# Patient Record
Sex: Female | Born: 1965
Health system: Southern US, Community
[De-identification: ages and names within clinical notes are randomized; demographics above are authoritative.]

## PROBLEM LIST (undated history)

## (undated) DIAGNOSIS — G4730 Sleep apnea, unspecified: Secondary | ICD-10-CM

## (undated) DIAGNOSIS — E785 Hyperlipidemia, unspecified: Secondary | ICD-10-CM

## (undated) DIAGNOSIS — R200 Anesthesia of skin: Secondary | ICD-10-CM

## (undated) DIAGNOSIS — F419 Anxiety disorder, unspecified: Secondary | ICD-10-CM

## (undated) DIAGNOSIS — J4520 Mild intermittent asthma, uncomplicated: Secondary | ICD-10-CM

## (undated) DIAGNOSIS — M545 Low back pain, unspecified: Secondary | ICD-10-CM

## (undated) DIAGNOSIS — J302 Other seasonal allergic rhinitis: Secondary | ICD-10-CM

## (undated) DIAGNOSIS — K219 Gastro-esophageal reflux disease without esophagitis: Secondary | ICD-10-CM

## (undated) DIAGNOSIS — R7303 Prediabetes: Secondary | ICD-10-CM

## (undated) DIAGNOSIS — F329 Major depressive disorder, single episode, unspecified: Secondary | ICD-10-CM

## (undated) DIAGNOSIS — G629 Polyneuropathy, unspecified: Secondary | ICD-10-CM

## (undated) DIAGNOSIS — M5431 Sciatica, right side: Secondary | ICD-10-CM

## (undated) DIAGNOSIS — I10 Essential (primary) hypertension: Secondary | ICD-10-CM

## (undated) DIAGNOSIS — M1990 Unspecified osteoarthritis, unspecified site: Secondary | ICD-10-CM

## (undated) DIAGNOSIS — G8929 Other chronic pain: Secondary | ICD-10-CM

## (undated) DIAGNOSIS — Z973 Presence of spectacles and contact lenses: Secondary | ICD-10-CM

## (undated) DIAGNOSIS — F32A Depression, unspecified: Secondary | ICD-10-CM

## (undated) DIAGNOSIS — J45909 Unspecified asthma, uncomplicated: Secondary | ICD-10-CM

## (undated) HISTORY — DX: Hyperlipidemia, unspecified: E78.5

## (undated) HISTORY — DX: Other seasonal allergic rhinitis: J30.2

## (undated) HISTORY — PX: SHOULDER ARTHROSCOPY: SHX128

---
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
## 1987-03-12 HISTORY — PX: ABDOMINAL HERNIA REPAIR: SHX539

## 1987-03-12 HISTORY — PX: HERNIA REPAIR: SHX51

## 1995-03-12 HISTORY — PX: TUBAL LIGATION: SHX77

## 1997-10-05 ENCOUNTER — Emergency Department (HOSPITAL_COMMUNITY): Admission: EM | Admit: 1997-10-05 | Discharge: 1997-10-05 | Payer: Self-pay | Admitting: Emergency Medicine

## 1998-02-07 ENCOUNTER — Encounter: Payer: Self-pay | Admitting: Emergency Medicine

## 1998-02-07 ENCOUNTER — Emergency Department (HOSPITAL_COMMUNITY): Admission: EM | Admit: 1998-02-07 | Discharge: 1998-02-07 | Payer: Self-pay | Admitting: Emergency Medicine

## 1998-07-06 ENCOUNTER — Other Ambulatory Visit: Admission: RE | Admit: 1998-07-06 | Discharge: 1998-07-06 | Payer: Self-pay | Admitting: Obstetrics

## 1999-05-17 ENCOUNTER — Emergency Department (HOSPITAL_COMMUNITY): Admission: EM | Admit: 1999-05-17 | Discharge: 1999-05-17 | Payer: Self-pay | Admitting: *Deleted

## 2000-02-10 ENCOUNTER — Emergency Department (HOSPITAL_COMMUNITY): Admission: EM | Admit: 2000-02-10 | Discharge: 2000-02-10 | Payer: Self-pay | Admitting: Emergency Medicine

## 2000-02-13 ENCOUNTER — Emergency Department (HOSPITAL_COMMUNITY): Admission: EM | Admit: 2000-02-13 | Discharge: 2000-02-13 | Payer: Self-pay | Admitting: Emergency Medicine

## 2000-04-08 ENCOUNTER — Encounter: Admission: RE | Admit: 2000-04-08 | Discharge: 2000-04-25 | Payer: Self-pay | Admitting: Occupational Medicine

## 2001-08-26 ENCOUNTER — Emergency Department (HOSPITAL_COMMUNITY): Admission: EM | Admit: 2001-08-26 | Discharge: 2001-08-26 | Payer: Self-pay | Admitting: Emergency Medicine

## 2001-08-26 ENCOUNTER — Encounter: Payer: Self-pay | Admitting: Emergency Medicine

## 2001-08-28 ENCOUNTER — Emergency Department (HOSPITAL_COMMUNITY): Admission: EM | Admit: 2001-08-28 | Discharge: 2001-08-28 | Payer: Self-pay | Admitting: Emergency Medicine

## 2003-03-25 ENCOUNTER — Emergency Department (HOSPITAL_COMMUNITY): Admission: EM | Admit: 2003-03-25 | Discharge: 2003-03-25 | Payer: Self-pay | Admitting: Emergency Medicine

## 2003-10-27 ENCOUNTER — Encounter: Admission: RE | Admit: 2003-10-27 | Discharge: 2003-10-27 | Payer: Self-pay | Admitting: Internal Medicine

## 2004-01-09 ENCOUNTER — Emergency Department (HOSPITAL_COMMUNITY): Admission: EM | Admit: 2004-01-09 | Discharge: 2004-01-09 | Payer: Self-pay | Admitting: Emergency Medicine

## 2004-01-09 ENCOUNTER — Ambulatory Visit: Payer: Self-pay | Admitting: *Deleted

## 2004-01-12 ENCOUNTER — Ambulatory Visit: Payer: Self-pay | Admitting: Internal Medicine

## 2004-02-09 ENCOUNTER — Ambulatory Visit: Payer: Self-pay | Admitting: Internal Medicine

## 2004-07-25 ENCOUNTER — Ambulatory Visit: Payer: Self-pay | Admitting: Family Medicine

## 2004-07-27 ENCOUNTER — Ambulatory Visit: Payer: Self-pay | Admitting: Family Medicine

## 2004-08-03 ENCOUNTER — Ambulatory Visit: Payer: Self-pay | Admitting: Family Medicine

## 2004-08-03 ENCOUNTER — Other Ambulatory Visit: Admission: RE | Admit: 2004-08-03 | Discharge: 2004-08-03 | Payer: Self-pay | Admitting: Family Medicine

## 2004-11-17 ENCOUNTER — Emergency Department (HOSPITAL_COMMUNITY): Admission: EM | Admit: 2004-11-17 | Discharge: 2004-11-17 | Payer: Self-pay | Admitting: Emergency Medicine

## 2004-12-04 ENCOUNTER — Ambulatory Visit: Payer: Self-pay | Admitting: Family Medicine

## 2004-12-11 ENCOUNTER — Emergency Department (HOSPITAL_COMMUNITY): Admission: EM | Admit: 2004-12-11 | Discharge: 2004-12-11 | Payer: Self-pay | Admitting: Emergency Medicine

## 2004-12-12 ENCOUNTER — Ambulatory Visit: Payer: Self-pay | Admitting: Family Medicine

## 2005-03-22 ENCOUNTER — Ambulatory Visit: Payer: Self-pay | Admitting: Family Medicine

## 2005-04-22 ENCOUNTER — Ambulatory Visit: Payer: Self-pay | Admitting: Family Medicine

## 2005-05-09 ENCOUNTER — Ambulatory Visit: Payer: Self-pay | Admitting: Family Medicine

## 2005-05-13 ENCOUNTER — Ambulatory Visit: Payer: Self-pay | Admitting: Family Medicine

## 2005-05-24 ENCOUNTER — Ambulatory Visit: Payer: Self-pay | Admitting: Family Medicine

## 2005-06-28 ENCOUNTER — Ambulatory Visit: Payer: Self-pay | Admitting: Internal Medicine

## 2005-07-30 ENCOUNTER — Ambulatory Visit: Payer: Self-pay | Admitting: Family Medicine

## 2005-08-22 ENCOUNTER — Ambulatory Visit: Payer: Self-pay | Admitting: Family Medicine

## 2005-08-22 ENCOUNTER — Encounter: Payer: Self-pay | Admitting: Family Medicine

## 2005-09-03 ENCOUNTER — Encounter: Admission: RE | Admit: 2005-09-03 | Discharge: 2005-09-03 | Payer: Self-pay | Admitting: Family Medicine

## 2005-10-22 ENCOUNTER — Ambulatory Visit: Payer: Self-pay | Admitting: Family Medicine

## 2005-10-26 ENCOUNTER — Emergency Department (HOSPITAL_COMMUNITY): Admission: EM | Admit: 2005-10-26 | Discharge: 2005-10-26 | Payer: Self-pay | Admitting: Emergency Medicine

## 2006-01-29 ENCOUNTER — Ambulatory Visit: Payer: Self-pay | Admitting: Family Medicine

## 2006-04-01 ENCOUNTER — Ambulatory Visit: Payer: Self-pay | Admitting: Family Medicine

## 2006-04-25 ENCOUNTER — Ambulatory Visit (HOSPITAL_COMMUNITY): Admission: RE | Admit: 2006-04-25 | Discharge: 2006-04-25 | Payer: Self-pay | Admitting: Family Medicine

## 2006-10-21 ENCOUNTER — Emergency Department (HOSPITAL_COMMUNITY): Admission: EM | Admit: 2006-10-21 | Discharge: 2006-10-22 | Payer: Self-pay | Admitting: Emergency Medicine

## 2006-10-23 ENCOUNTER — Ambulatory Visit: Payer: Self-pay | Admitting: Family Medicine

## 2006-11-03 ENCOUNTER — Ambulatory Visit: Payer: Self-pay | Admitting: Family Medicine

## 2006-11-03 ENCOUNTER — Telehealth (INDEPENDENT_AMBULATORY_CARE_PROVIDER_SITE_OTHER): Payer: Self-pay | Admitting: Family Medicine

## 2006-11-03 DIAGNOSIS — E785 Hyperlipidemia, unspecified: Secondary | ICD-10-CM | POA: Insufficient documentation

## 2006-11-03 DIAGNOSIS — I10 Essential (primary) hypertension: Secondary | ICD-10-CM

## 2006-11-03 DIAGNOSIS — F411 Generalized anxiety disorder: Secondary | ICD-10-CM | POA: Insufficient documentation

## 2006-11-03 DIAGNOSIS — J45909 Unspecified asthma, uncomplicated: Secondary | ICD-10-CM

## 2006-11-03 LAB — CONVERTED CEMR LAB
ALT: 10 units/L (ref 0–35)
AST: 13 units/L (ref 0–37)
Albumin: 4.3 g/dL (ref 3.5–5.2)
Alkaline Phosphatase: 68 units/L (ref 39–117)
Basophils Absolute: 0.1 10*3/uL (ref 0.0–0.1)
Calcium: 9.9 mg/dL (ref 8.4–10.5)
Chloride: 104 meq/L (ref 96–112)
Creatinine, Ser: 0.8 mg/dL (ref 0.40–1.20)
Eosinophils Absolute: 0.2 10*3/uL (ref 0.0–0.7)
Eosinophils Relative: 3 % (ref 0–5)
Glucose, Bld: 80 mg/dL (ref 70–99)
HCT: 40.2 % (ref 36.0–46.0)
Monocytes Absolute: 0.5 10*3/uL (ref 0.2–0.7)
Monocytes Relative: 9 % (ref 3–11)
Neutrophils Relative %: 36 % — ABNORMAL LOW (ref 43–77)
Platelets: 362 10*3/uL (ref 150–400)
Sodium: 142 meq/L (ref 135–145)
TSH: 0.605 microintl units/mL (ref 0.350–5.50)
Total Protein: 7.3 g/dL (ref 6.0–8.3)

## 2006-11-04 ENCOUNTER — Telehealth (INDEPENDENT_AMBULATORY_CARE_PROVIDER_SITE_OTHER): Payer: Self-pay | Admitting: Family Medicine

## 2006-11-26 ENCOUNTER — Encounter (INDEPENDENT_AMBULATORY_CARE_PROVIDER_SITE_OTHER): Payer: Self-pay | Admitting: *Deleted

## 2006-12-04 ENCOUNTER — Encounter (INDEPENDENT_AMBULATORY_CARE_PROVIDER_SITE_OTHER): Payer: Self-pay | Admitting: Nurse Practitioner

## 2006-12-04 ENCOUNTER — Ambulatory Visit: Payer: Self-pay | Admitting: Family Medicine

## 2006-12-04 ENCOUNTER — Telehealth (INDEPENDENT_AMBULATORY_CARE_PROVIDER_SITE_OTHER): Payer: Self-pay | Admitting: *Deleted

## 2006-12-04 DIAGNOSIS — N390 Urinary tract infection, site not specified: Secondary | ICD-10-CM

## 2006-12-04 LAB — CONVERTED CEMR LAB: Bilirubin Urine: NEGATIVE

## 2006-12-05 ENCOUNTER — Encounter (INDEPENDENT_AMBULATORY_CARE_PROVIDER_SITE_OTHER): Payer: Self-pay | Admitting: Nurse Practitioner

## 2007-01-07 ENCOUNTER — Telehealth (INDEPENDENT_AMBULATORY_CARE_PROVIDER_SITE_OTHER): Payer: Self-pay | Admitting: Family Medicine

## 2007-01-20 ENCOUNTER — Ambulatory Visit: Payer: Self-pay | Admitting: Family Medicine

## 2007-01-20 DIAGNOSIS — R05 Cough: Secondary | ICD-10-CM

## 2007-01-20 LAB — CONVERTED CEMR LAB
Glucose, Urine, Semiquant: NEGATIVE
Protein, U semiquant: NEGATIVE
WBC Urine, dipstick: NEGATIVE

## 2007-01-21 ENCOUNTER — Ambulatory Visit (HOSPITAL_COMMUNITY): Admission: RE | Admit: 2007-01-21 | Discharge: 2007-01-21 | Payer: Self-pay | Admitting: Family Medicine

## 2007-01-30 ENCOUNTER — Ambulatory Visit (HOSPITAL_COMMUNITY): Admission: RE | Admit: 2007-01-30 | Discharge: 2007-01-30 | Payer: Self-pay | Admitting: Family Medicine

## 2007-02-03 ENCOUNTER — Encounter (INDEPENDENT_AMBULATORY_CARE_PROVIDER_SITE_OTHER): Payer: Self-pay | Admitting: Family Medicine

## 2007-02-03 ENCOUNTER — Ambulatory Visit: Payer: Self-pay | Admitting: Family Medicine

## 2007-02-03 DIAGNOSIS — N760 Acute vaginitis: Secondary | ICD-10-CM | POA: Insufficient documentation

## 2007-02-03 LAB — CONVERTED CEMR LAB
Bilirubin Urine: NEGATIVE
Blood in Urine, dipstick: NEGATIVE
GC Probe Amp, Genital: NEGATIVE
Glucose, Urine, Semiquant: NEGATIVE
Ketones, urine, test strip: NEGATIVE
WBC Urine, dipstick: NEGATIVE

## 2007-02-11 ENCOUNTER — Encounter (INDEPENDENT_AMBULATORY_CARE_PROVIDER_SITE_OTHER): Payer: Self-pay | Admitting: Family Medicine

## 2007-05-13 ENCOUNTER — Ambulatory Visit: Payer: Self-pay | Admitting: Family Medicine

## 2007-05-13 ENCOUNTER — Telehealth (INDEPENDENT_AMBULATORY_CARE_PROVIDER_SITE_OTHER): Payer: Self-pay | Admitting: Family Medicine

## 2007-05-13 DIAGNOSIS — M940 Chondrocostal junction syndrome [Tietze]: Secondary | ICD-10-CM | POA: Insufficient documentation

## 2007-08-06 ENCOUNTER — Telehealth (INDEPENDENT_AMBULATORY_CARE_PROVIDER_SITE_OTHER): Payer: Self-pay | Admitting: *Deleted

## 2007-08-07 ENCOUNTER — Ambulatory Visit: Payer: Self-pay | Admitting: Internal Medicine

## 2007-08-07 LAB — CONVERTED CEMR LAB
Bilirubin Urine: NEGATIVE
Ketones, urine, test strip: NEGATIVE
Nitrite: NEGATIVE
Protein, U semiquant: NEGATIVE
Specific Gravity, Urine: <1.005

## 2007-08-08 ENCOUNTER — Encounter (INDEPENDENT_AMBULATORY_CARE_PROVIDER_SITE_OTHER): Payer: Self-pay | Admitting: Internal Medicine

## 2007-08-20 ENCOUNTER — Telehealth (INDEPENDENT_AMBULATORY_CARE_PROVIDER_SITE_OTHER): Payer: Self-pay | Admitting: *Deleted

## 2007-09-25 ENCOUNTER — Telehealth (INDEPENDENT_AMBULATORY_CARE_PROVIDER_SITE_OTHER): Payer: Self-pay | Admitting: *Deleted

## 2007-10-12 ENCOUNTER — Telehealth (INDEPENDENT_AMBULATORY_CARE_PROVIDER_SITE_OTHER): Payer: Self-pay | Admitting: *Deleted

## 2007-11-09 ENCOUNTER — Ambulatory Visit: Payer: Self-pay | Admitting: Family Medicine

## 2007-11-09 DIAGNOSIS — M76899 Other specified enthesopathies of unspecified lower limb, excluding foot: Secondary | ICD-10-CM | POA: Insufficient documentation

## 2007-11-09 DIAGNOSIS — J209 Acute bronchitis, unspecified: Secondary | ICD-10-CM

## 2007-11-25 ENCOUNTER — Encounter (INDEPENDENT_AMBULATORY_CARE_PROVIDER_SITE_OTHER): Payer: Self-pay | Admitting: Family Medicine

## 2007-11-25 ENCOUNTER — Telehealth (INDEPENDENT_AMBULATORY_CARE_PROVIDER_SITE_OTHER): Payer: Self-pay | Admitting: Family Medicine

## 2007-11-27 ENCOUNTER — Telehealth (INDEPENDENT_AMBULATORY_CARE_PROVIDER_SITE_OTHER): Payer: Self-pay | Admitting: *Deleted

## 2007-12-09 ENCOUNTER — Encounter (INDEPENDENT_AMBULATORY_CARE_PROVIDER_SITE_OTHER): Payer: Self-pay | Admitting: *Deleted

## 2007-12-16 ENCOUNTER — Telehealth (INDEPENDENT_AMBULATORY_CARE_PROVIDER_SITE_OTHER): Payer: Self-pay | Admitting: *Deleted

## 2007-12-29 ENCOUNTER — Ambulatory Visit: Payer: Self-pay | Admitting: Family Medicine

## 2008-02-22 ENCOUNTER — Encounter (INDEPENDENT_AMBULATORY_CARE_PROVIDER_SITE_OTHER): Payer: Self-pay | Admitting: Family Medicine

## 2008-02-22 ENCOUNTER — Ambulatory Visit: Payer: Self-pay | Admitting: Family Medicine

## 2008-02-22 DIAGNOSIS — A5901 Trichomonal vulvovaginitis: Secondary | ICD-10-CM

## 2008-02-22 LAB — CONVERTED CEMR LAB
Bilirubin Urine: NEGATIVE
Blood in Urine, dipstick: NEGATIVE
Glucose, Urine, Semiquant: NEGATIVE
Ketones, urine, test strip: NEGATIVE
Nitrite: NEGATIVE

## 2008-02-23 ENCOUNTER — Encounter (INDEPENDENT_AMBULATORY_CARE_PROVIDER_SITE_OTHER): Payer: Self-pay | Admitting: Family Medicine

## 2008-02-23 LAB — CONVERTED CEMR LAB
Chlamydia, DNA Probe: NEGATIVE
GC Probe Amp, Genital: NEGATIVE

## 2008-03-05 ENCOUNTER — Encounter (INDEPENDENT_AMBULATORY_CARE_PROVIDER_SITE_OTHER): Payer: Self-pay | Admitting: Family Medicine

## 2008-03-15 ENCOUNTER — Ambulatory Visit (HOSPITAL_COMMUNITY): Admission: RE | Admit: 2008-03-15 | Discharge: 2008-03-15 | Payer: Self-pay | Admitting: Family Medicine

## 2008-03-29 ENCOUNTER — Telehealth (INDEPENDENT_AMBULATORY_CARE_PROVIDER_SITE_OTHER): Payer: Self-pay | Admitting: *Deleted

## 2008-04-04 ENCOUNTER — Telehealth (INDEPENDENT_AMBULATORY_CARE_PROVIDER_SITE_OTHER): Payer: Self-pay | Admitting: Family Medicine

## 2008-04-06 ENCOUNTER — Ambulatory Visit: Payer: Self-pay | Admitting: Family Medicine

## 2008-08-30 ENCOUNTER — Ambulatory Visit: Payer: Self-pay | Admitting: Internal Medicine

## 2008-08-30 DIAGNOSIS — M545 Low back pain, unspecified: Secondary | ICD-10-CM | POA: Insufficient documentation

## 2008-08-30 DIAGNOSIS — E669 Obesity, unspecified: Secondary | ICD-10-CM

## 2008-09-09 ENCOUNTER — Encounter: Admission: RE | Admit: 2008-09-09 | Discharge: 2008-12-08 | Payer: Self-pay | Admitting: Internal Medicine

## 2008-09-14 ENCOUNTER — Ambulatory Visit: Payer: Self-pay | Admitting: Internal Medicine

## 2008-09-22 ENCOUNTER — Encounter (INDEPENDENT_AMBULATORY_CARE_PROVIDER_SITE_OTHER): Payer: Self-pay | Admitting: Internal Medicine

## 2008-09-27 ENCOUNTER — Encounter (INDEPENDENT_AMBULATORY_CARE_PROVIDER_SITE_OTHER): Payer: Self-pay | Admitting: Internal Medicine

## 2008-09-27 ENCOUNTER — Encounter: Admission: RE | Admit: 2008-09-27 | Discharge: 2008-11-23 | Payer: Self-pay | Admitting: Internal Medicine

## 2008-10-26 ENCOUNTER — Encounter (INDEPENDENT_AMBULATORY_CARE_PROVIDER_SITE_OTHER): Payer: Self-pay | Admitting: Internal Medicine

## 2008-11-29 ENCOUNTER — Encounter (INDEPENDENT_AMBULATORY_CARE_PROVIDER_SITE_OTHER): Payer: Self-pay | Admitting: Internal Medicine

## 2008-12-06 ENCOUNTER — Encounter: Admission: RE | Admit: 2008-12-06 | Discharge: 2009-03-06 | Payer: Self-pay | Admitting: Internal Medicine

## 2008-12-19 ENCOUNTER — Encounter (INDEPENDENT_AMBULATORY_CARE_PROVIDER_SITE_OTHER): Payer: Self-pay | Admitting: Internal Medicine

## 2009-03-17 ENCOUNTER — Emergency Department (HOSPITAL_COMMUNITY): Admission: EM | Admit: 2009-03-17 | Discharge: 2009-03-17 | Payer: Self-pay | Admitting: Family Medicine

## 2009-03-17 ENCOUNTER — Telehealth: Payer: Self-pay | Admitting: Physician Assistant

## 2009-03-22 ENCOUNTER — Encounter (INDEPENDENT_AMBULATORY_CARE_PROVIDER_SITE_OTHER): Payer: Self-pay | Admitting: *Deleted

## 2009-05-09 ENCOUNTER — Ambulatory Visit: Payer: Self-pay | Admitting: Physician Assistant

## 2009-05-09 DIAGNOSIS — J069 Acute upper respiratory infection, unspecified: Secondary | ICD-10-CM | POA: Insufficient documentation

## 2009-05-09 LAB — CONVERTED CEMR LAB
CO2: 27 meq/L (ref 19–32)
Glucose, Bld: 71 mg/dL (ref 70–99)

## 2009-05-11 ENCOUNTER — Encounter: Payer: Self-pay | Admitting: Physician Assistant

## 2009-06-12 ENCOUNTER — Ambulatory Visit: Payer: Self-pay | Admitting: Physician Assistant

## 2009-06-12 DIAGNOSIS — J309 Allergic rhinitis, unspecified: Secondary | ICD-10-CM | POA: Insufficient documentation

## 2009-06-12 DIAGNOSIS — J302 Other seasonal allergic rhinitis: Secondary | ICD-10-CM | POA: Insufficient documentation

## 2009-06-12 LAB — CONVERTED CEMR LAB: LDL Goal: 130 mg/dL

## 2009-06-29 ENCOUNTER — Ambulatory Visit: Payer: Self-pay | Admitting: Physician Assistant

## 2009-07-05 LAB — CONVERTED CEMR LAB
CO2: 26 meq/L (ref 19–32)
Chloride: 105 meq/L (ref 96–112)
Creatinine, Ser: 0.62 mg/dL (ref 0.40–1.20)
Glucose, Bld: 89 mg/dL (ref 70–99)

## 2009-09-13 ENCOUNTER — Ambulatory Visit: Payer: Self-pay | Admitting: Physician Assistant

## 2009-09-13 DIAGNOSIS — N920 Excessive and frequent menstruation with regular cycle: Secondary | ICD-10-CM

## 2009-09-13 DIAGNOSIS — F329 Major depressive disorder, single episode, unspecified: Secondary | ICD-10-CM

## 2009-09-13 LAB — CONVERTED CEMR LAB
Bilirubin Urine: NEGATIVE
Blood in Urine, dipstick: NEGATIVE
KOH Prep: NEGATIVE
Ketones, urine, test strip: NEGATIVE
Pap Smear: NEGATIVE
Urobilinogen, UA: 0.2
WBC Urine, dipstick: NEGATIVE
Whiff Test: POSITIVE
pH: 7.5

## 2009-09-14 LAB — CONVERTED CEMR LAB
ALT: 13 units/L (ref 0–35)
AST: 16 units/L (ref 0–37)
Albumin: 4.4 g/dL (ref 3.5–5.2)
BUN: 11 mg/dL (ref 6–23)
Basophils Relative: 1 % (ref 0–1)
Chloride: 103 meq/L (ref 96–112)
Creatinine, Ser: 0.56 mg/dL (ref 0.40–1.20)
Glucose, Bld: 82 mg/dL (ref 70–99)
Hemoglobin: 13.5 g/dL (ref 12.0–15.0)
MCV: 83.1 fL (ref 78.0–100.0)
Potassium: 3.4 meq/L — ABNORMAL LOW (ref 3.5–5.3)
RBC: 4.73 M/uL (ref 3.87–5.11)
Sodium: 140 meq/L (ref 135–145)
TSH: 0.578 microintl units/mL (ref 0.350–4.500)
Total Bilirubin: 0.4 mg/dL (ref 0.3–1.2)

## 2009-09-19 ENCOUNTER — Encounter: Payer: Self-pay | Admitting: Physician Assistant

## 2009-09-27 ENCOUNTER — Ambulatory Visit (HOSPITAL_COMMUNITY): Admission: RE | Admit: 2009-09-27 | Discharge: 2009-09-27 | Payer: Self-pay | Admitting: Internal Medicine

## 2009-09-28 ENCOUNTER — Ambulatory Visit: Payer: Self-pay | Admitting: Physician Assistant

## 2009-10-10 ENCOUNTER — Ambulatory Visit: Payer: Self-pay | Admitting: Physician Assistant

## 2009-10-10 ENCOUNTER — Ambulatory Visit: Payer: Self-pay | Admitting: Nurse Practitioner

## 2009-10-10 DIAGNOSIS — K219 Gastro-esophageal reflux disease without esophagitis: Secondary | ICD-10-CM | POA: Insufficient documentation

## 2009-10-13 LAB — CONVERTED CEMR LAB
Calcium: 10.2 mg/dL (ref 8.4–10.5)
LDL Cholesterol: 192 mg/dL — ABNORMAL HIGH (ref 0–99)
Sodium: 140 meq/L (ref 135–145)
Total CHOL/HDL Ratio: 5
Triglycerides: 188 mg/dL — ABNORMAL HIGH (ref <150)

## 2009-10-15 ENCOUNTER — Encounter: Payer: Self-pay | Admitting: Physician Assistant

## 2010-01-16 ENCOUNTER — Telehealth (INDEPENDENT_AMBULATORY_CARE_PROVIDER_SITE_OTHER): Payer: Self-pay | Admitting: Internal Medicine

## 2010-01-23 ENCOUNTER — Ambulatory Visit: Payer: Self-pay | Admitting: Internal Medicine

## 2010-02-02 ENCOUNTER — Ambulatory Visit (HOSPITAL_COMMUNITY): Admission: RE | Admit: 2010-02-02 | Discharge: 2010-02-02 | Payer: Self-pay | Admitting: Internal Medicine

## 2010-02-07 DIAGNOSIS — M25569 Pain in unspecified knee: Secondary | ICD-10-CM

## 2010-02-14 ENCOUNTER — Telehealth (INDEPENDENT_AMBULATORY_CARE_PROVIDER_SITE_OTHER): Payer: Self-pay | Admitting: *Deleted

## 2010-02-14 ENCOUNTER — Ambulatory Visit (HOSPITAL_COMMUNITY): Admission: RE | Admit: 2010-02-14 | Payer: Self-pay | Admitting: Internal Medicine

## 2010-02-28 ENCOUNTER — Ambulatory Visit (HOSPITAL_COMMUNITY)
Admission: RE | Admit: 2010-02-28 | Discharge: 2010-02-28 | Payer: Self-pay | Source: Home / Self Care | Attending: Internal Medicine | Admitting: Internal Medicine

## 2010-04-01 ENCOUNTER — Encounter: Payer: Self-pay | Admitting: Internal Medicine

## 2010-04-10 ENCOUNTER — Encounter (INDEPENDENT_AMBULATORY_CARE_PROVIDER_SITE_OTHER): Payer: Self-pay | Admitting: Internal Medicine

## 2010-04-10 NOTE — Letter (Signed)
Summary: *HSN Results Follow up  [facility]  [address] [place], [place] [address]   Phone: [phone]  Fax: [phone]      03/22/2009   SUNDUS PETE [address] [place], [place]  [address]   Dear  Ms. Lyrika [name],                            ____S.Drinkard,FNP   ____D. Gore,FNP       ____B. McPherson,MD   ____V. Rankins,MD    ____E. Mulberry,MD    ____N. Daphine Deutscher, FNP  ____D. Reche Dixon, MD    ____K. Philipp Deputy, MD    ____Other     This letter is to inform you that your recent test(s):  _______Pap Smear    _______Lab Test     _______X-ray    _______ is within acceptable limits  _______ requires a medication change  _______ requires a follow-up lab visit  _______ requires a follow-up visit with your provider   Comments:  We have been trying to reach you.  Please give [place] a call at your earliest convenience.       _________________________________________________________ If you have any questions, please contact our office                     Sincerely,  Armenia Shannon [facility]

## 2010-04-10 NOTE — Assessment & Plan Note (Signed)
Summary: HAS HAD ACOLD FOR 2 WEEKS/COUGH//SS   Vital Signs:  Patient profile:   45 year old female Height:      61 inches Weight:      204 pounds BMI:     38.68 Temp:     98.6 degrees F oral Pulse rate:   93 / minute Pulse rhythm:   regular Resp:     17 per minute BP sitting:   140 / 90  (left arm) Cuff size:   regular  Vitals Entered By: [place] Shannon (May 09, 2009 3:25 PM) CC: pt is here for cold symtoms.... pt says she coughs alot at night and is unable to sleep because of the cough..... pt says she has a head cold also.... pt says she has had this cold for two weeks....    pt says she has used OTC meds.... pt wants flu shot.. Is Patient Diabetic? No Pain Assessment Patient in pain? no       Does patient need assistance? Functional Status Self care Ambulation Normal   Primary Care Provider:  Tereso Newcomer PA-C  CC:  pt is here for cold symtoms.... pt says she coughs alot at night and is unable to sleep because of the cough..... pt says she has a head cold also.... pt says she has had this cold for two weeks....    pt says she has used OTC meds.... pt wants flu shot...  History of Present Illness: Here for URI symptoms x2 weeks +. Previous patient of Dr. Barbaraann Barthel. First meeting.  Works with children. Had viral gastroenteritis 2 weeks ago with URI symptoms. Has been coughing since then. Inhaler does not help. Cough more at night. Sputum yellow. No hemoptysis. No fevers.  No chills. + headaches + frontal sinus pressure on left; took ibuprofen and this is better + dental  + lightheadedness + decreased taste and smell throat sore from coughing  Asthma really no worse since getting sick.    Asthma History    Initial Asthma Severity Rating:    Age range: 12+ years    Symptoms: 0-2 days/week    Nighttime Awakenings: 0-2/month    Interferes w/ normal activity: minor limitations    SABA use (not for EIB): daily    Asthma Severity Assessment: Moderate  Persistent   Problems Prior to Update: 1)  Uri  (ICD-465.9) 2)  Obesity  (ICD-278.00) 3)  Low Back Pain Syndrome  (ICD-724.2) 4)  Screening For Malignant Neoplasm, Cervix  (ICD-V76.2) 5)  Examination, Routine Medical  (ICD-V70.0) 6)  Trichomonal Vaginitis  (ICD-131.01) 7)  Unspecified Breast Screening  (ICD-V76.10) 8)  Acute Bronchitis  (ICD-466.0) 9)  Trochanteric Bursitis, Right  (ICD-726.5) 10)  Uti  (ICD-599.0) 11)  Costochondritis  (ICD-733.6) 12)  Routine Gynecological Examination  (ICD-V72.31) 13)  Bacterial Vaginitis  (ICD-616.10) 14)  Cough  (ICD-786.2) 15)  Infection, Urinary Tract Nos  (ICD-599.0) 16)  Hyperlipidemia  (ICD-272.4) 17)  Hypertension  (ICD-401.9) 18)  Asthma  (ICD-493.90) 19)  Anxiety  (ICD-300.00)  Current Medications (verified): 1)  Benazepril-Hydrochlorothiazide 10-12.5 Mg  Tabs (Benazepril-Hydrochlorothiazide) .Marland Kitchen.. 1 By Mouth Once Daily 2)  Alprazolam 0.5 Mg  Tb24 (Alprazolam) .Marland Kitchen.. 1 To 2 Tabs By Mouth As Needed(Per [facility] or Dr.mckenzie) 3)  Allegra 180 Mg  Tabs (Fexofenadine Hcl) .Marland Kitchen.. 1 By Mouth Once Daily 4)  Nasacort Aq 55 Mcg/act  Aers (Triamcinolone Acetonide(Nasal)) .... Daily 5)  Albuterol 90 Mcg/act  Aers (Albuterol) .... Take 2 Puffs Every 4 To 6 Hours As Needed Wheezing/cough  6)  Flexeril 10 Mg Tabs (Cyclobenzaprine Hcl) .... Take 1 Tablet By Mouth Every 8 Hours As Needed Muscle Spasm 7)  Naprosyn 500 Mg Tabs (Naproxen) .Marland Kitchen.. 1 Two Times A Day As Needed Pain Take With Food 8)  Albuterol Sulfate (2.5 Mg/56ml) 0.083% Nebu (Albuterol Sulfate) .... Use One Ampule in Nebulizer Every 4-6 Hours As Needed For Cough/ Wheezing 9)  Metronidazole 500 Mg Tabs (Metronidazole) .... Take 1 Tablet By Mouth Every 12 Hours X 5 Days  Allergies (verified): No Known Drug Allergies  Past History:  Past Medical History: Last updated: 12/29/2007 insomnia Anxiety Asthma...normal PFTs in 2/08. Hypertension Hyperlipidemia   Impression &  Recommendations:  Problem # 1:  ASTHMA (ICD-493.90) add flovent her symptoms before her URI were consistent with mod persistent cont albuterol f/u in one month  Her updated medication list for this problem includes:    Proventil Hfa 108 (90 Base) Mcg/act Aers (Albuterol sulfate) .Marland Kitchen... 1-2 puffs every 4-6 hours as needed    Albuterol Sulfate (2.5 Mg/3ml) 0.083% Nebu (Albuterol sulfate) ..... Use one ampule in nebulizer every 4-6 hours as needed for cough/ wheezing    Flovent Hfa 110 Mcg/act Aero (Fluticasone propionate  hfa) .Marland Kitchen... 1 puff two times a day  Problem # 2:  URI (ICD-465.9) ? sinusitis will cover with zpak tessalon perles tussionex at bedtime add flovent as above  Her updated medication list for this problem includes:    Allegra 180 Mg Tabs (Fexofenadine hcl) .Marland Kitchen... 1 by mouth once daily    Naprosyn 500 Mg Tabs (Naproxen) .Marland Kitchen... 1 two times a day as needed pain take with food    Tessalon Perles 100 Mg Caps (Benzonatate) .Marland Kitchen... Take 1 tablet by mouth three times a day as needed for cough    Tussionex Pennkinetic Er 8-10 Mg/35ml Lqcr (Chlorpheniramine-hydrocodone) .Marland Kitchen... 1 tsp at bedtime as needed for cough  Problem # 3:  HYPERTENSION (ICD-401.9)  med increased in ED last month check bmet  Her updated medication list for this problem includes:    Benazepril-hydrochlorothiazide 20-12.5 Mg Tabs (Benazepril-hydrochlorothiazide) .Marland Kitchen... Take 1 tablet by mouth once a day  Orders: T-Basic Metabolic Panel [phone])  Complete Medication List: 1)  Benazepril-hydrochlorothiazide 20-12.5 Mg Tabs (Benazepril-hydrochlorothiazide) .... Take 1 tablet by mouth once a day 2)  Alprazolam 0.5 Mg Tb24 (Alprazolam) .Marland Kitchen.. 1 to 2 tabs by mouth as needed(per [facility] or dr.mckenzie) 3)  Allegra 180 Mg Tabs (Fexofenadine hcl) .Marland Kitchen.. 1 by mouth once daily 4)  Nasacort Aq 55 Mcg/act Aers (Triamcinolone acetonide(nasal)) .... Daily 5)  Proventil Hfa 108 (90 Base) Mcg/act Aers (Albuterol sulfate) .Marland Kitchen.. 1-2  puffs every 4-6 hours as needed 6)  Flexeril 10 Mg Tabs (Cyclobenzaprine hcl) .... Take 1 tablet by mouth every 8 hours as needed muscle spasm 7)  Naprosyn 500 Mg Tabs (Naproxen) .Marland Kitchen.. 1 two times a day as needed pain take with food 8)  Albuterol Sulfate (2.5 Mg/60ml) 0.083% Nebu (Albuterol sulfate) .... Use one ampule in nebulizer every 4-6 hours as needed for cough/ wheezing 9)  Metronidazole 500 Mg Tabs (Metronidazole) .... Take 1 tablet by mouth every 12 hours x 5 days 10)  Zithromax Z-pak 250 Mg Tabs (Azithromycin) .... Take as directed 11)  Tessalon Perles 100 Mg Caps (Benzonatate) .... Take 1 tablet by mouth three times a day as needed for cough 12)  Tussionex Pennkinetic Er 8-10 Mg/77ml Lqcr (Chlorpheniramine-hydrocodone) .Marland Kitchen.. 1 tsp at bedtime as needed for cough 13)  Flovent Hfa 110 Mcg/act Aero (Fluticasone propionate  hfa) .Marland KitchenMarland KitchenMarland Kitchen 1  puff two times a day  Patient Instructions: 1)  Please schedule a follow-up appointment in 1 month with Scott or sooner if needed.  2)  Take 650 - 1000 mg of tylenol every 4-6 hours as needed for relief of pain or comfort of fever. Avoid taking more than 4000 mg in a 24 hour period( can cause liver damage in higher doses).  3)  Rinse mouth out well after using the flovent. Prescriptions: PROVENTIL HFA 108 (90 BASE) MCG/ACT AERS (ALBUTEROL SULFATE) 1-2 puffs every 4-6 hours as needed  #1 x 11   Entered and Authorized by:   Tereso Newcomer PA-C   Signed by:   Tereso Newcomer PA-C on 05/09/2009   Method used:   Print then Give to Patient   RxID:   [id number] FLOVENT HFA 110 MCG/ACT AERO (FLUTICASONE PROPIONATE  HFA) 1 puff two times a day  #1 x 11   Entered and Authorized by:   Tereso Newcomer PA-C   Signed by:   Tereso Newcomer PA-C on 05/09/2009   Method used:   Print then Give to Patient   RxID:   [id number] TUSSIONEX PENNKINETIC ER 8-10 MG/5ML LQCR (CHLORPHENIRAMINE-HYDROCODONE) 1 tsp at bedtime as needed for cough  #50 mL x 0   Entered and Authorized by:    Tereso Newcomer PA-C   Signed by:   Tereso Newcomer PA-C on 05/09/2009   Method used:   Print then Give to Patient   RxID:   [id number] TESSALON PERLES 100 MG CAPS (BENZONATATE) Take 1 tablet by mouth three times a day as needed for cough  #30 x 0   Entered and Authorized by:   Tereso Newcomer PA-C   Signed by:   Tereso Newcomer PA-C on 05/09/2009   Method used:   Print then Give to Patient   RxID:   [id number] ZITHROMAX Z-PAK 250 MG TABS (AZITHROMYCIN) take as directed  #1 x 0   Entered and Authorized by:   Tereso Newcomer PA-C   Signed by:   Tereso Newcomer PA-C on 05/09/2009   Method used:   Print then Give to Patient   RxID:   [id number]

## 2010-04-10 NOTE — Letter (Signed)
Summary: Out of Work  [facility]  [address] [place], [place] [address]   Phone: [phone]  Fax: [phone]    May 09, 2009   Employee:  VETRA SHINALL    To Whom It May Concern:   For Medical reasons, please excuse the above named employee from work for the following dates:  Start:   May 09, 2009  End:   May 11, 2009  If you need additional information, please feel free to contact our office.         Sincerely,    Tereso Newcomer PA-C

## 2010-04-10 NOTE — Miscellaneous (Signed)
  Clinical Lists Changes  Problems: Assessed DEPRESSION as comment only - Deborah Knight saw Deborah Knight on 7.21.2011 Deborah Knight noted she would be open to trying an antidepressant  Her updated medication list for this problem includes:    Alprazolam 0.5 Mg Tb24 (Alprazolam) .Marland Kitchen... 1 to 2 tabs by mouth as needed(per [facility] or dr.mckenzie)        Impression & Recommendations:  Problem # 1:  DEPRESSION (ICD-311) Deborah Knight saw Deborah Knight on 7.21.2011 Deborah Knight noted she would be open to trying an antidepressant  Her updated medication list for this problem includes:    Alprazolam 0.5 Mg Tb24 (Alprazolam) .Marland Kitchen... 1 to 2 tabs by mouth as needed(per [facility] or dr.mckenzie)  Complete Medication List: 1)  Hyzaar 100-25 Mg Tabs (Losartan potassium-hctz) .... Take 1 tablet by mouth once a day for blood pressure (benazepril/hctz was d/c'd) 2)  Alprazolam 0.5 Mg Tb24 (Alprazolam) .Marland Kitchen.. 1 to 2 tabs by mouth as needed(per [facility] or dr.mckenzie) 3)  Zyrtec Hives Relief 10 Mg Tabs (Cetirizine hcl) .... Take 1 tablet by mouth once a day for allergies 4)  Nasacort Aq 55 Mcg/act Aers (Triamcinolone acetonide(nasal)) .... Daily 5)  Proventil Hfa 108 (90 Base) Mcg/act Aers (Albuterol sulfate) .Marland Kitchen.. 1-2 puffs every 4-6 hours as needed 6)  Flexeril 10 Mg Tabs (Cyclobenzaprine hcl) .... Take 1 tablet by mouth every 8 hours as needed muscle spasm 7)  Naprosyn 500 Mg Tabs (Naproxen) .Marland Kitchen.. 1 two times a day as needed pain take with food 8)  Albuterol Sulfate (2.5 Mg/61ml) 0.083% Nebu (Albuterol sulfate) .... Use one ampule in nebulizer every 4-6 hours as needed for cough/ wheezing 9)  Advair Diskus 250-50 Mcg/dose Aepb (Fluticasone-salmeterol) .Marland Kitchen.. 1 puff two times a day (pharmacy d/c flovent) 10)  Singulair 10 Mg Tabs (Montelukast sodium) .... Take 1 tablet by mouth once a day for allergies 11)  Pepcid 20 Mg Tabs (Famotidine) .... Take 1 tablet by mouth two times a day 12)  Pravastatin  Sodium 40 Mg Tabs (Pravastatin sodium) .... Take 1 tab by mouth at bedtime

## 2010-04-10 NOTE — Assessment & Plan Note (Signed)
Summary: one month f/u /tmm   Vital Signs:  Patient profile:   45 year old female Weight:      202.25 pounds BMI:     38.35 O2 Sat:      99 % Temp:     99.1 degrees F Pulse rate:   102 / minute Pulse rhythm:   regular Resp:     24 per minute BP sitting:   140 / 92  (left arm) Cuff size:   regular  Vitals Entered By: Chauncy Passy, SMA CC: Pt. is here for a 1 month f/u. Pt. states her meds for allergy/sinus is not working and would like more meds for muscle relaxers. Also, pt. started feeling sick yesterday night  w/vomitting. Also, has been constipated for a couple of months. , Hypertension Management, Lipid Management Is Patient Diabetic? No Pain Assessment Patient in pain? no       Does patient need assistance? Functional Status Self care Ambulation Normal Comments PF: 320 - 280 - 320   Primary Care Provider:  Tereso Newcomer PA-C  CC:  Pt. is here for a 1 month f/u. Pt. states her meds for allergy/sinus is not working and would like more meds for muscle relaxers. Also, pt. started feeling sick yesterday night  w/vomitting. Also, has been constipated for a couple of months. , Hypertension Management, and Lipid Management.  History of Present Illness: Here for f/u.  Asthma and allergies:  Notes sinus pressure in sinus on right.  Typical allergy symptoms.  Using nasacort and allegra.  + sneezing, + itchy/watery eyes.  Noted increased symptoms over last couple weeks when weather started to change.  Worse in evenings.  No fever or chills.  No dental pain.   Asthma History    Asthma Control Assessment:    Age range: 12+ years    Symptoms: 0-2 days/week    Nighttime Awakenings: 0-2/month    Interferes w/ normal activity: no limitations    SABA use (not for EIB): 0-2 days/week    Asthma Control Assessment: Well Controlled  Hypertension History:      She complains of headache, but denies chest pain, syncope, and side effects from treatment.        Positive major  cardiovascular risk factors include hyperlipidemia, hypertension, and family history for ischemic heart disease (males less than 65 years old).  Negative major cardiovascular risk factors include female age less than 39 years old and non-tobacco-user status.    Lipid Management History:      Positive NCEP/ATP III risk factors include family history for ischemic heart disease (males less than 18 years old) and hypertension.  Negative NCEP/ATP III risk factors include female age less than 74 years old and non-tobacco-user status.      Current Medications (verified): 1)  Benazepril-Hydrochlorothiazide 20-12.5 Mg Tabs (Benazepril-Hydrochlorothiazide) .... Take 1 Tablet By Mouth Once A Day 2)  Alprazolam 0.5 Mg  Tb24 (Alprazolam) .Marland Kitchen.. 1 To 2 Tabs By Mouth As Needed(Per [facility] or Dr.mckenzie) 3)  Allegra 180 Mg  Tabs (Fexofenadine Hcl) .Marland Kitchen.. 1 By Mouth Once Daily 4)  Nasacort Aq 55 Mcg/act  Aers (Triamcinolone Acetonide(Nasal)) .... Daily 5)  Proventil Hfa 108 (90 Base) Mcg/act Aers (Albuterol Sulfate) .Marland Kitchen.. 1-2 Puffs Every 4-6 Hours As Needed 6)  Flexeril 10 Mg Tabs (Cyclobenzaprine Hcl) .... Take 1 Tablet By Mouth Every 8 Hours As Needed Muscle Spasm 7)  Naprosyn 500 Mg Tabs (Naproxen) .Marland Kitchen.. 1 Two Times A Day As Needed Pain Take With Food 8)  Albuterol Sulfate (2.5 Mg/32ml) 0.083% Nebu (Albuterol Sulfate) .... Use One Ampule in Nebulizer Every 4-6 Hours As Needed For Cough/ Wheezing 9)  Metronidazole 500 Mg Tabs (Metronidazole) .... Take 1 Tablet By Mouth Every 12 Hours X 5 Days 10)  Zithromax Z-Pak 250 Mg Tabs (Azithromycin) .... Take As Directed 11)  Tessalon Perles 100 Mg Caps (Benzonatate) .... Take 1 Tablet By Mouth Three Times A Day As Needed For Cough 12)  Tussionex Pennkinetic Er 8-10 Mg/49ml Lqcr (Chlorpheniramine-Hydrocodone) .Marland Kitchen.. 1 Tsp At Bedtime As Needed For Cough 13)  Flovent Hfa 110 Mcg/act Aero (Fluticasone Propionate  Hfa) .Marland Kitchen.. 1 Puff Two Times A Day  Allergies (verified): No Known Drug  Allergies  Physical Exam  General:  alert, well-developed, and well-nourished.   Head:  normocephalic and atraumatic.   Eyes:  pupils equal, pupils round, and pupils reactive to light.   Neck:  supple.   Lungs:  normal breath sounds and no wheezes.   Heart:  normal rate and regular rhythm.   Neurologic:  alert & oriented X3 and cranial nerves II-XII intact.   Psych:  normally interactive.     Impression & Recommendations:  Problem # 1:  HYPERTENSION (ICD-401.9) change to benazepril hct 20/25 mg once daily repeat bmet in 2 weeks  Her updated medication list for this problem includes:    Benazepril-hydrochlorothiazide 20-25 Mg Tabs (Benazepril-hydrochlorothiazide) .Marland Kitchen... Take 1 tablet by mouth once a day  Problem # 2:  LOW BACK PAIN SYNDROME (ICD-724.2) chronic problem flexeril helps and she is out of meds has gone through PT  Her updated medication list for this problem includes:    Flexeril 10 Mg Tabs (Cyclobenzaprine hcl) .Marland Kitchen... Take 1 tablet by mouth every 8 hours as needed muscle spasm    Naprosyn 500 Mg Tabs (Naproxen) .Marland Kitchen... 1 two times a day as needed pain take with food  Problem # 3:  ASTHMA (ICD-493.90) better controlled continue current meds  Her updated medication list for this problem includes:    Proventil Hfa 108 (90 Base) Mcg/act Aers (Albuterol sulfate) .Marland Kitchen... 1-2 puffs every 4-6 hours as needed    Albuterol Sulfate (2.5 Mg/67ml) 0.083% Nebu (Albuterol sulfate) ..... Use one ampule in nebulizer every 4-6 hours as needed for cough/ wheezing    Flovent Hfa 110 Mcg/act Aero (Fluticasone propionate  hfa) .Marland Kitchen... 1 puff two times a day    Singulair 10 Mg Tabs (Montelukast sodium) .Marland Kitchen... Take 1 tablet by mouth once a day for allergies  Problem # 4:  ALLERGIC RHINITIS (ICD-477.9) add singulair  Her updated medication list for this problem includes:    Allegra 180 Mg Tabs (Fexofenadine hcl) .Marland Kitchen... 1 by mouth once daily    Nasacort Aq 55 Mcg/act Aers (Triamcinolone  acetonide(nasal)) .Marland Kitchen... Daily  Problem # 5:  HYPERLIPIDEMIA (ICD-272.4) check lipids at CPP  Problem # 6:  Preventive Health Care (ICD-V70.0) schedule cpp get Td, pneumovax and flu shot today  Complete Medication List: 1)  Benazepril-hydrochlorothiazide 20-25 Mg Tabs (Benazepril-hydrochlorothiazide) .... Take 1 tablet by mouth once a day 2)  Alprazolam 0.5 Mg Tb24 (Alprazolam) .Marland Kitchen.. 1 to 2 tabs by mouth as needed(per [facility] or dr.mckenzie) 3)  Allegra 180 Mg Tabs (Fexofenadine hcl) .Marland Kitchen.. 1 by mouth once daily 4)  Nasacort Aq 55 Mcg/act Aers (Triamcinolone acetonide(nasal)) .... Daily 5)  Proventil Hfa 108 (90 Base) Mcg/act Aers (Albuterol sulfate) .Marland Kitchen.. 1-2 puffs every 4-6 hours as needed 6)  Flexeril 10 Mg Tabs (Cyclobenzaprine hcl) .... Take 1 tablet by mouth every 8  hours as needed muscle spasm 7)  Naprosyn 500 Mg Tabs (Naproxen) .Marland Kitchen.. 1 two times a day as needed pain take with food 8)  Albuterol Sulfate (2.5 Mg/13ml) 0.083% Nebu (Albuterol sulfate) .... Use one ampule in nebulizer every 4-6 hours as needed for cough/ wheezing 9)  Metronidazole 500 Mg Tabs (Metronidazole) .... Take 1 tablet by mouth every 12 hours x 5 days 10)  Zithromax Z-pak 250 Mg Tabs (Azithromycin) .... Take as directed 11)  Tessalon Perles 100 Mg Caps (Benzonatate) .... Take 1 tablet by mouth three times a day as needed for cough 12)  Tussionex Pennkinetic Er 8-10 Mg/74ml Lqcr (Chlorpheniramine-hydrocodone) .Marland Kitchen.. 1 tsp at bedtime as needed for cough 13)  Flovent Hfa 110 Mcg/act Aero (Fluticasone propionate  hfa) .Marland Kitchen.. 1 puff two times a day 14)  Singulair 10 Mg Tabs (Montelukast sodium) .... Take 1 tablet by mouth once a day for allergies  Other Orders: Pneumococcal Vaccine (91478) Flu Vaccine 33yrs + (29562) Admin 1st Vaccine (13086) Admin of Any Addtl Vaccine (57846) Admin 1st Vaccine (State) [phone]) Admin of Any Addtl Vaccine (State) (84132G)  Hypertension Assessment/Plan:      The patient's hypertensive risk  group is category B: At least one risk factor (excluding diabetes) with no target organ damage.  Today's blood pressure is 140/92.  Her blood pressure goal is < 140/90.  Lipid Assessment/Plan:      Based on NCEP/ATP III, the patient's risk factor category is "0-1 risk factors".  The patient's lipid goals are as follows: Total cholesterol goal is 200; LDL cholesterol goal is 130; HDL cholesterol goal is 40; Triglyceride goal is 150.     Patient Instructions: 1)  Td shot and pneumovax today. 2)  Flu shot if we have it. 3)  Come in to the lab in 2 weeks for BMET (401.1). 4)  Schedule CPP with Linh Hedberg in 2 months.  Come fasting.  Do not eat or drink anything after midnight the night before except water. 5)  Use saline nose spray at separate times of the day apart from the nasacort. 6)  Try adding singulair to your medicines to see if this helps. 7)  Drink plenty of fluids. 8)  Use clear liquid diet for the next 1-2 days.  Then advance slowly with a BRAT diet (bananas, rice, applesauce and toast). Prescriptions: SINGULAIR 10 MG TABS (MONTELUKAST SODIUM) Take 1 tablet by mouth once a day for allergies  #30 x 5   Entered and Authorized by:   Tereso Newcomer PA-C   Signed by:   Tereso Newcomer PA-C on 06/12/2009   Method used:   Print then Give to Patient   RxID:   [id number] FLEXERIL 10 MG TABS (CYCLOBENZAPRINE HCL) Take 1 tablet by mouth every 8 hours as needed muscle spasm  #30 x 1   Entered and Authorized by:   Tereso Newcomer PA-C   Signed by:   Tereso Newcomer PA-C on 06/12/2009   Method used:   Print then Give to Patient   RxID:   [id number] BENAZEPRIL-HYDROCHLOROTHIAZIDE 20-25 MG TABS (BENAZEPRIL-HYDROCHLOROTHIAZIDE) Take 1 tablet by mouth once a day  #30 x 5   Entered and Authorized by:   Tereso Newcomer PA-C   Signed by:   Tereso Newcomer PA-C on 06/12/2009   Method used:   Print then Give to Patient   RxID:   [id number]    Pneumovax Vaccine    Vaccine Type: Pneumovax    Site:  right deltoid    Mfr: Merck  Dose: 0.5 ml    Route: IM    Given by: Chauncy Passy SMA    Exp. Date: 04/06/2010    Lot #: [id number]    VIS given: 10/07/95 version given June 12, 2009.  Influenza Vaccine    Vaccine Type: Fluvax 3+    Site: left deltoid    Mfr: Sanofi Pasteur    Dose: 0.5 ml    Route: IM    Given by: Chauncy Passy SMA    Exp. Date: 09/07/2009    Lot #: [id number]    VIS given: 10/02/06 version given June 12, 2009.  Flu Vaccine Consent Questions    Do you have a history of severe allergic reactions to this vaccine? no    Any prior history of allergic reactions to egg and/or gelatin? no    Do you have a sensitivity to the preservative Thimersol? no    Do you have a past history of Guillan-Barre Syndrome? no    Do you currently have an acute febrile illness? no    Have you ever had a severe reaction to latex? yes    Vaccine information given and explained to patient? yes    Are you currently pregnant? no

## 2010-04-10 NOTE — Progress Notes (Signed)
Summary: CYCLE IS HEAVY,LOWER BACK PAIN  Phone Note Call from Patient Call back at Home Phone [phone]   Reason for Call: Talk to Nurse Summary of Call: weaver pt. MS INGRAM SAYS THAT HER CYCLE STARTED OF 10/27 AND SHE IS STILL ON. THIS IS THE FIRST TIME THIS HAS HAPPENED, ITS HEAVY AND SHE IS CHANGING CONSTANTLY. SHE IS ALSO HAVING LOWER BACK PAINS DOWN TO HER TAIL BONE AND DOWN HER LEGS. Initial call taken by: Leodis Rains,  January 16, 2010 9:35 AM  Follow-up for Phone Call        Has been flowing since 10/27, has not slowed at all, changing about 6 pads/tampons per day.  Currently medium heavy, with some moderately-sized blood clots.  Has been hot flashes for about a year, but has never flowed like this.  Has had a couple of days where she has been feeling "kind of weak", but not like she's going to faint, denies nausea/vomiting.  Cycle usually lasts 5 days, with the longest at 7 days.  This flow is like her usual cycle, except that it's not ending.  Back/abdominal pain/cramping is still present. Follow-up by: Dutch Quint RN,  January 16, 2010 10:01 AM  Additional Follow-up for Phone Call Additional follow up Details #1::        Pt. needs to come in for CBC, orthostatic (lying and standing only)  BPs and pulses as nurse visit--can discuss with one of the providers about starting medroxyprogesterone and schedule for first available appt.   Otherwise, see if someone cancels for today or tomorrow and get her in with one of the providers Additional Follow-up by: Julieanne Manson MD,  January 17, 2010 12:35 PM    Additional Follow-up for Phone Call Additional follow up Details #2::    Pt. in office.  Dutch Quint RN  January 23, 2010 12:45 PM

## 2010-04-10 NOTE — Progress Notes (Signed)
Summary: MRI APPT   Phone Note Call from Patient   Summary of Call: th  after 2:00 not this week /She is @ work and can not be called she will call backgPt called before 9:00 this morning ,so I was not able to transfer call. she said she needs her MRI scheduled on the 17th after 2:00 can't go this week Pt will try to call back @2 :00 today to talk to [place]  or [place] Initial call taken by: Arta Bruce,  February 14, 2010 8:59 AM  Follow-up for Phone Call        For some reason the message got cut off???/Pt called this morning wanting to talk to [place] or Arna Medici ,but it was before 9:00 and I could not transfer the call.Pt needs MRI appt next maybe the 17th afer 2:00/She will call back today to talk to [place] Or Arna Medici @ 2:00 today Follow-up by: Arta Bruce,  February 14, 2010 9:03 AM  Additional Follow-up for Phone Call Additional follow up Details #1::        i TALK TO PT  AND I TOLD HER THAT I BEENTRYING TO REACH HER SINCE 12-5 AND LEFT MESSAGES AND I RESCHEDULE HER APPT 02-28-10 @ 3PM LVM TO PT WITH HER APPT . Additional Follow-up by: Cheryll Dessert,  February 14, 2010 2:48 PM

## 2010-04-10 NOTE — Letter (Signed)
Summary: *HSN Results Follow up  [facility]  [address] [place], [place] [address]   Phone: [phone]  Fax: [phone]      05/11/2009   Deborah Knight [address] [place], [place]  [address]   Dear  Deborah Knight,                            ____S.Drinkard,FNP   ____D. Gore,FNP       ____B. McPherson,MD   ____V. Rankins,MD    ____E. Mulberry,MD    ____N. Daphine Deutscher, FNP  ____D. Reche Dixon, MD    ____K. Philipp Deputy, MD    __x__S. Alben Spittle, PA-C     This letter is to inform you that your recent test(s):  _______Pap Smear    ___x____Lab Test     _______X-ray    ___x____ is within acceptable limits  _______ requires a medication change  _______ requires a follow-up lab visit  _______ requires a follow-up visit with your provider   Comments:       _________________________________________________________ If you have any questions, please contact our office                     Sincerely,  Tereso Newcomer PA-C [facility]

## 2010-04-10 NOTE — Assessment & Plan Note (Signed)
Summary: 4 WEEK FU ON ASTHMA/BP, BMET//KT   Vital Signs:  Patient profile:   45 year old female Height:      61 inches Weight:      196 pounds BMI:     37.17 O2 Sat:      100 % on Room air Temp:     97.8 degrees F Pulse rate:   78 / minute Pulse rhythm:   regular Resp:     18 per minute BP sitting:   140 / 98  (right arm) Cuff size:   large  Vitals Entered By: [place] Shannon (October 10, 2009 10:53 AM)  O2 Flow:  Room air  Serial Vital Signs/Assessments:  Time      Position  BP       Pulse  Resp  Temp     By                     110/80                         Tereso Newcomer PA-C  CC: follow-up visit asthma, still having couch at night Is Patient Diabetic? No Pain Assessment Patient in pain? no       Does patient need assistance? Functional Status Self care Ambulation Normal Comments peak flow results: 1. 280   2. 330   3.  340   Primary Care Provider:  Tereso Newcomer PA-C  CC:  follow-up visit asthma and still having couch at night.  History of Present Illness: Here for f/u on asthma. Symptoms are no better with restarting the flovent.  She feels like her symptoms are increasing.  She is having more symptoms at night and waking up at night with wheezing.  No fevers.  No sputum production.  Has had a cough and notes cough at night. Depression:  Seeing Marchelle Folks.  Helping.  Emotional today before coming in to see me.   GERD:  Notes occ. epigastric and chest pain.  Sometimes with meals.  Took some type of antacid in the past with relief.  She drinks cold water which makes better.  She notes water brash symptoms.  Asthma History    Asthma Control Assessment:    Age range: 12+ years    Symptoms: >2 days/week    Nighttime Awakenings: 4 or more/week    Interferes w/ normal activity: some limitations    SABA use (not for EIB): several times per day    Asthma Control Assessment: Very Poorly Controlled   Current Medications (verified): 1)  Benazepril-Hydrochlorothiazide 20-25  Mg Tabs (Benazepril-Hydrochlorothiazide) .... Take 1 Tablet By Mouth Once A Day 2)  Alprazolam 0.5 Mg  Tb24 (Alprazolam) .Marland Kitchen.. 1 To 2 Tabs By Mouth As Needed(Per [facility] or Dr.mckenzie) 3)  Zyrtec Hives Relief 10 Mg Tabs (Cetirizine Hcl) .... Take 1 Tablet By Mouth Once A Day For Allergies 4)  Nasacort Aq 55 Mcg/act  Aers (Triamcinolone Acetonide(Nasal)) .... Daily 5)  Proventil Hfa 108 (90 Base) Mcg/act Aers (Albuterol Sulfate) .Marland Kitchen.. 1-2 Puffs Every 4-6 Hours As Needed 6)  Flexeril 10 Mg Tabs (Cyclobenzaprine Hcl) .... Take 1 Tablet By Mouth Every 8 Hours As Needed Muscle Spasm 7)  Naprosyn 500 Mg Tabs (Naproxen) .Marland Kitchen.. 1 Two Times A Day As Needed Pain Take With Food 8)  Albuterol Sulfate (2.5 Mg/22ml) 0.083% Nebu (Albuterol Sulfate) .... Use One Ampule in Nebulizer Every 4-6 Hours As Needed For Cough/ Wheezing 9)  Flovent Hfa 110  Mcg/act Aero (Fluticasone Propionate  Hfa) .Marland Kitchen.. 1 Puff Two Times A Day 10)  Singulair 10 Mg Tabs (Montelukast Sodium) .... Take 1 Tablet By Mouth Once A Day For Allergies  Allergies (verified): No Known Drug Allergies  Family History: no colon, breast or ovarian cancer Family History Hypertension - mom no CAD  Physical Exam  General:  alert, well-developed, and well-nourished.   Head:  normocephalic and atraumatic.   Neck:  supple.   Lungs:  normal breath sounds, no crackles, and no wheezes.   Heart:  normal rate and regular rhythm.   Abdomen:  soft and non-tender.   Neurologic:  alert & oriented X3 and cranial nerves II-XII intact.   Psych:  normally interactive.     Impression & Recommendations:  Problem # 1:  DEPRESSION (ICD-311) f/u with counselor  Her updated medication list for this problem includes:    Alprazolam 0.5 Mg Tb24 (Alprazolam) .Marland Kitchen... 1 to 2 tabs by mouth as needed(per [facility] or dr.mckenzie)  Problem # 2:  ASTHMA (ICD-493.90) Assessment: Deteriorated moving air well on exam but symptoms are not improving . . . sound worse considered  prednisone, but lung exam is good will change to advair continue singulair and zyrtec add H2RA change ACE to ARB  Her updated medication list for this problem includes:    Proventil Hfa 108 (90 Base) Mcg/act Aers (Albuterol sulfate) .Marland Kitchen... 1-2 puffs every 4-6 hours as needed    Albuterol Sulfate (2.5 Mg/10ml) 0.083% Nebu (Albuterol sulfate) ..... Use one ampule in nebulizer every 4-6 hours as needed for cough/ wheezing    Advair Diskus 250-50 Mcg/dose Aepb (Fluticasone-salmeterol) .Marland Kitchen... 1 puff two times a day (pharmacy d/c flovent)    Singulair 10 Mg Tabs (Montelukast sodium) .Marland Kitchen... Take 1 tablet by mouth once a day for allergies  Problem # 3:  HYPERTENSION (ICD-401.9)  ? cough related to or exacerbated by ACE change to hyzaar  Her updated medication list for this problem includes:    Hyzaar 100-25 Mg Tabs (Losartan potassium-hctz) .Marland Kitchen... Take 1 tablet by mouth once a day for blood pressure (benazepril/hctz was d/c'd)  Orders: T-Basic Metabolic Panel ([phone])  Problem # 4:  GERD (ICD-530.81) add pepcid ? if acid contributing to symptoms   Her updated medication list for this problem includes:    Pepcid 20 Mg Tabs (Famotidine) .Marland Kitchen... Take 1 tablet by mouth two times a day  Problem # 5:  HYPERLIPIDEMIA (ICD-272.4)  Orders: T-Lipid Profile ([id number])  Complete Medication List: 1)  Hyzaar 100-25 Mg Tabs (Losartan potassium-hctz) .... Take 1 tablet by mouth once a day for blood pressure (benazepril/hctz was d/c'd) 2)  Alprazolam 0.5 Mg Tb24 (Alprazolam) .Marland Kitchen.. 1 to 2 tabs by mouth as needed(per [facility] or dr.mckenzie) 3)  Zyrtec Hives Relief 10 Mg Tabs (Cetirizine hcl) .... Take 1 tablet by mouth once a day for allergies 4)  Nasacort Aq 55 Mcg/act Aers (Triamcinolone acetonide(nasal)) .... Daily 5)  Proventil Hfa 108 (90 Base) Mcg/act Aers (Albuterol sulfate) .Marland Kitchen.. 1-2 puffs every 4-6 hours as needed 6)  Flexeril 10 Mg Tabs (Cyclobenzaprine hcl) .... Take 1 tablet by mouth every 8  hours as needed muscle spasm 7)  Naprosyn 500 Mg Tabs (Naproxen) .Marland Kitchen.. 1 two times a day as needed pain take with food 8)  Albuterol Sulfate (2.5 Mg/46ml) 0.083% Nebu (Albuterol sulfate) .... Use one ampule in nebulizer every 4-6 hours as needed for cough/ wheezing 9)  Advair Diskus 250-50 Mcg/dose Aepb (Fluticasone-salmeterol) .Marland Kitchen.. 1 puff two times a day (pharmacy  d/c flovent) 10)  Singulair 10 Mg Tabs (Montelukast sodium) .... Take 1 tablet by mouth once a day for allergies 11)  Pepcid 20 Mg Tabs (Famotidine) .... Take 1 tablet by mouth two times a day  Patient Instructions: 1)  Stop taking Flovent. 2)  Use Advair two times a day for your asthma. 3)  I gave you samples and sent a prescription to [facility].  Remember these are only good for one week. 4)  I want you to switch from Benazepril/HCTZ to Hyzaar.  I have sent the prescription to [facility].  When you get it, stop the benazepril and start on the Hyzaar for your blood pressure.   5)  Return to the office 2 weeks after changing blood pressure medicines for a blood pressure check and BMET (401.1). 6)  Start taking Pepcid for your stomach two times a day.  I sent this prescription to [facility].   7)  Schedule follow up in 4 weeks with Lorin Picket for asthma.  Return sooner if you feel worse or you are not getting better. Prescriptions: ADVAIR DISKUS 250-50 MCG/DOSE AEPB (FLUTICASONE-SALMETEROL) 1 puff two times a day (pharmacy d/c flovent)  #1 x 5   Entered and Authorized by:   Tereso Newcomer PA-C   Signed by:   Tereso Newcomer PA-C on 10/10/2009   Method used:   Faxed to ...       [facility] (retail)       [address] [place], [place]  [address]       Ph: [phone]       Fax: [phone]   RxID:   [id number] PEPCID 20 MG TABS (FAMOTIDINE) Take 1 tablet by mouth two times a day  #60 x 3   Entered and Authorized by:   Tereso Newcomer PA-C   Signed by:   Tereso Newcomer PA-C on 10/10/2009    Method used:   Faxed to ...       [facility] (retail)       [address] [place], [place]  [address]       Ph: [phone]       Fax: [phone]   RxID:   [id number] HYZAAR 100-25 MG TABS (LOSARTAN POTASSIUM-HCTZ) Take 1 tablet by mouth once a day for blood pressure (benazepril/hctz was d/c'd)  #30 x 5   Entered and Authorized by:   Tereso Newcomer PA-C   Signed by:   Tereso Newcomer PA-C on 10/10/2009   Method used:   Faxed to ...       [facility] (retail)       [address] [place], [place]  [address]       Ph: [phone]       Fax: [phone]   RxID:   [id number]

## 2010-04-10 NOTE — Letter (Signed)
Summary: PSYCHOLOGY SUMMARY  PSYCHOLOGY SUMMARY   Imported By: Arta Bruce 11/01/2009 12:28:34  _____________________________________________________________________  External Attachment:    Type:   Image     Comment:   External Document

## 2010-04-10 NOTE — Progress Notes (Signed)
°  Phone Note Call from Patient Call back at [facility] Phone [phone]   Caller: Patient Summary of Call: The patient wants to get a dental referral but i mentioned that we are not referral at this moment because there is a long waiting list.  The patient wants that I still send the message. Dr. Barbaraann Barthel Initial call taken by: Manon Hilding,  November 25, 2007 9:17 AM  Follow-up for Phone Call        patient was seen at dental clinic a couple of times around 2008 and 2009 and I explianed to her that I will be sending over another ref. Follow-up by: Leodis Rains,  November 25, 2007 11:41 AM

## 2010-04-10 NOTE — Progress Notes (Signed)
  Phone Note Call from Patient Call back at [facility] Phone [phone] Call back at [phone]   Summary of Call: The pt wants to be seen today if that is possible because her bp is very high . The bottom level was around 107 and she has bad headache.  The pt had been taking ibuprofen and tyleno but the medicationsl are not working for her.  Pt had been taking her bp pills too. [facility]    Initial call taken by: Manon Hilding,  March 17, 2009 8:07 AM  Follow-up for Phone Call        left message to return call...........Marland KitchenMikey College CMA  March 17, 2009 5:01 PM    Left message on answering machine for pt to call back.Marland KitchenMarland KitchenMarland KitchenArmenia Shannon  March 21, 2009 12:16 PM   Left message on answering machine for pt to call back.Marland Kitchen...will mail letter......Marland KitchenArmenia Shannon  March 22, 2009 10:03 AM

## 2010-04-10 NOTE — Letter (Signed)
Summary: *HSN Results Follow up  [facility]  [address] [place], [place] [address]   Phone: [phone]  Fax: [phone]      09/19/2009   LARUA COLLIER [address] [place], [place]  [address]   Dear  Ms. Tameisha [name],                            ____S.Drinkard,FNP   ____D. Gore,FNP       ____B. McPherson,MD   ____V. Rankins,MD    ____E. Mulberry,MD    ____N. Daphine Deutscher, FNP  ____D. Reche Dixon, MD    ____K. Philipp Deputy, MD    __x__S. Alben Spittle, PA-C     This letter is to inform you that your recent test(s):  ___x____Pap Smear  is normal.  ___x____Lab Test   requires a follow up lab visit.  Please contact the office.   _______X-ray      Comments:       _________________________________________________________ If you have any questions, please contact our office                     Sincerely,  Tereso Newcomer PA-C [facility]

## 2010-04-10 NOTE — Assessment & Plan Note (Signed)
Summary: CPP EXAM/////GK   Vital Signs:  Patient profile:   45 year old female Height:      61 inches Weight:      200 pounds Temp:     98.3 degrees F oral Pulse rate:   83 / minute Pulse rhythm:   regular Resp:     18 per minute BP sitting:   143 / 92  (left arm) Cuff size:   regular  Vitals Entered By: Armenia Shannon (September 13, 2009 2:59 PM) CC: Hypertension Management Comments PF   1.    290     2. 320        3. 220   Primary Care Provider:  Tereso Newcomer PA-C  CC:  Hypertension Management.  History of Present Illness: Here for CPP.  Health Maint:  LMP 09/07/2009 No h/o abnormal pap. Had 2 periods last month.  Notes heavy menses with clotting and cramping.  No trouble with pregnancy in past.  Notes dyspareunia at times. Does miss some months.   She is sexually active.  One partner.  No STD concerns.  No vaginal discharge or burning or itching. Due for mammo. PHQ9=10.  Tearful during interview.  Lost job in March after 17 years.  Did answer 1 to question 9.  With further questioning, she denies having a plan for suicide.  No further thoughts of violence toward self.  No homicidal thoughts.    Asthma History    Asthma Control Assessment:    Age range: 12+ years    Symptoms: >2 days/week    Nighttime Awakenings: 1-3/week    Interferes w/ normal activity: some limitations    SABA use (not for EIB): >2 days/week    Asthma Control Assessment: Not Well Controlled  Hypertension History:      She denies headache and chest pain.        Positive major cardiovascular risk factors include hyperlipidemia, hypertension, and family history for ischemic heart disease (males less than 67 years old).  Negative major cardiovascular risk factors include female age less than 47 years old and non-tobacco-user status.      Habits & Providers  Alcohol-Tobacco-Diet     Tobacco Status: quit  Exercise-Depression-Behavior     Drug Use: no  Problems Prior to Update: 1)  Menorrhagia   (ICD-626.2) 2)  Depression  (ICD-311) 3)  Allergic Rhinitis  (ICD-477.9) 4)  Uri  (ICD-465.9) 5)  Obesity  (ICD-278.00) 6)  Low Back Pain Syndrome  (ICD-724.2) 7)  Screening For Malignant Neoplasm, Cervix  (ICD-V76.2) 8)  Examination, Routine Medical  (ICD-V70.0) 9)  Trichomonal Vaginitis  (ICD-131.01) 10)  Unspecified Breast Screening  (ICD-V76.10) 11)  Acute Bronchitis  (ICD-466.0) 12)  Trochanteric Bursitis, Right  (ICD-726.5) 13)  Uti  (ICD-599.0) 14)  Costochondritis  (ICD-733.6) 15)  Routine Gynecological Examination  (ICD-V72.31) 16)  Bacterial Vaginitis  (ICD-616.10) 17)  Cough  (ICD-786.2) 18)  Infection, Urinary Tract Nos  (ICD-599.0) 19)  Hyperlipidemia  (ICD-272.4) 20)  Hypertension  (ICD-401.9) 21)  Asthma  (ICD-493.90) 22)  Anxiety  (ICD-300.00)  Allergies: No Known Drug Allergies  Past History:  Past Medical History: Last updated: 12/29/2007 insomnia Anxiety Asthma...normal PFTs in 2/08. Hypertension Hyperlipidemia  Past Surgical History: Tubal ligation 1997 PFTs 2/08 normal s/p hernia repair 1980s  Family History: no colon, breast or ovarian cancer Family History Hypertension - mom  Social History: Former Smoker Alcohol use-yes  a. 1-2 glasses of wine 1-2 times a week Drug use-no 4 kids Divorced  Review  of Systems      See HPI CV:  Denies chest pain or discomfort and fainting. Resp:  Complains of cough and wheezing. GI:  Denies bloody stools and dark tarry stools. GU:  Denies hematuria. MS:  Complains of low back pain. Derm:  Denies rash. Neuro:  Denies headaches. Psych:  Complains of depression; denies suicidal thoughts/plans and thoughts of violence.  Physical Exam  General:  alert, well-developed, and well-nourished.   Head:  normocephalic and atraumatic.   Eyes:  pupils equal, pupils round, pupils reactive to light, and no optic disk abnormalities.   Ears:  R ear normal and L ear normal.   Nose:  no external deformity.     Mouth:  pharynx pink and moist, no erythema, and no exudates.   Neck:  supple, no thyromegaly, and no cervical lymphadenopathy.   Breasts:  skin/areolae normal, no masses, no abnormal thickening, no nipple discharge, no tenderness, and no adenopathy.   Lungs:  normal breath sounds, no crackles, and no wheezes.   Heart:  normal rate, regular rhythm, and no murmur.   Abdomen:  soft, non-tender, and no hepatomegaly.   Rectal:  no external abnormalities.   Genitalia:  normal introitus, no external lesions, no vaginal discharge, mucosa pink and moist, no vaginal or cervical lesions, no vaginal atrophy, and no friaility or hemorrhage.   body habitus difficult to palpate fundus and adnexae  Msk:  normal ROM.   Pulses:  R posterior tibial normal, R dorsalis pedis normal, L posterior tibial normal, and L dorsalis pedis normal.   Extremities:  no edema  Neurologic:  alert & oriented X3 and cranial nerves II-XII intact.   Skin:  turgor normal.   Psych:  normally interactive and tearful.     Impression & Recommendations:  Problem # 1:  DEPRESSION (ICD-311) refer to counselor may go back to [facility] if needed not really interested in going on SSRI going to her psych is too expensive denies any current plans for suicide or ideations  Her updated medication list for this problem includes:    Alprazolam 0.5 Mg Tb24 (Alprazolam) .Marland Kitchen... 1 to 2 tabs by mouth as needed(per [facility] or dr.mckenzie)  Orders: Psychology Referral (Psychology)  Problem # 2:  HYPERTENSION (ICD-401.9) up today may be from stress check BP in 3 weeks add amlodipine if still up   Her updated medication list for this problem includes:    Benazepril-hydrochlorothiazide 20-25 Mg Tabs (Benazepril-hydrochlorothiazide) .Marland Kitchen... Take 1 tablet by mouth once a day  Orders: UA Dipstick w/o Micro (manual) (40347) T-Comprehensive Metabolic Panel (42595-63875)  Problem # 3:  HYPERLIPIDEMIA (ICD-272.4) schedule  FLP  Orders: T-Comprehensive Metabolic Panel ([id number])  Problem # 4:  MENORRHAGIA (ICD-626.2) check FSH if up, may be perimenopausal if not and continues to have, consider vaginal u/s +/- referral to GYN for options not a good candidate for OCPs with HTN and age review at f/u OV  Orders: T-CBC w/Diff (41660-63016) T-TSH [phone]) T-FSH (32202-54270)  Problem # 5:  ASTHMA (ICD-493.90) Assessment: Deteriorated get back on flovent  get back on antihistamine f/u in 4-6 weeks  Her updated medication list for this problem includes:    Proventil Hfa 108 (90 Base) Mcg/act Aers (Albuterol sulfate) .Marland Kitchen... 1-2 puffs every 4-6 hours as needed    Albuterol Sulfate (2.5 Mg/12ml) 0.083% Nebu (Albuterol sulfate) ..... Use one ampule in nebulizer every 4-6 hours as needed for cough/ wheezing    Flovent Hfa 110 Mcg/act Aero (Fluticasone propionate  hfa) .Marland Kitchen... 1 puff two  times a day    Singulair 10 Mg Tabs (Montelukast sodium) .Marland Kitchen... Take 1 tablet by mouth once a day for allergies  Problem # 6:  EXAMINATION, ROUTINE MEDICAL (ICD-V70.0)  Orders: KOH/ WET Mount [phone]) T-HIV Antibody  (Reflex) [id number]) T-TSH ([phone]) T-Pap Smear, Thin Prep (57846) T- GC Chlamydia (96295) Mammogram (Screening) (Mammo)  Problem # 7:  TRICHOMONAL VAGINITIS (ICD-131.01)  Complete Medication List: 1)  Benazepril-hydrochlorothiazide 20-25 Mg Tabs (Benazepril-hydrochlorothiazide) .... Take 1 tablet by mouth once a day 2)  Alprazolam 0.5 Mg Tb24 (Alprazolam) .Marland Kitchen.. 1 to 2 tabs by mouth as needed(per [facility] or dr.mckenzie) 3)  Zyrtec Hives Relief 10 Mg Tabs (Cetirizine hcl) .... Take 1 tablet by mouth once a day for allergies 4)  Nasacort Aq 55 Mcg/act Aers (Triamcinolone acetonide(nasal)) .... Daily 5)  Proventil Hfa 108 (90 Base) Mcg/act Aers (Albuterol sulfate) .Marland Kitchen.. 1-2 puffs every 4-6 hours as needed 6)  Flexeril 10 Mg Tabs (Cyclobenzaprine hcl) .... Take 1 tablet by mouth every 8 hours as needed  muscle spasm 7)  Naprosyn 500 Mg Tabs (Naproxen) .Marland Kitchen.. 1 two times a day as needed pain take with food 8)  Albuterol Sulfate (2.5 Mg/80ml) 0.083% Nebu (Albuterol sulfate) .... Use one ampule in nebulizer every 4-6 hours as needed for cough/ wheezing 9)  Flovent Hfa 110 Mcg/act Aero (Fluticasone propionate  hfa) .Marland Kitchen.. 1 puff two times a day 10)  Singulair 10 Mg Tabs (Montelukast sodium) .... Take 1 tablet by mouth once a day for allergies 11)  Flagyl 500 Mg Tabs (Metronidazole) .... Take 1 tablet by mouth two times a day  Hypertension Assessment/Plan:      The patient's hypertensive risk group is category B: At least one risk factor (excluding diabetes) with no target organ damage.  Today's blood pressure is 143/92.  Her blood pressure goal is < 140/90.  Patient Instructions: 1)  Lab visit with RN for BP check in 3 weeks. 2)  Schedule appointment with Ethelene Browns. 3)  Schedule fasting lipids. 4)  Please schedule a follow-up appointment in 4-6 weeks with Maydell Knoebel for asthma, blood pressure.  5)  Take Flagyl (Metronidazole) until all gone. 6)  Refrain from sex for 7 days after treatment.  Your partner must be treated as well to prevent reinfection. 7)  Get back on Flovent. 8)  Start Zyrtec in addition to your other medicines. Prescriptions: FLAGYL 500 MG TABS (METRONIDAZOLE) Take 1 tablet by mouth two times a day  #14 x 0   Entered and Authorized by:   Tereso Newcomer PA-C   Signed by:   Tereso Newcomer PA-C on 09/13/2009   Method used:   Print then Give to Patient   RxID:   [id number] FLOVENT HFA 110 MCG/ACT AERO (FLUTICASONE PROPIONATE  HFA) 1 puff two times a day  #1 x 11   Entered and Authorized by:   Tereso Newcomer PA-C   Signed by:   Tereso Newcomer PA-C on 09/13/2009   Method used:   Print then Give to Patient   RxID:   [id number] ZYRTEC HIVES RELIEF 10 MG TABS (CETIRIZINE HCL) Take 1 tablet by mouth once a day for allergies  #30 x 5   Entered and Authorized by:   Tereso Newcomer PA-C    Signed by:   Tereso Newcomer PA-C on 09/13/2009   Method used:   Print then Give to Patient   RxID:   [id number]   Laboratory Results   Urine Tests  Date/Time Received: September 13, 2009 3:41 PM  Routine Urinalysis   Color: lt. yellow Appearance: Clear Glucose: negative   (Normal Range: Negative) Bilirubin: negative   (Normal Range: Negative) Ketone: negative   (Normal Range: Negative) Spec. Gravity: 1.015   (Normal Range: 1.003-1.035) Blood: negative   (Normal Range: Negative) pH: 7.5   (Normal Range: 5.0-8.0) Protein: negative   (Normal Range: Negative) Urobilinogen: 0.2   (Normal Range: 0-1) Nitrite: negative   (Normal Range: Negative) Leukocyte Esterace: negative   (Normal Range: Negative)      Wet Mount Source: vaginal WBC/hpf: 10-20 Bacteria/hpf: 1+ Clue cells/hpf: moderate  Positive whiff Yeast/hpf: none Wet Mount KOH: Negative Trichomonas/hpf: moderate

## 2010-04-10 NOTE — Assessment & Plan Note (Signed)
**Note De-Identified Deborah Knight Obfuscation** Summary: abscess? on one leg//mc   Vital Signs:  Patient profile:   45 year old female Menstrual status:  irregular LMP:     01/02/2010 Weight:      199.50 pounds O2 Sat:      100 % on Room air Temp:     97.1 degrees F oral Pulse rate:   76 / minute Resp:     24 per minute BP sitting:   134 / 96  (left arm) Cuff size:   regular  Vitals Entered By: Hale Drone CMA (January 23, 2010 4:24 PM)  O2 Flow:  Room air CC: Here for an abscess on right leg. First noticed on 01/19/10 and it is tender. It is uncomfortable especially when working and walking for long periods of time. Needs refill on Naproxen, Zyrtec and Pepcid Is Patient Diabetic? No Pain Assessment Patient in pain? no       Does patient need assistance? Functional Status Self care Ambulation Normal LMP (date): 01/02/2010 LMP - Character: normal Menarche (age onset years): 13    Menstrual flow (days): 4-5 Menstrual Status irregular Enter LMP: 01/02/2010 Last PAP Result NEGATIVE FOR INTRAEPITHELIAL LESIONS OR MALIGNANCY.   Serial Vital Signs/Assessments:                                PEF    PreRx  PostRx Time      O2 Sat  O2 Type     L/min  L/min  L/min   By           100 %   Room air                          Hale Drone CMA  Comments: Peak Flow: 350 - 370 - 410 By: Hale Drone CMA    Primary Care Provider:  Tereso Newcomer PA-C  CC:  Here for an abscess on right leg. First noticed on 01/19/10 and it is tender. It is uncomfortable especially when working and walking for long periods of time. Needs refill on Naproxen and Zyrtec and Pepcid.  History of Present Illness: 1.  Pain in right leg on regular basis.  Was massaging Icy Hot behind knee on 11/11 and felt a knot.  Could not see anything on outside.  May have enlarged and may have increased tenderness in area since Friday.  No fever.  Allergies (verified): No Known Drug Allergies  Physical Exam  Skin:  2 X 1 cm cystic lesion that moves fairly freely in  subcutaneous fat of medial knee, though feels a bit tethered to underlying tissue.  No fluctuance, no overlying erythema.  Mildly tender with deep palpation.  Less defined similar lesion on the left in same area.   Impression & Recommendations:  Problem # 1:  PROBABLE SEBACEOUS CYST (ICD-706.2) My only concern is the possible tethering of lesion to underlying tissue--visualize with ultrasound Orders: Ultrasound (Ultrasound)  Complete Medication List: 1)  Hyzaar 100-25 Mg Tabs (Losartan potassium-hctz) .... Take 1 tablet by mouth once a day for blood pressure (benazepril/hctz was d/c'd) 2)  Alprazolam 0.5 Mg Tb24 (Alprazolam) .Marland Kitchen.. 1 to 2 tabs by mouth as needed(per [facility] or dr.mckenzie) 3)  Zyrtec Hives Relief 10 Mg Tabs (Cetirizine hcl) .... Take 1 tablet by mouth once a day for allergies 4)  Nasacort Aq 55 Mcg/act Aers (Triamcinolone acetonide(nasal)) .... Daily 5)  Proventil Hfa 108 (90 Base) **Note De-Identified Dejay Kronk Obfuscation** Mcg/act Aers (Albuterol sulfate) .Marland Kitchen.. 1-2 puffs every 4-6 hours as needed 6)  Flexeril 10 Mg Tabs (Cyclobenzaprine hcl) .... Take 1 tablet by mouth every 8 hours as needed muscle spasm 7)  Naprosyn 500 Mg Tabs (Naproxen) .Marland Kitchen.. 1 two times a day as needed pain take with food 8)  Albuterol Sulfate (2.5 Mg/86ml) 0.083% Nebu (Albuterol sulfate) .... Use one ampule in nebulizer every 4-6 hours as needed for cough/ wheezing 9)  Advair Diskus 250-50 Mcg/dose Aepb (Fluticasone-salmeterol) .Marland Kitchen.. 1 puff two times a day (pharmacy d/c flovent) 10)  Singulair 10 Mg Tabs (Montelukast sodium) .... Take 1 tablet by mouth once a day for allergies 11)  Pepcid 20 Mg Tabs (Famotidine) .... Take 1 tablet by mouth two times a day 12)  Pravastatin Sodium 40 Mg Tabs (Pravastatin sodium) .... Take 1 tab by mouth at bedtime  Other Orders: Flu Vaccine 89yrs + ([address]) Admin 1st Vaccine (60454)  Patient Instructions: 1)  Leave the cyst area alone Prescriptions: PEPCID 20 MG TABS (FAMOTIDINE) Take 1 tablet by mouth two times a  day  #60 x 3   Entered and Authorized by:   Julieanne Manson MD   Signed by:   Julieanne Manson MD on 01/23/2010   Method used:   Faxed to ...       [facility] (retail)       [address] [place], [place]  [address]       Ph: [phone]       Fax: [phone]   RxID:   [id number] NAPROSYN 500 MG TABS (NAPROXEN) 1 two times a day as needed pain Take with food  #60 x 2   Entered and Authorized by:   Julieanne Manson MD   Signed by:   Julieanne Manson MD on 01/23/2010   Method used:   Faxed to ...       [facility] (retail)       [address] [place], [place]  [address]       Ph: [phone]       Fax: [phone]   RxID:   [id number] ZYRTEC HIVES RELIEF 10 MG TABS (CETIRIZINE HCL) Take 1 tablet by mouth once a day for allergies  #30 x 5   Entered and Authorized by:   Julieanne Manson MD   Signed by:   Julieanne Manson MD on 01/23/2010   Method used:   Faxed to ...       [facility] (retail)       [address] [place], [place]  [address]       Ph: [phone]       Fax: [phone]   RxID:   [id number]    Orders Added: 1)  Flu Vaccine 77yrs + [90658] 2)  Admin 1st Vaccine [90471] 3)  Ultrasound [Ultrasound] 4)  Est. Patient Level II [54627]   Immunizations Administered:  Influenza Vaccine # 1:    Vaccine Type: Fluvax 3+    Site: left deltoid    Mfr: GlaxoSmithKline    Dose: 0.5 ml    Route: IM    Given by: Hale Drone CMA    Exp. Date: 09/08/2010    Lot #: [id number]    VIS given: 10/03/09 version given January 23, 2010.  Flu Vaccine Consent Questions:    Do you have a history **Note De-Identified Nilson Tabora Obfuscation** of severe allergic reactions to this vaccine? no    Any prior history of allergic reactions to egg and/or gelatin? no    Do you have a sensitivity to the preservative Thimersol? no    Do you have a past history of  Guillan-Barre Syndrome? no    Do you currently have an acute febrile illness? no    Have you ever had a severe reaction to latex? yes    Vaccine information given and explained to patient? yes    Are you currently pregnant? no   Immunizations Administered:  Influenza Vaccine # 1:    Vaccine Type: Fluvax 3+    Site: left deltoid    Mfr: GlaxoSmithKline    Dose: 0.5 ml    Route: IM    Given by: Hale Drone CMA    Exp. Date: 09/08/2010    Lot #: [id number]    VIS given: 10/03/09 version given January 23, 2010.

## 2010-04-18 NOTE — Letter (Signed)
Summary: *HSN Results Follow up  [facility]  [address] [place], [place] [address]   Phone: [phone]  Fax: [phone]      04/10/2010   KAMREN HESKETT [address] [place], [place]  [address]   Dear  Ms. Aleisha [name],                            ____S.Drinkard,FNP   ____D. Gore,FNP       ____B. McPherson,MD   ____V. Rankins,MD    _X___E. Karoline Fleer,MD    ____N. Daphine Deutscher, FNP  ____D. Reche Dixon, MD    ____K. Philipp Deputy, MD    ____Other     This letter is to inform you that your recent test(s):  _______Pap Smear    _______Lab Test     ___X____X-ray    _______ is within acceptable limits  _______ requires a medication change  _______ requires a follow-up lab visit  ___X___ requires a follow-up visit with your provider   Comments:  I need to discuss your MRI with you.  Please call and make an appt.  If you are unable to come to the office any time soon, please call and let me know the best time to get hold of you.       _________________________________________________________ If you have any questions, please contact our office                     Sincerely,  Julieanne Manson MD [facility]

## 2010-04-27 ENCOUNTER — Telehealth (INDEPENDENT_AMBULATORY_CARE_PROVIDER_SITE_OTHER): Payer: Self-pay | Admitting: Internal Medicine

## 2010-05-17 NOTE — Progress Notes (Signed)
Summary: Change BP med until she gets eligibility renewed  Phone Note Call from Patient Call back at Home Phone [phone]   Reason for Call: Refill Medication Summary of Call: Deborah Knight called because her card is expired and she needs refills on her bp medicationbut cannot afford the Losartin/HCTZ at [facility], so she wants to go back to Benazepril and she can afford it [facility], she can get a 3 month supply for about $30, just until she gets her eligibility straight. Initial call taken by: Leodis Rains,  April 27, 2010 3:46 PM  Follow-up for Phone Call        Sent to Dr. Delrae Alfred.  Deborah Quint RN  April 27, 2010 4:08 PM   Additional Follow-up for Phone Call Additional follow up Details #1::        Her meds was switched to losartan from Benazepril because of her cough back 10/2009.   Would like to see if we can get her some samples of an ARB--can you check to see if we have an ARBs in the closet? If not sure, I can check tomorrow Additional Follow-up by: Julieanne Manson MD,  April 30, 2010 5:32 PM  New Problems: COUGH (ICD-786.2)   Additional Follow-up for Phone Call Additional follow up Details #2::    I checked med room -- no ARBs in stock.  Deborah Quint RN  May 01, 2010 12:33 PM  Check with pt. and see if she already has an appt. for eligibility--if so, see if pharmacy will fill her Losartan combination for her--if not, see how much Amlodipine 10 mg daily for 1 month is at [facility].  thanks  Julieanne Manson MD  May 02, 2010 12:25 PM   Checked in [place] -- pt. does not have appt. scheduled for eligibility,  left message on voicemail for pt. to return call.  Checked with [facility] -- card expired in 2009 -- unable to get pharmacy refills.  Called [facility] - amlodipine 10 mg. x30 days would be $21.72.  Deborah Quint RN  May 04, 2010 12:29 PM  How has she been seen in past 2 years if her eligibilty expired?  Do they have the right person?    Amlodipine written--she needs to get her bp checked in 2 weeks after starting --to call into [place].  Julieanne Manson MD  May 04, 2010 3:27 PM   Pt. states eligibility ended in 02/2010 not 2009.  Advised of new Rx at [facility] and need for f/u BP recheck in two weeks.  Will set-up appt. for eligibility and then BP recheck.  Deborah Quint RN  May 07, 2010 3:24 PM   New Problems: COUGH [id number]) New/Updated Medications: AMLODIPINE BESYLATE 10 MG TABS (AMLODIPINE BESYLATE) 1 tab by mouth daly Prescriptions: AMLODIPINE BESYLATE 10 MG TABS (AMLODIPINE BESYLATE) 1 tab by mouth daly  #30 x 2   Entered and Authorized by:   Julieanne Manson MD   Signed by:   Deborah Quint RN on 05/07/2010   Method used:   Electronically to        [facility] (retail)       [address]       [place], [place]  [address]       Ph: [phone]       Fax: [phone]   RxID:   [id number]

## 2010-05-21 ENCOUNTER — Encounter: Payer: Self-pay | Admitting: Internal Medicine

## 2010-05-21 ENCOUNTER — Encounter (INDEPENDENT_AMBULATORY_CARE_PROVIDER_SITE_OTHER): Payer: Self-pay | Admitting: Internal Medicine

## 2010-05-21 LAB — CREATININE, SERUM: Creatinine, Ser: 0.57 mg/dL (ref 0.4–1.2)
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
## 2010-06-14 ENCOUNTER — Emergency Department (HOSPITAL_COMMUNITY)
Admission: EM | Admit: 2010-06-14 | Discharge: 2010-06-14 | Disposition: A | Discharge status: Home / Self Care | Payer: Self-pay | Attending: Emergency Medicine | Admitting: Emergency Medicine

## 2010-06-14 ENCOUNTER — Emergency Department (HOSPITAL_COMMUNITY): Payer: Self-pay

## 2010-06-14 DIAGNOSIS — R071 Chest pain on breathing: Secondary | ICD-10-CM | POA: Insufficient documentation

## 2010-06-14 DIAGNOSIS — I10 Essential (primary) hypertension: Secondary | ICD-10-CM | POA: Insufficient documentation

## 2010-06-14 DIAGNOSIS — R079 Chest pain, unspecified: Secondary | ICD-10-CM | POA: Insufficient documentation

## 2010-06-14 DIAGNOSIS — F411 Generalized anxiety disorder: Secondary | ICD-10-CM | POA: Insufficient documentation

## 2010-07-12 ENCOUNTER — Ambulatory Visit: Payer: Self-pay | Admitting: Physical Therapy

## 2010-07-24 ENCOUNTER — Ambulatory Visit: Payer: Self-pay | Attending: Internal Medicine

## 2010-07-24 DIAGNOSIS — M546 Pain in thoracic spine: Secondary | ICD-10-CM | POA: Insufficient documentation

## 2010-07-24 DIAGNOSIS — M25569 Pain in unspecified knee: Secondary | ICD-10-CM | POA: Insufficient documentation

## 2010-07-24 DIAGNOSIS — R262 Difficulty in walking, not elsewhere classified: Secondary | ICD-10-CM | POA: Insufficient documentation

## 2010-07-24 DIAGNOSIS — R0789 Other chest pain: Secondary | ICD-10-CM | POA: Insufficient documentation

## 2010-07-24 DIAGNOSIS — IMO0001 Reserved for inherently not codable concepts without codable children: Secondary | ICD-10-CM | POA: Insufficient documentation

## 2010-07-26 ENCOUNTER — Ambulatory Visit: Payer: Self-pay | Admitting: Physical Therapy

## 2010-08-01 ENCOUNTER — Ambulatory Visit: Payer: Self-pay | Admitting: Physical Therapy

## 2010-08-14 ENCOUNTER — Ambulatory Visit: Payer: Self-pay | Attending: Internal Medicine

## 2010-08-14 DIAGNOSIS — M25569 Pain in unspecified knee: Secondary | ICD-10-CM | POA: Insufficient documentation

## 2010-08-14 DIAGNOSIS — R0789 Other chest pain: Secondary | ICD-10-CM | POA: Insufficient documentation

## 2010-08-14 DIAGNOSIS — R262 Difficulty in walking, not elsewhere classified: Secondary | ICD-10-CM | POA: Insufficient documentation

## 2010-08-14 DIAGNOSIS — IMO0001 Reserved for inherently not codable concepts without codable children: Secondary | ICD-10-CM | POA: Insufficient documentation

## 2010-08-14 DIAGNOSIS — M546 Pain in thoracic spine: Secondary | ICD-10-CM | POA: Insufficient documentation

## 2010-08-15 ENCOUNTER — Encounter: Payer: Self-pay | Admitting: Physical Therapy

## 2010-08-20 ENCOUNTER — Encounter: Payer: Self-pay | Admitting: Physical Therapy

## 2010-08-23 ENCOUNTER — Ambulatory Visit: Payer: Self-pay

## 2010-09-17 ENCOUNTER — Encounter: Payer: Self-pay | Admitting: Physical Therapy

## 2010-09-27 ENCOUNTER — Ambulatory Visit: Payer: Self-pay | Attending: Internal Medicine | Admitting: Physical Therapy

## 2010-09-27 DIAGNOSIS — R262 Difficulty in walking, not elsewhere classified: Secondary | ICD-10-CM | POA: Insufficient documentation

## 2010-09-27 DIAGNOSIS — M546 Pain in thoracic spine: Secondary | ICD-10-CM | POA: Insufficient documentation

## 2010-09-27 DIAGNOSIS — R0789 Other chest pain: Secondary | ICD-10-CM | POA: Insufficient documentation

## 2010-09-27 DIAGNOSIS — IMO0001 Reserved for inherently not codable concepts without codable children: Secondary | ICD-10-CM | POA: Insufficient documentation

## 2010-09-27 DIAGNOSIS — M25569 Pain in unspecified knee: Secondary | ICD-10-CM | POA: Insufficient documentation

## 2010-10-09 ENCOUNTER — Ambulatory Visit: Payer: Self-pay

## 2010-10-11 ENCOUNTER — Ambulatory Visit: Payer: Self-pay

## 2010-10-16 ENCOUNTER — Encounter: Payer: Self-pay | Admitting: Physical Therapy

## 2010-10-18 ENCOUNTER — Encounter: Payer: Self-pay | Admitting: Physical Therapy

## 2010-12-09 ENCOUNTER — Emergency Department (HOSPITAL_COMMUNITY)
Admission: EM | Admit: 2010-12-09 | Discharge: 2010-12-09 | Disposition: A | Discharge status: Home / Self Care | Payer: No Typology Code available for payment source | Attending: Emergency Medicine | Admitting: Emergency Medicine

## 2010-12-09 ENCOUNTER — Emergency Department (HOSPITAL_COMMUNITY): Payer: Self-pay

## 2010-12-09 DIAGNOSIS — F411 Generalized anxiety disorder: Secondary | ICD-10-CM | POA: Insufficient documentation

## 2010-12-09 DIAGNOSIS — R000 Tachycardia, unspecified: Secondary | ICD-10-CM | POA: Insufficient documentation

## 2010-12-09 DIAGNOSIS — M549 Dorsalgia, unspecified: Secondary | ICD-10-CM | POA: Insufficient documentation

## 2010-12-09 DIAGNOSIS — T1490XA Injury, unspecified, initial encounter: Secondary | ICD-10-CM | POA: Insufficient documentation

## 2010-12-09 DIAGNOSIS — I10 Essential (primary) hypertension: Secondary | ICD-10-CM | POA: Insufficient documentation

## 2010-12-09 DIAGNOSIS — Y92410 Unspecified street and highway as the place of occurrence of the external cause: Secondary | ICD-10-CM | POA: Insufficient documentation

## 2010-12-12 ENCOUNTER — Emergency Department (HOSPITAL_BASED_OUTPATIENT_CLINIC_OR_DEPARTMENT_OTHER)
Admission: EM | Admit: 2010-12-12 | Discharge: 2010-12-12 | Disposition: A | Discharge status: Home / Self Care | Payer: No Typology Code available for payment source | Attending: Emergency Medicine | Admitting: Emergency Medicine

## 2010-12-12 DIAGNOSIS — M545 Low back pain, unspecified: Secondary | ICD-10-CM | POA: Insufficient documentation

## 2010-12-12 DIAGNOSIS — Y92410 Unspecified street and highway as the place of occurrence of the external cause: Secondary | ICD-10-CM | POA: Insufficient documentation

## 2010-12-12 DIAGNOSIS — F17200 Nicotine dependence, unspecified, uncomplicated: Secondary | ICD-10-CM | POA: Insufficient documentation

## 2010-12-12 DIAGNOSIS — I10 Essential (primary) hypertension: Secondary | ICD-10-CM | POA: Insufficient documentation

## 2010-12-12 HISTORY — DX: Anxiety disorder, unspecified: F41.9

## 2010-12-12 HISTORY — DX: Essential (primary) hypertension: I10

## 2010-12-12 MED ORDER — CYCLOBENZAPRINE HCL 10 MG PO TABS: 10.0000 mg | ORAL_TABLET | Freq: Two times a day (BID) | ORAL | Status: AC | PRN

## 2010-12-12 MED ORDER — IBUPROFEN 800 MG PO TABS: 800.0000 mg | ORAL_TABLET | Freq: Three times a day (TID) | ORAL | Status: AC

## 2010-12-12 NOTE — ED Provider Notes (Signed)
Medical screening examination/treatment/procedure(s) were performed by non-physician practitioner and as supervising physician I was immediately available for consultation/collaboration.  Jeanna Giuffre A. Candia Kingsbury, MD 12/12/10 1313 
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
## 2010-12-12 NOTE — ED Notes (Signed)
Patient states she is here for a follow up visit.
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
## 2010-12-24 LAB — URINALYSIS, ROUTINE W REFLEX MICROSCOPIC
Glucose, UA: NEGATIVE
Specific Gravity, Urine: 1.016

## 2010-12-24 LAB — URINE MICROSCOPIC-ADD ON

## 2010-12-24 LAB — URINE CULTURE

## 2011-04-18 ENCOUNTER — Encounter (HOSPITAL_COMMUNITY): Payer: Self-pay

## 2011-04-18 ENCOUNTER — Emergency Department (INDEPENDENT_AMBULATORY_CARE_PROVIDER_SITE_OTHER)
Admission: EM | Admit: 2011-04-18 | Discharge: 2011-04-18 | Disposition: A | Discharge status: Home / Self Care | Payer: Self-pay | Source: Home / Self Care | Attending: Family Medicine | Admitting: Family Medicine

## 2011-04-18 DIAGNOSIS — J069 Acute upper respiratory infection, unspecified: Secondary | ICD-10-CM

## 2011-04-18 DIAGNOSIS — J40 Bronchitis, not specified as acute or chronic: Secondary | ICD-10-CM

## 2011-04-18 MED ORDER — GUAIFENESIN-CODEINE 100-10 MG/5ML PO SYRP: ORAL_SOLUTION | ORAL | Status: AC

## 2011-04-18 MED ORDER — AZITHROMYCIN 250 MG PO TABS: 250.0000 mg | ORAL_TABLET | Freq: Every day | ORAL | Status: AC

## 2011-04-18 NOTE — ED Notes (Signed)
C/o sore throat, sneezing, cough, nasal congestion and lt upper back pain from cough for 4-5 days.  Denies fever.

## 2011-04-18 NOTE — ED Provider Notes (Signed)
History     CSN: [id number]  Arrival date & time 04/18/11  1478   First MD Initiated Contact with Patient 04/18/11 [phone]      Chief Complaint  Patient presents with  . URI    (Consider location/radiation/quality/duration/timing/severity/associated sxs/prior treatment) HPI Comments: Onset 3-4 days ago with sore throat , runny nose, facial pressure and cough . Cough causing chest to hurt and keeping her from sleeping. No known fever. Taking otc meds with minimal relief.   The history is provided by the patient.    Past Medical History  Diagnosis Date  . Hypertension   . Anxiety     Past Surgical History  Procedure Date  . Cesarean section   . Hernia repair     History reviewed. No pertinent family history.  History  Substance Use Topics  . Smoking status: Current Everyday Smoker  . Smokeless tobacco: Not on file  . Alcohol Use: Yes    OB History    Grav Para Term Preterm Abortions TAB SAB Ect Mult Living                  Review of Systems  Constitutional: Positive for fatigue.  HENT: Positive for congestion, sore throat, rhinorrhea, sneezing and postnasal drip. Negative for ear pain and neck pain.   Respiratory: Positive for cough. Negative for wheezing.   Cardiovascular: Negative.   Gastrointestinal: Negative.   Genitourinary: Negative.   Skin: Negative.     Allergies  Review of patient's allergies indicates no known allergies.  Home Medications   Current Outpatient Rx  Name Route Sig Dispense Refill  . LISINOPRIL 10 MG PO TABS Oral Take 10 mg by mouth daily.    . ALBUTEROL IN Inhalation Inhale into the lungs.      Marland Kitchen XANAX PO Oral Take by mouth.      . AZITHROMYCIN 250 MG PO TABS Oral Take 1 tablet (250 mg total) by mouth daily. Take first 2 tablets together, then 1 every day until finished. 6 tablet 0  . GUAIFENESIN-CODEINE 100-10 MG/5ML PO SYRP  1-2 tsp po q 6 hrs prn cough 120 mL 0  . HYDROCHLOROTHIAZIDE PO Oral Take 25 mg by mouth daily.     Marland Kitchen  VICODIN PO Oral Take by mouth.        BP 142/88  Pulse 99  Temp(Src) 98.8 F (37.1 C) (Oral)  Resp 18  SpO2 98%  LMP 12/16/2010  Physical Exam  Nursing note and vitals reviewed. Constitutional: She appears well-developed and well-nourished. No distress.  HENT:       Ears clear, nose congested, frontal and maxillary pressure, throat mild erythema with pnd  Neck: Normal range of motion. No thyromegaly present.  Cardiovascular: Normal rate and regular rhythm.   Pulmonary/Chest: Effort normal and breath sounds normal. She has no wheezes.       Congested croupy cough  Lymphadenopathy:    She has no cervical adenopathy.  Skin: Skin is warm and dry.    ED Course  Procedures (including critical care time)  Labs Reviewed - No data to display No results found.   1. Bronchitis   2. URI (upper respiratory infection)       MDM          Randa Spike, MD 04/18/11 1015

## 2011-09-19 ENCOUNTER — Other Ambulatory Visit (HOSPITAL_COMMUNITY): Payer: Self-pay | Admitting: Internal Medicine

## 2011-09-19 DIAGNOSIS — Z1231 Encounter for screening mammogram for malignant neoplasm of breast: Secondary | ICD-10-CM

## 2011-10-09 ENCOUNTER — Ambulatory Visit (HOSPITAL_COMMUNITY)
Admission: RE | Admit: 2011-10-09 | Discharge: 2011-10-09 | Disposition: A | Discharge status: Home / Self Care | Payer: Self-pay | Source: Ambulatory Visit | Attending: Internal Medicine | Admitting: Internal Medicine

## 2011-10-09 DIAGNOSIS — Z1231 Encounter for screening mammogram for malignant neoplasm of breast: Secondary | ICD-10-CM | POA: Insufficient documentation

## 2012-02-03 ENCOUNTER — Ambulatory Visit (INDEPENDENT_AMBULATORY_CARE_PROVIDER_SITE_OTHER): Payer: Self-pay | Admitting: Family Medicine

## 2012-02-03 ENCOUNTER — Encounter: Payer: Self-pay | Admitting: Family Medicine

## 2012-02-03 VITALS — BP 147/87 | HR 93 | Temp 98.8°F | Ht 62.0 in | Wt 199.2 lb

## 2012-02-03 DIAGNOSIS — Z720 Tobacco use: Secondary | ICD-10-CM

## 2012-02-03 DIAGNOSIS — F411 Generalized anxiety disorder: Secondary | ICD-10-CM

## 2012-02-03 DIAGNOSIS — I10 Essential (primary) hypertension: Secondary | ICD-10-CM

## 2012-02-03 DIAGNOSIS — F17200 Nicotine dependence, unspecified, uncomplicated: Secondary | ICD-10-CM

## 2012-02-03 NOTE — Patient Instructions (Addendum)
Thank you for coming in today, it was good to see you Please make a follow up appointment once after you have met with Britta Mccreedy I'm sorry you are dealing with so much stress right now, I hope this gets better for you.  Rest and get plenty of fluids for your cough and congestion

## 2012-02-04 ENCOUNTER — Encounter: Payer: Self-pay | Admitting: Family Medicine

## 2012-02-04 DIAGNOSIS — Z720 Tobacco use: Secondary | ICD-10-CM | POA: Insufficient documentation

## 2012-02-04 NOTE — Assessment & Plan Note (Signed)
Currently symptoms fairly well controlled with xanax. I discussed with her switching over to a longer acting benzo such as clonazepam which she is open to trying.  We will discuss this further at her next appointment as she wants to get established with the assistance card before making changes.  Can consider restarting antidepressant such as sertraline to help with anxiety symptoms as well.

## 2012-02-04 NOTE — Assessment & Plan Note (Signed)
Blood pressure slightly high today, will see what this looks like at next appointment.  If remains high will titrate medications up.  I reviewed her lab work from July done at [facility] showing normal electrolytes and creatinine of 0.57.

## 2012-02-04 NOTE — Progress Notes (Signed)
  Subjective:    Patient ID: Deborah Knight, female    DOB: 1965/09/14, 46 y.o.   MRN: [id number]  HPI Patient here as new patient to establish care.  Formerly seen at [facility]  1.  Anxiety/Depression:  History of anxiety and depression x2 years.  Was on paxil in the past but difficulty sleeping with this medication.  Currently only using alprazolam as needed.  Stressors include recently finding out who her father was and learning he had passed away.  Since finding out who her father was her mother has refused to speak to her.  She does endorse difficulty sleeping but denies anhedonia, feelings of guilt, changes in appetite, decreased energy or concentration.  She does endorse feelings of her heart racing at times when she feels anxious.  She does go out to shoot pool quite often which is a good stress reliever for her.   2.  HTN:  Currently reports being on two blood pressure medications, unsure of dosing. Previous records from [facility] indicate losartan-hctz and amlodipine.  The only one she knows of is hctz and the other she thinks is lisinopril, again unsure of dosage.   She typically does not monitor her blood pressure at home.  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  She denies chest pain, headache, weakness, shortness of breath, or vision changes.    Review of Systems Per HPI    Objective:   Physical Exam  Constitutional:       Obese female, nad   HENT:  Head: Normocephalic and atraumatic.  Mouth/Throat: Oropharynx is clear and moist.  Neck: Neck supple. No thyromegaly present.  Cardiovascular: Normal rate and regular rhythm.   Pulmonary/Chest: Effort normal and breath sounds normal.  Musculoskeletal: She exhibits no edema.  Neurological: She is alert.  Psychiatric: Her behavior is normal. Thought content normal.       Became tearful when referring to her father and distant relationship with her mother  currently.           Assessment & Plan:

## 2012-02-04 NOTE — Assessment & Plan Note (Signed)
Smokes black and mild cigars, Counseled to quit.

## 2012-03-10 ENCOUNTER — Emergency Department (HOSPITAL_COMMUNITY)
Admission: EM | Admit: 2012-03-10 | Discharge: 2012-03-10 | Disposition: A | Discharge status: Home / Self Care | Payer: Self-pay | Attending: Emergency Medicine | Admitting: Emergency Medicine

## 2012-03-10 ENCOUNTER — Encounter (HOSPITAL_COMMUNITY): Payer: Self-pay | Admitting: *Deleted

## 2012-03-10 ENCOUNTER — Emergency Department (HOSPITAL_COMMUNITY): Payer: Self-pay

## 2012-03-10 DIAGNOSIS — R072 Precordial pain: Secondary | ICD-10-CM | POA: Insufficient documentation

## 2012-03-10 DIAGNOSIS — J309 Allergic rhinitis, unspecified: Secondary | ICD-10-CM | POA: Insufficient documentation

## 2012-03-10 DIAGNOSIS — F17200 Nicotine dependence, unspecified, uncomplicated: Secondary | ICD-10-CM | POA: Insufficient documentation

## 2012-03-10 DIAGNOSIS — I10 Essential (primary) hypertension: Secondary | ICD-10-CM | POA: Insufficient documentation

## 2012-03-10 DIAGNOSIS — F411 Generalized anxiety disorder: Secondary | ICD-10-CM | POA: Insufficient documentation

## 2012-03-10 DIAGNOSIS — K219 Gastro-esophageal reflux disease without esophagitis: Secondary | ICD-10-CM | POA: Insufficient documentation

## 2012-03-10 DIAGNOSIS — J45909 Unspecified asthma, uncomplicated: Secondary | ICD-10-CM | POA: Insufficient documentation

## 2012-03-10 DIAGNOSIS — E785 Hyperlipidemia, unspecified: Secondary | ICD-10-CM | POA: Insufficient documentation

## 2012-03-10 DIAGNOSIS — Z79899 Other long term (current) drug therapy: Secondary | ICD-10-CM | POA: Insufficient documentation

## 2012-03-10 HISTORY — DX: Unspecified asthma, uncomplicated: J45.909

## 2012-03-10 LAB — BASIC METABOLIC PANEL
BUN: 10 mg/dL (ref 6–23)
Calcium: 10.2 mg/dL (ref 8.4–10.5)
GFR calc non Af Amer: >90 mL/min (ref >90)
Glucose, Bld: 83 mg/dL (ref 70–99)
Potassium: 2.9 mEq/L — ABNORMAL LOW (ref 3.5–5.1)

## 2012-03-10 LAB — CBC
HCT: 39.9 % (ref 36.0–46.0)
Hemoglobin: 13.7 g/dL (ref 12.0–15.0)
MCH: 28.7 pg (ref 26.0–34.0)
MCHC: 34.3 g/dL (ref 30.0–36.0)

## 2012-03-10 MED ORDER — GI COCKTAIL ~~LOC~~
30.0000 mL | Freq: Once | ORAL | Status: AC
  Administered 2012-03-10: 30 mL via ORAL
  Filled 2012-03-10: qty 30

## 2012-03-10 MED ORDER — OMEPRAZOLE 20 MG PO CPDR: 20.0000 mg | DELAYED_RELEASE_CAPSULE | Freq: Every day | ORAL | Status: DC

## 2012-03-10 MED ORDER — OXYCODONE-ACETAMINOPHEN 5-325 MG PO TABS
1.0000 | ORAL_TABLET | Freq: Once | ORAL | Status: AC
  Administered 2012-03-10: 1 via ORAL
  Filled 2012-03-10: qty 1

## 2012-03-10 NOTE — ED Notes (Signed)
Pt up to window c/o chest pain.  Notified triage RN.

## 2012-03-10 NOTE — ED Provider Notes (Signed)
History     CSN: [id number]  Arrival date & time 03/10/12  4010   First MD Initiated Contact with Patient 03/10/12 2017      Chief Complaint  Patient presents with  . Abdominal Pain  . Chest Pain    The history is provided by the patient.   the patient reports that for the last 3-4 days she's had constant discomfort in her epigastric region is worse when she swallows foods.  She reports when she lays flat it feels worse as well.  She has a history of acid reflux for which she is not currently on medication.  She reports water sometimes makes it feel better.  She has not tried any antacids.  She has no prior history of cardiac disease.  She does have hypertension hyperlipidemia.  No family history of early heart disease.  She has a history of asthma but reports no shortness of breath and states this does not feel like an asthma exacerbation.  Her symptoms are mild in severity.  In nothing improves her symptoms.  Her symptoms are worsened as above  Past Medical History  Diagnosis Date  . Hypertension   . Anxiety   . Seasonal allergies   . HLD (hyperlipidemia)   . Asthma     Past Surgical History  Procedure Date  . Cesarean section 1997  . Hernia repair 1989  . Tubal ligation 1997    Family History  Problem Relation Age of Onset  . Asthma Father   . Asthma Sister   . Asthma Brother   . Asthma Maternal Grandmother   . Asthma Maternal Grandfather   . Asthma Paternal Grandmother   . Asthma Paternal Grandfather   . Cancer Father   . Hyperlipidemia Mother   . Hyperlipidemia Sister     History  Substance Use Topics  . Smoking status: Current Every Day Smoker    Types: Cigars  . Smokeless tobacco: Not on file  . Alcohol Use: Yes    OB History    Grav Para Term Preterm Abortions TAB SAB Ect Mult Living                  Review of Systems  All other systems reviewed and are negative.    Allergies  Review of patient's allergies indicates no known  allergies.  Home Medications   Current Outpatient Rx  Name  Route  Sig  Dispense  Refill  . ALBUTEROL SULFATE HFA 108 (90 BASE) MCG/ACT IN AERS   Inhalation   Inhale 2 puffs into the lungs every 6 (six) hours as needed. Shortness of breath         . ALPRAZOLAM 0.5 MG PO TABS   Oral   Take 0.5 mg by mouth 3 (three) times daily as needed. Anxiety         . FLUTICASONE-SALMETEROL 115-21 MCG/ACT IN AERO   Inhalation   Inhale 2 puffs into the lungs 2 (two) times daily.         Marland Kitchen HYDROCHLOROTHIAZIDE PO   Oral   Take 25 mg by mouth daily.          Marland Kitchen HYDROCODONE-ACETAMINOPHEN 5-325 MG PO TABS   Oral   Take 1 tablet by mouth every 6 (six) hours as needed. pain         . LISINOPRIL 10 MG PO TABS   Oral   Take 10 mg by mouth daily.         Marland Kitchen MONTELUKAST SODIUM 10  MG PO TABS   Oral   Take 10 mg by mouth at bedtime.           BP 130/90  Pulse 92  Temp 97.7 F (36.5 C) (Oral)  Resp 20  SpO2 100%  Physical Exam  Nursing note and vitals reviewed. Constitutional: She is oriented to person, place, and time. She appears well-developed and well-nourished. No distress.  HENT:  Head: Normocephalic and atraumatic.  Eyes: EOM are normal.  Neck: Normal range of motion.  Cardiovascular: Normal rate, regular rhythm and normal heart sounds.   Pulmonary/Chest: Effort normal and breath sounds normal.  Abdominal: Soft. She exhibits no distension. There is no tenderness.  Musculoskeletal: Normal range of motion.  Neurological: She is alert and oriented to person, place, and time.  Skin: Skin is warm and dry.  Psychiatric: She has a normal mood and affect. Judgment normal.    ED Course  Procedures (including critical care time)   Date: 03/10/2012  Rate: 79  Rhythm: normal sinus rhythm  QRS Axis: normal  Intervals: normal  ST/T Wave abnormalities: normal  Conduction Disutrbances: none  Narrative Interpretation:   Old EKG Reviewed: No significant changes  noted     Labs Reviewed  BASIC METABOLIC PANEL - Abnormal; Notable for the following:    Potassium 2.9 (*)     All other components within normal limits  CBC  TROPONIN I  LIPASE, BLOOD   Dg Chest 2 View  03/10/2012  *RADIOLOGY REPORT*  Clinical Data: Chest pain.  CHEST - 2 VIEW  Comparison:  06/14/2010  Findings:  The heart size and mediastinal contours are within normal limits.  Both lungs are clear.  The visualized skeletal structures are unremarkable.  IMPRESSION: No active cardiopulmonary disease.   Original Report Authenticated By: Myles Rosenthal, M.D.    I personally reviewed the imaging tests through PACS system I reviewed available ER/hospitalization records through the EMR   1. GERD (gastroesophageal reflux disease)       MDM  The patient's discomfort seems more like GERD.  Is worse when she lays back.  EKG and troponin are normal.  The patient's potassium is 2.9 I recommend that she followup with her Dr. regarding this.  Doubt ACS.  Doubt pulmonary embolism.  PERC negative.  The patient was very adamant that she was ready to go home and felt somewhat better after the GI cocktail.  She reports "my ride is leaving and I have to leave"        Lyanne Co, MD 03/10/12 2306

## 2012-03-10 NOTE — ED Notes (Signed)
Pt c/o sharp burning pain, mid-sternal, under L breast, radiating to back. Pt states hx of asthma and acid reflux for which she has been taking her medications as prescribed. Pt states pain "comes and goes". Worse pain w/ swallowing and lying down.

## 2012-03-25 ENCOUNTER — Telehealth: Payer: Self-pay | Admitting: Family Medicine

## 2012-03-25 NOTE — Telephone Encounter (Signed)
Left message for patient to return call. Need more clarification on which medications she is taking for BP. She should not be taking both.Busick, Deborah Knight

## 2012-03-25 NOTE — Telephone Encounter (Signed)
Received refill request from [facility] for Losartan-HCTZ 100/25mg  as well as amlodipine.  At previous appointment patient did not bring medications and was unsure of what meds she was taking.  She thought she was on lisinopril and hctz.  She has not followed up since that time.  She should be using lisinopril and hctz OR losartan hctz, not both.  Please confirm which one she is using and I will send in.  She needs follow up within the next couple of weeks though to determine if she still needs amlodipine in addition to her other blood pressure medications.   Thanks!

## 2012-03-25 NOTE — Telephone Encounter (Signed)
Spoke w/pt. Still unsure of meds she is taking. Pt agreed to call me back when she gets home later today and clarify.

## 2012-03-26 ENCOUNTER — Telehealth: Payer: Self-pay | Admitting: Family Medicine

## 2012-03-26 NOTE — Telephone Encounter (Signed)
Patient is taking Losartin HCTZ and another medication that I didn't see listed Amlodipine Desylate.

## 2012-03-26 NOTE — Telephone Encounter (Signed)
Pt returned call-states she is on Losartan HCTZ 100/25 1 QD and amlodipine desylate 10mg  1 QD. She has only 1 pill left of each. Would you please refill to [facility]. Thank You.

## 2012-03-27 MED ORDER — LOSARTAN POTASSIUM-HCTZ 100-25 MG PO TABS: 1.0000 | ORAL_TABLET | Freq: Every day | ORAL | Status: DC

## 2012-03-27 MED ORDER — AMLODIPINE BESYLATE 10 MG PO TABS: 10.0000 mg | ORAL_TABLET | Freq: Every day | ORAL | Status: DC

## 2012-03-27 NOTE — Addendum Note (Signed)
Addended by: Everrett Coombe on: 03/27/2012 09:06 AM   Modules accepted: Orders

## 2012-03-27 NOTE — Telephone Encounter (Signed)
Rx sent in

## 2012-04-07 ENCOUNTER — Ambulatory Visit (INDEPENDENT_AMBULATORY_CARE_PROVIDER_SITE_OTHER): Payer: No Typology Code available for payment source | Admitting: Family Medicine

## 2012-04-07 ENCOUNTER — Encounter: Payer: Self-pay | Admitting: Family Medicine

## 2012-04-07 ENCOUNTER — Telehealth: Payer: Self-pay | Admitting: *Deleted

## 2012-04-07 VITALS — BP 125/86 | HR 84 | Temp 99.0°F | Ht 62.0 in | Wt 200.4 lb

## 2012-04-07 DIAGNOSIS — F3289 Other specified depressive episodes: Secondary | ICD-10-CM

## 2012-04-07 DIAGNOSIS — R221 Localized swelling, mass and lump, neck: Secondary | ICD-10-CM

## 2012-04-07 DIAGNOSIS — R220 Localized swelling, mass and lump, head: Secondary | ICD-10-CM

## 2012-04-07 DIAGNOSIS — L509 Urticaria, unspecified: Secondary | ICD-10-CM | POA: Insufficient documentation

## 2012-04-07 DIAGNOSIS — R21 Rash and other nonspecific skin eruption: Secondary | ICD-10-CM

## 2012-04-07 DIAGNOSIS — F329 Major depressive disorder, single episode, unspecified: Secondary | ICD-10-CM

## 2012-04-07 MED ORDER — RANITIDINE HCL 300 MG PO TABS: 300.0000 mg | ORAL_TABLET | Freq: Every day | ORAL | Status: DC

## 2012-04-07 MED ORDER — TRIAMCINOLONE ACETONIDE 0.1 % EX CREA: TOPICAL_CREAM | Freq: Two times a day (BID) | CUTANEOUS | Status: DC

## 2012-04-07 MED ORDER — LORATADINE 10 MG PO TABS: 10.0000 mg | ORAL_TABLET | Freq: Every day | ORAL | Status: DC

## 2012-04-07 NOTE — Patient Instructions (Addendum)
It was nice to meet you today.  I'm sorry that you have so much stress.  I recommend that you meet with our psychologist, Dr. Spero Geralds, for help dealing with your depression and stress.  You can schedule an appointment with her by calling her directly at [phone]. I agree that you need to be on an every day medicine, however, I think we should wait to start one until the swelling in your face is better.  For the swelling, STOP your losartan/HCTZ medicine.  It could be the culprit. I am also giving you 2 medicines to take by mouth every day for the next week or so until the swelling gets better.  They are both $4 at [facility]. I am giving you a cream that you can put on the rash that is also $4 at [facility].  Keep your appointment with Dr. Ashley Royalty on Thursday at 2pm.  He will make sure the swelling looks better and can start a medicine for your mood.   Go to the ER if you feel like your throat is closing, you start having tingling in your lips or tongue, feel like you can't swallow, or start to have a hard time breathing.

## 2012-04-07 NOTE — Telephone Encounter (Signed)
Patient called stating she has had hives and itching generalized over body for couple of days. This AM around 6:20 woke up and right side of face felt numb like it had been injected with Novocaine. Also seems swollen. Numbness seems to be extending now to lips. Denies trouble breathing, no swelling of throat or tongue . Consulted with Dr. Deirdre Priest and he advises for patient to come to office now.

## 2012-04-07 NOTE — Assessment & Plan Note (Signed)
Could be angioedema due to losartan, will d/c for now and start H1+H2 blockers.  Could be Bell's palsy, although CNs all normal, will see if it progresses in 2 days at f/u with PCP.  Unlikely to be other allergic reaction given no new medicines/foods/etc. PCP can decide if he wants to retry losartan in the future given this was a mild reaction.   Precepted with Dr. Deirdre Priest.

## 2012-04-07 NOTE — Progress Notes (Signed)
S: Pt comes in today for SDA for hives.  Patient reports hives and swelling on her face and chest for the past 2 days.  She thinks that it is related to stress.  She denies any new foods, medicines, perfumes, lotions, soaps, etc.  She denies SOB, swelling of tongue/lips, itching in throat, or difficulty swallowing.  She has tried some cortisone on her hands, tried benadryl last night which just made her sleepy but didn't help with itching.  + itching in chest, genital area, neck, face.  Does have a cough/cold right now- rhinorrhea, congestion, some phlegm.  No fevers/chills.  Lives with son, who has not had any issues with rash or itching.    She woke up this morning and right side of face was swollen and feels numb.  No pain, no tooth issues.  Went to work this AM and the swelling/numbness started to move from her cheek toward her lips. Is on ARB.   Has had a lot of increased stress with her family and her mom-- found her dad's family and her mom is really mad at her for this.  Found out her dad passed away 4 years ago.    Is currently taking Prilosec, is not taking her Singulair.    With regards to depression, has thoughts of wishing she was dead but strongly denies SI/HI (wants to go to heaven, would never kill herself or harm herself).  Has tried Paxil previously but did not like it because it made her feel funny.  Is able to contract for safety.    ROS: Per HPI  History  Smoking status  . Current Every Day Smoker  . Types: Cigars  Smokeless tobacco  . Not on file    O:  Filed Vitals:   04/07/12 0930  BP: 125/86  Pulse: 84  Temp: 99 F (37.2 C)    Gen: NAD, tearful and crying at times HEENT: + swelling right cheek without rash or tenderness, no erythema or signs of infection, throat clear without swelling or erythema, + redundant tissue inside of mouth on right side, poor dentition but no obvious infection  CV: RRR, no murmur Pulm: CTA bilat, no wheezes, no increased WOB Skin: +  small erythematous papules over mid chest with excoriation, + similar papules just anterior to pubic hair in lower abdomen, no rash over hands/fingers   A/P: 47 y.o. female p/w rash, facial swelling, depression -See problem list -f/u in 2 days with PCP

## 2012-04-07 NOTE — Assessment & Plan Note (Signed)
Nonspecific reaction, will try steroid cream.  Consider scabies treatment if it progresses.

## 2012-04-07 NOTE — Assessment & Plan Note (Signed)
Given Dr. Carola Rhine information.  Able to contract for safety.  Would benefit from SSRI/SNRI.  Discussed starting one today with patient, but given the facial swelling, will wait to start a new medicine until this resolves.  Should have PHQ-9 prior to start treatment.

## 2012-04-09 ENCOUNTER — Ambulatory Visit (INDEPENDENT_AMBULATORY_CARE_PROVIDER_SITE_OTHER): Payer: No Typology Code available for payment source | Admitting: Family Medicine

## 2012-04-09 ENCOUNTER — Other Ambulatory Visit (HOSPITAL_COMMUNITY)
Admission: RE | Admit: 2012-04-09 | Discharge: 2012-04-09 | Disposition: A | Discharge status: Home / Self Care | Payer: No Typology Code available for payment source | Source: Ambulatory Visit | Attending: Family Medicine | Admitting: Family Medicine

## 2012-04-09 ENCOUNTER — Encounter: Payer: Self-pay | Admitting: Family Medicine

## 2012-04-09 VITALS — BP 131/87 | HR 102 | Temp 99.0°F | Ht 62.0 in | Wt 202.0 lb

## 2012-04-09 DIAGNOSIS — Z01419 Encounter for gynecological examination (general) (routine) without abnormal findings: Secondary | ICD-10-CM | POA: Insufficient documentation

## 2012-04-09 DIAGNOSIS — Z1151 Encounter for screening for human papillomavirus (HPV): Secondary | ICD-10-CM | POA: Insufficient documentation

## 2012-04-09 DIAGNOSIS — I10 Essential (primary) hypertension: Secondary | ICD-10-CM

## 2012-04-09 DIAGNOSIS — Z124 Encounter for screening for malignant neoplasm of cervix: Secondary | ICD-10-CM

## 2012-04-09 DIAGNOSIS — Z23 Encounter for immunization: Secondary | ICD-10-CM

## 2012-04-09 DIAGNOSIS — E785 Hyperlipidemia, unspecified: Secondary | ICD-10-CM

## 2012-04-09 LAB — CBC
HCT: 39 % (ref 36.0–46.0)
Hemoglobin: 13.1 g/dL (ref 12.0–15.0)
MCHC: 33.6 g/dL (ref 30.0–36.0)
MCV: 86.9 fL (ref 78.0–100.0)
WBC: 6.3 10*3/uL (ref 4.0–10.5)

## 2012-04-09 NOTE — Patient Instructions (Addendum)
Thank you for coming in today, it was good to see you I would like for you to make an appointment in the next couple of weeks to discuss your mood We can review your lab work at that time as well Your blood pressure looks fine on amlodipine alone today.  We can reassess at your next visit.

## 2012-04-10 LAB — BASIC METABOLIC PANEL
CO2: 31 mEq/L (ref 19–32)
Calcium: 9.6 mg/dL (ref 8.4–10.5)
Potassium: 3.6 mEq/L (ref 3.5–5.3)
Sodium: 144 mEq/L (ref 135–145)

## 2012-04-10 LAB — LDL CHOLESTEROL, DIRECT: Direct LDL: 222 mg/dL — ABNORMAL HIGH

## 2012-04-13 ENCOUNTER — Ambulatory Visit (INDEPENDENT_AMBULATORY_CARE_PROVIDER_SITE_OTHER): Payer: No Typology Code available for payment source | Admitting: Family Medicine

## 2012-04-13 ENCOUNTER — Encounter: Payer: Self-pay | Admitting: Family Medicine

## 2012-04-13 VITALS — BP 132/90 | HR 93 | Ht 62.0 in | Wt 201.0 lb

## 2012-04-13 DIAGNOSIS — E785 Hyperlipidemia, unspecified: Secondary | ICD-10-CM

## 2012-04-13 DIAGNOSIS — F329 Major depressive disorder, single episode, unspecified: Secondary | ICD-10-CM

## 2012-04-13 DIAGNOSIS — M545 Low back pain: Secondary | ICD-10-CM

## 2012-04-13 DIAGNOSIS — K219 Gastro-esophageal reflux disease without esophagitis: Secondary | ICD-10-CM

## 2012-04-13 MED ORDER — SERTRALINE HCL 50 MG PO TABS: 50.0000 mg | ORAL_TABLET | Freq: Every day | ORAL | Status: DC

## 2012-04-13 MED ORDER — CYCLOBENZAPRINE HCL 10 MG PO TABS: 10.0000 mg | ORAL_TABLET | Freq: Three times a day (TID) | ORAL | Status: DC | PRN

## 2012-04-13 MED ORDER — PRAVASTATIN SODIUM 40 MG PO TABS: 40.0000 mg | ORAL_TABLET | Freq: Every day | ORAL | Status: DC

## 2012-04-13 MED ORDER — OMEPRAZOLE 20 MG PO CPDR: 20.0000 mg | DELAYED_RELEASE_CAPSULE | Freq: Every day | ORAL | Status: DC

## 2012-04-13 MED ORDER — HYDROCODONE-ACETAMINOPHEN 5-325 MG PO TABS: 1.0000 | ORAL_TABLET | Freq: Four times a day (QID) | ORAL | Status: DC | PRN

## 2012-04-13 NOTE — Progress Notes (Addendum)
  Subjective:    Patient ID: Deborah Knight, female    DOB: [birth year], 47 y.o.   MRN: [id number]  HPI  1. Depression/anxiety:  Worsening symptoms of depression over the past few months.  Much of this stems from the conflicts with her mother regarding her father.  She endorses that she does not sleep well and feels tired throughout the day.  She does feel some guilt especially regarding not finding out who her father was sooner.  She expresses anhedonia towards various things she previously enjoyed, however she does still enjoy shooting pool.  She does have a passive death wish but would never hurt herself.  She can not recall an instance where she went a period of time without sleeping and felt fine or times when she felt like she had lots of energy or made poor high risk choices.   PHQ 9: 13 with score of 1 for question 9.  Denies thoughts of SI or plan, more of a passive death wish.   2. 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  Using hydrocodone but not very often as well as occasional flexeril.   Pain scale rating at its BEST in the past 24 hours (1 to 10): 3 Pain scale rating at its WORST in the past 24 hours (1 to 10): 8 Adverse Effects:  (None_x_ Nausea__ Vomiting__ Confusion__ Sleepiness__ Fatigue__ Constipation__ Other__) Treatment of Adverse Effects: n/a Since the last clinic visit, how much relief have pain treatment and medication provided? Please circle the one percentage that shows how much relief you have received: 0%__ 10%__ 20%__ 30%__ 40%__ 50%__ 60%_x_ 70%__ 80%__ 90%__ 100%__ Loraine Leriche the one number that describes how, during the past 24 hours, pain has interfered with your: General Activity 0__ 1__ 2__ 3_x_ 4__ 5__ 6__ 7__ 8__ 9__ 10__ Does not interfere      Completely interferes Mood 0__ 1__ 2__ 3_x_ 4__ 5__ 6__ 7__ 8__ 9__ 10__ Does not interfere      Completely interferes Ability to work (in or out of the  home) 0__ 1__ 2__ 3_x_ 4__ 5__ 6__ 7__ 8__ 9__ 10__ Does not interfere      Completely interferes Interactions with other people 0__ 1__ 2_x 3__ 4__ 5__ 6__ 7__ 8__ 9__ 10__ Does not interfere      Completely interferes Sleep 0__ 1__ 2__ 3__ 4__ 5_x_ 6__ 7__ 8__ 9__ 10__ Does not interfere      Completely interferes  Enjoyment of life 0__ 1__ 2__ 3_x_ 4__ 5__ 6__ 7__ 8__ 9__ 10__ Does not interfere      Completely interferes   Review of Systems Per HPI    Objective:   Physical Exam  Constitutional: She appears well-nourished. No distress.  HENT:  Head: Normocephalic and atraumatic.  Musculoskeletal:       Mild spasm in lower back.  SLR negative.  ROM normal in all planes.    Neurological: She is alert.  Psychiatric:       Patient tearful at times, especially when talking about her father.  Mood and affect seem depressed.  Her behavior is normal and her thought and judgement seem to be appropriate.           Assessment & Plan:

## 2012-04-13 NOTE — Patient Instructions (Addendum)
Thank you for coming in today, it was good to see you I have sent your cholesterol and depression medication to [facility] on [address] I have sent your omeprazole to [facility] See me again in two weeks  Cholesterol Cholesterol is a type of fat. Your body needs a small amount of cholesterol, but too much can cause health problems. Certain problems include heart attacks, strokes, and not enough blood flow to your heart, brain, kidneys, or feet. You get cholesterol in 2 ways:  Naturally.  By eating certain foods. HOME CARE  Eat a low-fat diet:  Eat less eggs, whole dairy products (whole milk, cheese, and butter), fatty meats, and fried foods.  Eat more fruits, vegetables, whole-wheat breads, lean chicken, and fish.  Follow your exercise program as told by your doctor.  Keep your weight at a healthy level. Talk to your doctor about what is right for you.  Only take medicine as told by your doctor.  Get your cholesterol checked once a year or as told by your doctor. MAKE SURE YOU:  Understand these instructions.  Will watch your condition.  Will get help right away if you are not doing well or get worse. Document Released: 05/24/2008 Document Revised: 05/20/2011 Document Reviewed: 05/24/2008 [facility] Patient Information 2013 [place], [place].

## 2012-04-16 NOTE — Assessment & Plan Note (Signed)
Will refill hydrocodone for now but discussed with her that this will not be a long term medication.  If she feels like she needs this medication long term will refer to pain clinic.

## 2012-04-16 NOTE — Assessment & Plan Note (Signed)
Currently only on xanax.  Plan is  to start sertraline and wean her off xanax.  Will have her f/u in 1-2 weeks, will likely titrate sertraline up if tolerating well.

## 2012-04-17 NOTE — Progress Notes (Signed)
Patient ID: Deborah Knight, female   DOB: 1965-05-20, 47 y.o.   MRN: [id number] SUBJECTIVE:  47 y.o. female for annual routine Pap and checkup. Current Outpatient Prescriptions  Medication Sig Dispense Refill  . albuterol (PROVENTIL HFA;VENTOLIN HFA) 108 (90 BASE) MCG/ACT inhaler Inhale 2 puffs into the lungs every 6 (six) hours as needed. Shortness of breath      . ALPRAZolam (XANAX) 0.5 MG tablet Take 0.5 mg by mouth 3 (three) times daily as needed. Anxiety      . amLODipine (NORVASC) 10 MG tablet Take 1 tablet (10 mg total) by mouth daily.  30 tablet  6  . cyclobenzaprine (FLEXERIL) 10 MG tablet Take 1 tablet (10 mg total) by mouth 3 (three) times daily as needed for muscle spasms.  30 tablet  0  . fluticasone-salmeterol (ADVAIR HFA) 115-21 MCG/ACT inhaler Inhale 2 puffs into the lungs 2 (two) times daily.      Marland Kitchen HYDROcodone-acetaminophen (NORCO/VICODIN) 5-325 MG per tablet Take 1 tablet by mouth every 6 (six) hours as needed. pain  30 tablet  2  . loratadine (CLARITIN) 10 MG tablet Take 1 tablet (10 mg total) by mouth daily.  30 tablet  0  . montelukast (SINGULAIR) 10 MG tablet Take 10 mg by mouth at bedtime.      Marland Kitchen omeprazole (PRILOSEC) 20 MG capsule Take 1 capsule (20 mg total) by mouth daily.  180 capsule  1  . pravastatin (PRAVACHOL) 40 MG tablet Take 1 tablet (40 mg total) by mouth daily.  90 tablet  1  . ranitidine (ZANTAC) 300 MG tablet Take 1 tablet (300 mg total) by mouth at bedtime.  30 tablet  0  . sertraline (ZOLOFT) 50 MG tablet Take 1 tablet (50 mg total) by mouth daily.  90 tablet  1  . triamcinolone cream (KENALOG) 0.1 % Apply topically 2 (two) times daily.  80 g  0   Allergies: Review of patient's allergies indicates no known allergies.  Patient's last menstrual period was 11/08/2011.  ROS:  Feeling well. No dyspnea or chest pain on exertion.  No abdominal pain, change in bowel habits, black or bloody stools.  No urinary tract symptoms. GYN ROS: no breast pain or new or  enlarging lumps on self exam, she complains of periods becoming more spaced out with some intermittent bleeding. No neurological complaints.  OBJECTIVE:  The patient appears well, alert, oriented x 3, in no distress. BP 131/87  Pulse 102  Temp 99 F (37.2 C) (Oral)  Ht 5\' 2"  (1.575 m)  Wt 202 lb (91.627 kg)  BMI 36.95 kg/m2  LMP 11/08/2011 ENT normal.  Neck supple. No adenopathy or thyromegaly. PERLA. Lungs are clear, good air entry, no wheezes, rhonchi or rales. S1 and S2 normal, no murmurs, regular rate and rhythm. Abdomen soft without tenderness, guarding, mass or organomegaly. Extremities show no edema, normal peripheral pulses. Neurological is normal, no focal findings.   PELVIC EXAM: normal external genitalia, vulva, vagina, cervix, uterus and adnexa, PAP: Pap smear done today, exam chaperoned by Arlyss Repress   ASSESSMENT:  well woman  PLAN:  mammogram pap smear counseled on mammography screening, menopause, osteoporosis and adequate intake of calcium and vitamin D additional lab tests per orders return annually or prn

## 2012-04-27 ENCOUNTER — Ambulatory Visit (INDEPENDENT_AMBULATORY_CARE_PROVIDER_SITE_OTHER): Payer: No Typology Code available for payment source | Admitting: Family Medicine

## 2012-04-27 ENCOUNTER — Encounter: Payer: Self-pay | Admitting: Family Medicine

## 2012-04-27 VITALS — BP 132/88 | HR 90 | Ht 62.0 in | Wt 203.0 lb

## 2012-04-27 DIAGNOSIS — I10 Essential (primary) hypertension: Secondary | ICD-10-CM

## 2012-04-27 DIAGNOSIS — Z720 Tobacco use: Secondary | ICD-10-CM

## 2012-04-27 DIAGNOSIS — F17200 Nicotine dependence, unspecified, uncomplicated: Secondary | ICD-10-CM

## 2012-04-27 DIAGNOSIS — F329 Major depressive disorder, single episode, unspecified: Secondary | ICD-10-CM

## 2012-04-27 DIAGNOSIS — F3289 Other specified depressive episodes: Secondary | ICD-10-CM

## 2012-04-27 MED ORDER — HYDROXYZINE HCL 25 MG PO TABS: 25.0000 mg | ORAL_TABLET | Freq: Every evening | ORAL | Status: DC | PRN

## 2012-04-27 NOTE — Patient Instructions (Addendum)
Thank you for coming in today, it was good to see you Pick up the zoloft and start, I will see you back in two weeks Try to continue to cut back on your smoking I recommend that you meet with our psychologist, Dr. Spero Geralds, for help dealing with your anxiety and depression.  You can schedule an appointment with her by calling her directly at [phone].

## 2012-04-27 NOTE — Assessment & Plan Note (Signed)
Reminded to pick up sertraline and start this medication.  Will plan to wean xanax once she is on a stable dose of sertraline. She plans to make appointment with Dr. Pascal Lux.  Follow up with me in 2 weeks.

## 2012-04-27 NOTE — Progress Notes (Signed)
  Subjective:    Patient ID: Deborah Knight, female    DOB: 03-06-1966, 47 y.o.   MRN: [id number]  HPI 1. Depression/Anxiety: F/u on depression and anxiety.  Currently only using xanax.  Has not picked up zoloft yet but plans to get this week.  She was stressed out by being snowbound last week.  She is learning to cope with things she knows she can not change.  She plans make an appointment to see Dr. Pascal Lux soon. She does not have any SI or HI currently.   2. HTN:  Tolerating medication well.  Does not self monitor BP at home.  Denies chest pain, palpitations, headache, or vision changes.    Review of Systems Per HPI    Objective:   Physical Exam  Constitutional: She appears well-nourished. No distress.  HENT:  Head: Normocephalic and atraumatic.  Neck: Neck supple. No thyromegaly present.  Cardiovascular: Normal rate and regular rhythm.   Pulmonary/Chest: Effort normal and breath sounds normal.  Musculoskeletal: She exhibits no edema.  Neurological: She is alert.  Psychiatric: She has a normal mood and affect. Her behavior is normal. Judgment and thought content normal.          Assessment & Plan:

## 2012-04-27 NOTE — Assessment & Plan Note (Signed)
Still smoking one black and mild per day.  Counseled on quitting.

## 2012-04-27 NOTE — Assessment & Plan Note (Signed)
BP is well controlled today.  Will make no changes to medications.

## 2012-05-19 ENCOUNTER — Encounter (HOSPITAL_COMMUNITY): Payer: Self-pay | Admitting: Emergency Medicine

## 2012-05-19 ENCOUNTER — Emergency Department (HOSPITAL_COMMUNITY)
Admission: EM | Admit: 2012-05-19 | Discharge: 2012-05-19 | Disposition: A | Discharge status: Home / Self Care | Payer: Worker's Compensation | Attending: Emergency Medicine | Admitting: Emergency Medicine

## 2012-05-19 ENCOUNTER — Emergency Department (HOSPITAL_COMMUNITY): Payer: Worker's Compensation

## 2012-05-19 DIAGNOSIS — F17200 Nicotine dependence, unspecified, uncomplicated: Secondary | ICD-10-CM | POA: Insufficient documentation

## 2012-05-19 DIAGNOSIS — J45909 Unspecified asthma, uncomplicated: Secondary | ICD-10-CM | POA: Insufficient documentation

## 2012-05-19 DIAGNOSIS — Y990 Civilian activity done for income or pay: Secondary | ICD-10-CM | POA: Insufficient documentation

## 2012-05-19 DIAGNOSIS — S43016A Anterior dislocation of unspecified humerus, initial encounter: Secondary | ICD-10-CM | POA: Insufficient documentation

## 2012-05-19 DIAGNOSIS — I10 Essential (primary) hypertension: Secondary | ICD-10-CM | POA: Insufficient documentation

## 2012-05-19 DIAGNOSIS — E785 Hyperlipidemia, unspecified: Secondary | ICD-10-CM | POA: Insufficient documentation

## 2012-05-19 DIAGNOSIS — Z79899 Other long term (current) drug therapy: Secondary | ICD-10-CM | POA: Insufficient documentation

## 2012-05-19 DIAGNOSIS — Y9389 Activity, other specified: Secondary | ICD-10-CM | POA: Insufficient documentation

## 2012-05-19 DIAGNOSIS — IMO0002 Reserved for concepts with insufficient information to code with codable children: Secondary | ICD-10-CM | POA: Insufficient documentation

## 2012-05-19 DIAGNOSIS — F411 Generalized anxiety disorder: Secondary | ICD-10-CM | POA: Insufficient documentation

## 2012-05-19 DIAGNOSIS — W010XXA Fall on same level from slipping, tripping and stumbling without subsequent striking against object, initial encounter: Secondary | ICD-10-CM | POA: Insufficient documentation

## 2012-05-19 DIAGNOSIS — Y9289 Other specified places as the place of occurrence of the external cause: Secondary | ICD-10-CM | POA: Insufficient documentation

## 2012-05-19 MED ORDER — METHOCARBAMOL 500 MG PO TABS: 1000.0000 mg | ORAL_TABLET | Freq: Four times a day (QID) | ORAL | Status: DC

## 2012-05-19 MED ORDER — METOCLOPRAMIDE HCL 5 MG/ML IJ SOLN
10.0000 mg | Freq: Once | INTRAMUSCULAR | Status: AC
  Administered 2012-05-19: 10 mg via INTRAVENOUS
  Filled 2012-05-19: qty 2

## 2012-05-19 MED ORDER — ONDANSETRON HCL 4 MG/2ML IJ SOLN
4.0000 mg | Freq: Once | INTRAMUSCULAR | Status: AC
  Administered 2012-05-19: 4 mg via INTRAVENOUS
  Filled 2012-05-19: qty 2

## 2012-05-19 MED ORDER — HYDROMORPHONE HCL PF 1 MG/ML IJ SOLN
1.0000 mg | Freq: Once | INTRAMUSCULAR | Status: AC
  Administered 2012-05-19: 1 mg via INTRAVENOUS
  Filled 2012-05-19: qty 1

## 2012-05-19 MED ORDER — KETAMINE HCL 10 MG/ML IJ SOLN
1.0000 mg/kg | Freq: Once | INTRAMUSCULAR | Status: AC
  Administered 2012-05-19: 130 mg via INTRAVENOUS

## 2012-05-19 MED ORDER — IBUPROFEN 600 MG PO TABS: 600.0000 mg | ORAL_TABLET | Freq: Four times a day (QID) | ORAL | Status: DC | PRN

## 2012-05-19 MED ORDER — MIDAZOLAM HCL 2 MG/2ML IJ SOLN: 5.0000 mg | Freq: Once | INTRAMUSCULAR | Status: DC

## 2012-05-19 MED ORDER — OXYCODONE-ACETAMINOPHEN 5-325 MG PO TABS: 1.0000 | ORAL_TABLET | Freq: Four times a day (QID) | ORAL | Status: DC | PRN

## 2012-05-19 MED ORDER — PROMETHAZINE HCL 25 MG PO TABS: 25.0000 mg | ORAL_TABLET | Freq: Four times a day (QID) | ORAL | Status: DC | PRN

## 2012-05-19 MED ORDER — KETAMINE HCL 10 MG/ML IJ SOLN
INTRAMUSCULAR | Status: AC | PRN
  Administered 2012-05-19: 136.05 mg via INTRAVENOUS

## 2012-05-19 MED ORDER — KETAMINE HCL 10 MG/ML IJ SOLN
INTRAMUSCULAR | Status: DC | PRN
  Administered 2012-05-19: 90.7 mg via INTRAVENOUS

## 2012-05-19 MED ORDER — MIDAZOLAM HCL 2 MG/2ML IJ SOLN
INTRAMUSCULAR | Status: AC
  Administered 2012-05-19: 2 mg
  Filled 2012-05-19: qty 2

## 2012-05-19 NOTE — ED Notes (Signed)
Per EMS: Pt tripped over some toys and fell while at work.  When EMS arrived, pt had obvious left shoulder deformity.  20g in R hand.  250 mcg fentanyl given with no relief.  Pain 10/10.

## 2012-05-19 NOTE — ED Provider Notes (Signed)
History     CSN: [id number]  Arrival date & time 05/19/12  1344   None     Chief Complaint  Patient presents with  . Shoulder Injury    (Consider location/radiation/quality/duration/timing/severity/associated sxs/prior treatment) HPI Comments: Patient with remote history of shoulder dislocation presents with complaint of left shoulder pain and deformity that occurred acutely after tripping at work today. Patient was transported to the emergency department by EMS who administered 250 mcg of fentanyl. She complains of severe pain. She denies other injury. No head or neck pain. Onset of symptoms acute. Course is constant. Nothing makes symptoms better. Movement and palpation makes the pain worse. Patient last ate several hours ago.  The history is provided by the patient.    Past Medical History  Diagnosis Date  . Hypertension   . Anxiety   . Seasonal allergies   . HLD (hyperlipidemia)   . Asthma     Past Surgical History  Procedure Laterality Date  . Cesarean section  1997  . Hernia repair  1989  . Tubal ligation  1997    Family History  Problem Relation Age of Onset  . Asthma Father   . Asthma Sister   . Asthma Brother   . Asthma Maternal Grandmother   . Asthma Maternal Grandfather   . Asthma Paternal Grandmother   . Asthma Paternal Grandfather   . Cancer Father   . Hyperlipidemia Mother   . Hyperlipidemia Sister     History  Substance Use Topics  . Smoking status: Current Every Day Smoker    Types: Cigars  . Smokeless tobacco: Not on file  . Alcohol Use: Yes    OB History   Grav Para Term Preterm Abortions TAB SAB Ect Mult Living                  Review of Systems  Constitutional: Positive for activity change. Negative for fever.  HENT: Negative for sore throat, rhinorrhea and neck pain.   Eyes: Negative for redness.  Respiratory: Negative for cough.   Cardiovascular: Negative for chest pain.  Gastrointestinal: Negative for nausea, vomiting,  abdominal pain and diarrhea.  Genitourinary: Negative for dysuria.  Musculoskeletal: Positive for arthralgias. Negative for myalgias, back pain and joint swelling.  Skin: Negative for rash and wound.  Neurological: Negative for weakness, numbness and headaches.    Allergies  Review of patient's allergies indicates no known allergies.  Home Medications   Current Outpatient Rx  Name  Route  Sig  Dispense  Refill  . albuterol (PROVENTIL HFA;VENTOLIN HFA) 108 (90 BASE) MCG/ACT inhaler   Inhalation   Inhale 2 puffs into the lungs every 6 (six) hours as needed. Shortness of breath         . ALPRAZolam (XANAX) 0.5 MG tablet   Oral   Take 0.5 mg by mouth 3 (three) times daily as needed. Anxiety         . amLODipine (NORVASC) 10 MG tablet   Oral   Take 1 tablet (10 mg total) by mouth daily.   30 tablet   6   . cyclobenzaprine (FLEXERIL) 10 MG tablet   Oral   Take 1 tablet (10 mg total) by mouth 3 (three) times daily as needed for muscle spasms.   30 tablet   0   . fluticasone-salmeterol (ADVAIR HFA) 115-21 MCG/ACT inhaler   Inhalation   Inhale 2 puffs into the lungs 2 (two) times daily.         Marland Kitchen HYDROcodone-acetaminophen (  NORCO/VICODIN) 5-325 MG per tablet   Oral   Take 1 tablet by mouth every 6 (six) hours as needed. pain   30 tablet   2   . hydrOXYzine (ATARAX/VISTARIL) 25 MG tablet   Oral   Take 1 tablet (25 mg total) by mouth at bedtime as needed for itching.   30 tablet   0   . loratadine (CLARITIN) 10 MG tablet   Oral   Take 1 tablet (10 mg total) by mouth daily.   30 tablet   0   . montelukast (SINGULAIR) 10 MG tablet   Oral   Take 10 mg by mouth at bedtime.         Marland Kitchen omeprazole (PRILOSEC) 20 MG capsule   Oral   Take 1 capsule (20 mg total) by mouth daily.   180 capsule   1   . pravastatin (PRAVACHOL) 40 MG tablet   Oral   Take 1 tablet (40 mg total) by mouth daily.   90 tablet   1   . ranitidine (ZANTAC) 300 MG tablet   Oral   Take 1  tablet (300 mg total) by mouth at bedtime.   30 tablet   0   . sertraline (ZOLOFT) 50 MG tablet   Oral   Take 1 tablet (50 mg total) by mouth daily.   90 tablet   1   . triamcinolone cream (KENALOG) 0.1 %   Topical   Apply topically 2 (two) times daily.   80 g   0     SpO2 100%  Physical Exam  Nursing note and vitals reviewed. Constitutional: She appears well-developed and well-nourished.  HENT:  Head: Normocephalic and atraumatic.  Eyes: Conjunctivae are normal. Right eye exhibits no discharge. Left eye exhibits no discharge.  Neck: Normal range of motion. Neck supple.  Cardiovascular: Normal rate, regular rhythm and normal heart sounds.   Pulses:      Radial pulses are 2+ on the right side, and 2+ on the left side.  Pulmonary/Chest: Effort normal and breath sounds normal.  Abdominal: Soft. There is no tenderness.  Musculoskeletal:       Left shoulder: She exhibits decreased range of motion, tenderness, bony tenderness and deformity (suspect anterior dislocation).       Left elbow: Normal.       Left wrist: Normal.       Cervical back: Normal.       Left hand: Normal sensation noted. Normal strength noted.  Neurological: She is alert.  Skin: Skin is warm and dry.  Psychiatric: She has a normal mood and affect.    ED Course  Reduction of dislocation Date/Time: 05/19/2012 5:07 PM Performed by: Renne Crigler Authorized by: Renne Crigler Consent: written consent obtained. Risks and benefits: risks, benefits and alternatives were discussed Consent given by: patient Patient understanding: patient states understanding of the procedure being performed Patient consent: the patient's understanding of the procedure matches consent given Procedure consent: procedure consent matches procedure scheduled Relevant documents: relevant documents present and verified Imaging studies: imaging studies available Patient identity confirmed: verbally with patient and arm band Time  out: Immediately prior to procedure a "time out" was called to verify the correct patient, procedure, equipment, support staff and site/side marked as required. Patient sedated: yes Patient tolerance: Patient tolerated the procedure well with no immediate complications. Comments: Successful reduction. 2+ radial pulse after procedure.    (including critical care time)  Labs Reviewed - No data to display Dg Elbow 2 Views  Left  05/19/2012  *RADIOLOGY REPORT*  Clinical Data: Left elbow pain, shoulder injury  LEFT ELBOW - 2 VIEW  Comparison: None  Findings: Joint spaces preserved. Osseous mineralization grossly normal for technique. No definite fracture, dislocation or bone destruction. No elbow joint effusion.  IMPRESSION: No acute osseous abnormalities.   Original Report Authenticated By: Ulyses Southward, M.D.    Dg Shoulder Left  05/19/2012  *RADIOLOGY REPORT*  Clinical Data: Left shoulder injury.  Severe shoulder pain and decreased range of motion.  LEFT SHOULDER - 2+ VIEW  Comparison: None.  Findings: Anterior dislocation of the humeral head is seen in relation to the glenoid.  No acute fracture identified.  IMPRESSION: Anterior shoulder dislocation.  No fracture identified.   Original Report Authenticated By: Myles Rosenthal, M.D.    Dg Shoulder Left Port  05/19/2012  *RADIOLOGY REPORT*  Clinical Data: Post reduction of the left shoulder  PORTABLE LEFT SHOULDER - 2+ VIEW  Comparison: Left shoulder radiographs 05/19/2012 at 13:33 hours  Findings: The left humeral head projects over the glenoid fossa. Findings are consistent with interval reduction of the left shoulder.  No evidence of subluxation or dislocation. There is a vertically oriented sclerotic line along the lateral aspect of the humeral head, with an associated small wedge shaped defect. Findings are consistent with a Hill-Sachs lesion related to anterior dislocation.  Acromioclavicular joint is aligned.  IMPRESSION: The left shoulder is now located.  Hill-Sachs lesion of the humeral head is present.   Original Report Authenticated By: Britta Mccreedy, M.D.      1. Shoulder dislocation, left, initial encounter     1:57 PM Patient seen and examined. Work-up initiated. Medications ordered.   Vital signs reviewed and are as follows: Filed Vitals:   05/19/12 1700  BP: 138/83  Pulse: 113  Temp:   Resp: 21   5:04 PM Conscious sedation by Dr. Ranae Palms. Reduction of left shoulder performed by Dr. Ranae Palms myself.  Sling by ortho tech.   Patient had multiple episodes of vomiting prior to discharge. She was given Zofran and Reglan. Handoff to [employer] who will ensure patient tolerating PO's.  Re-exam: arm in sling, forearm soft, 2+ radial pulse.    MDM  Anterior shoulder dislocation, neurovascularly intact. No concerns for arterial injury or compartment syndrome. Reduction performed. Orthopedic followup given.        Renne Crigler, PA-C 05/19/12 2036

## 2012-05-19 NOTE — ED Provider Notes (Signed)
Pt received from [place], [place].  Pt received another dose of IV zofran.  She reports feeling better and is tolerating po fluids.  VSS.  D/c'd home w/ promethazine as well as referral to ortho and pain medications.     Otilio Miu, PA-C 05/20/12 1341

## 2012-05-20 ENCOUNTER — Telehealth (HOSPITAL_COMMUNITY): Payer: Self-pay | Admitting: Emergency Medicine

## 2012-05-20 NOTE — ED Provider Notes (Signed)
Medical screening examination/treatment/procedure(s) were conducted as a shared visit with non-physician practitioner(s) and myself.  I personally evaluated the patient during the encounter   Loren Racer, MD 05/20/12 1324

## 2012-05-22 ENCOUNTER — Ambulatory Visit: Payer: No Typology Code available for payment source | Admitting: Family Medicine

## 2012-05-22 ENCOUNTER — Telehealth: Payer: Self-pay | Admitting: Family Medicine

## 2012-05-22 ENCOUNTER — Encounter: Payer: Self-pay | Admitting: Family Medicine

## 2012-05-22 ENCOUNTER — Ambulatory Visit (INDEPENDENT_AMBULATORY_CARE_PROVIDER_SITE_OTHER): Payer: No Typology Code available for payment source | Admitting: Family Medicine

## 2012-05-22 VITALS — BP 135/89 | HR 83 | Temp 98.5°F | Ht 62.0 in | Wt 201.0 lb

## 2012-05-22 DIAGNOSIS — S43005A Unspecified dislocation of left shoulder joint, initial encounter: Secondary | ICD-10-CM

## 2012-05-22 MED ORDER — SERTRALINE HCL 50 MG PO TABS: 50.0000 mg | ORAL_TABLET | Freq: Every day | ORAL | Status: DC

## 2012-05-22 NOTE — Telephone Encounter (Signed)
Will fwd. To Dr.Matthews for letter and will fax it.  Deborah Knight, Deborah Knight

## 2012-05-22 NOTE — Patient Instructions (Addendum)
Continue to use your sling for now.   Follow up with me in one month They will call you to arrange physical therapy. The bony damage in your arm is called a hill-sachs lesion  Shoulder Dislocation A dislocated shoulder means that the bones of the shoulder joint are out of place. The shoulder needs to be put back in place. It is held in place with a sling or shoulder immobilizer. It usually takes 4 to 6 weeks for the shoulder joint to heal completely. Follow up with your caregiver as directed. Do not use your arm until your caregiver approves. You may remove your sling or shoulder immobilizer to bathe, unless otherwise directed by your caregiver. Limit any movement of your arm while the sling or shoulder immobilizer is removed. You are at an increased risk of additional dislocations, especially if you move your shoulder too much before it is healed. A shoulder that has been dislocated several times may require surgery to tighten up the joint in order to prevent dislocations in the future. Nerves around the joint can also be damaged with this injury. This is not common. Watch for signs of nerve damage.  SEEK IMMEDIATE MEDICAL CARE IF: You have numbness or weakness in the injured arm or hand. Document Released: 02/25/2005 Document Revised: 02/14/2011 Document Reviewed: 08/07/2009 [facility] Patient Information 2012 [place], [place].

## 2012-05-22 NOTE — Telephone Encounter (Signed)
Patient is calling because she forgot to ask for a note for work putting her on light duty due to her dislocated arm.  She would like it faxed to [phone] - Attention: Katie.  The patient would appreciate if this could be done today.

## 2012-05-23 NOTE — ED Provider Notes (Signed)
Medical screening examination/treatment/procedure(s) were performed by non-physician practitioner and as supervising physician I was immediately available for consultation/collaboration.  Anthony T Allen, MD 05/23/12 1644 

## 2012-05-24 NOTE — Progress Notes (Signed)
  Subjective:    Patient ID: Deborah Knight, female    DOB: 08-30-1965, 47 y.o.   MRN: [id number]  HPI  1. Shoulder pain:  Had fall 2 days ago with anterior dislocation of L shoulder.  Seen in ed with reduction and instructed to f/u with pcp.  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  Still with pain and spasm, using muscle relaxer and percocet that was prescribed in ED.  Has been wearing in sling.  Still with some swelling in upper arm.   2. Anxiety/Depression:  Picked up zoloft just last week.  Can tell some difference already she thinks.  Less anxiety she feels like although she is concerned about her job given her recent injury.  She denies any SI or HI.  Review of Systems Per HPI    Objective:   Physical Exam  Constitutional: She appears well-nourished. No distress.  Musculoskeletal:  L arm in sling.  Bruising present in upper arm.  Pulses strong. Hesitant to move arm due to pain.   Psychiatric: She has a normal mood and affect. Her behavior is normal.          Assessment & Plan:

## 2012-05-24 NOTE — Assessment & Plan Note (Signed)
Discussed continuing zoloft, will reassess in one month.

## 2012-05-24 NOTE — Assessment & Plan Note (Signed)
Still with significant pain.  Will refer her to PT for rehab.  Hill-sachs lesion does not appear to cover >20% of articular surface, therefore I don't know that ortho referral is necessary at this time.   Continue hydrocodone for pain control and robaxin for spasm.  Follow up in one month.  Discussed with Dr. Jennette Kettle

## 2012-06-02 ENCOUNTER — Ambulatory Visit: Payer: No Typology Code available for payment source | Attending: Family Medicine | Admitting: Physical Therapy

## 2012-06-02 ENCOUNTER — Telehealth: Payer: Self-pay | Admitting: Family Medicine

## 2012-06-02 DIAGNOSIS — R293 Abnormal posture: Secondary | ICD-10-CM | POA: Insufficient documentation

## 2012-06-02 DIAGNOSIS — S43006A Unspecified dislocation of unspecified shoulder joint, initial encounter: Secondary | ICD-10-CM | POA: Insufficient documentation

## 2012-06-02 DIAGNOSIS — W19XXXA Unspecified fall, initial encounter: Secondary | ICD-10-CM | POA: Insufficient documentation

## 2012-06-02 DIAGNOSIS — IMO0001 Reserved for inherently not codable concepts without codable children: Secondary | ICD-10-CM | POA: Insufficient documentation

## 2012-06-02 DIAGNOSIS — M21829 Other specified acquired deformities of unspecified upper arm: Secondary | ICD-10-CM

## 2012-06-02 DIAGNOSIS — M25619 Stiffness of unspecified shoulder, not elsewhere classified: Secondary | ICD-10-CM | POA: Insufficient documentation

## 2012-06-02 DIAGNOSIS — M25519 Pain in unspecified shoulder: Secondary | ICD-10-CM | POA: Insufficient documentation

## 2012-06-02 DIAGNOSIS — S43005S Unspecified dislocation of left shoulder joint, sequela: Secondary | ICD-10-CM

## 2012-06-02 NOTE — Telephone Encounter (Signed)
Pt is still in pain and has gone to PT but she is not sure if she should have gone back to work too soon.  Needs to know what to do.

## 2012-06-02 NOTE — Telephone Encounter (Signed)
Called pt back. She reports, that she has been at PT today and has upcoming PT 2 x weekly. Her shoulder hurts 9/10 after working. 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 She has no insurance, but applied for Medicaid. She wants to know what to do.  Will fwd. To PCP for review. Lorenda Hatchet, Renato Battles

## 2012-06-03 NOTE — Telephone Encounter (Signed)
Will place referral but may need to go to [facility] or [place] since she does not have insurance.

## 2012-06-03 NOTE — Telephone Encounter (Signed)
Patient is calling back to check the status of the ortho referral.

## 2012-06-04 ENCOUNTER — Ambulatory Visit: Payer: No Typology Code available for payment source | Admitting: Physical Therapy

## 2012-06-04 NOTE — Telephone Encounter (Signed)
Pt is asking for a note to be out of work for a week - she just can't do what she needs to do with her arm dislocated. - needs asap pls fax to [facility] - Attn: Florentina Addison - fax # [phone]

## 2012-06-04 NOTE — Telephone Encounter (Signed)
Called patient, she is willing to go to [facility] for referral. She is requesting a note to be out of work for the next 1-2 weeks, says her job is ok with this but needs a note. Forward to PCP for note.Melea Prezioso, Rodena Medin

## 2012-06-05 NOTE — Telephone Encounter (Signed)
Letter completed.

## 2012-06-08 ENCOUNTER — Encounter: Payer: Self-pay | Admitting: Family Medicine

## 2012-06-08 NOTE — Telephone Encounter (Signed)
Pt walked into clinic and complained about the letter (dates) Corrected. See under letter. Also given referral info to pt.  Deborah Knight, Deborah Knight

## 2012-06-09 ENCOUNTER — Ambulatory Visit: Payer: No Typology Code available for payment source | Attending: Family Medicine | Admitting: Physical Therapy

## 2012-06-09 DIAGNOSIS — IMO0001 Reserved for inherently not codable concepts without codable children: Secondary | ICD-10-CM | POA: Insufficient documentation

## 2012-06-09 DIAGNOSIS — M25619 Stiffness of unspecified shoulder, not elsewhere classified: Secondary | ICD-10-CM | POA: Insufficient documentation

## 2012-06-09 DIAGNOSIS — S43006A Unspecified dislocation of unspecified shoulder joint, initial encounter: Secondary | ICD-10-CM | POA: Insufficient documentation

## 2012-06-09 DIAGNOSIS — R293 Abnormal posture: Secondary | ICD-10-CM | POA: Insufficient documentation

## 2012-06-09 DIAGNOSIS — W19XXXA Unspecified fall, initial encounter: Secondary | ICD-10-CM | POA: Insufficient documentation

## 2012-06-09 DIAGNOSIS — M25519 Pain in unspecified shoulder: Secondary | ICD-10-CM | POA: Insufficient documentation

## 2012-06-11 ENCOUNTER — Ambulatory Visit: Payer: No Typology Code available for payment source | Admitting: Physical Therapy

## 2012-06-15 ENCOUNTER — Ambulatory Visit: Payer: No Typology Code available for payment source | Admitting: Physical Therapy

## 2012-06-17 ENCOUNTER — Ambulatory Visit: Payer: No Typology Code available for payment source | Admitting: Physical Therapy

## 2012-06-19 DIAGNOSIS — M25519 Pain in unspecified shoulder: Secondary | ICD-10-CM | POA: Insufficient documentation

## 2012-06-19 DIAGNOSIS — M25529 Pain in unspecified elbow: Secondary | ICD-10-CM | POA: Insufficient documentation

## 2012-06-22 ENCOUNTER — Ambulatory Visit: Payer: Self-pay | Admitting: Family Medicine

## 2012-06-23 ENCOUNTER — Ambulatory Visit: Payer: No Typology Code available for payment source | Admitting: Physical Therapy

## 2012-06-25 ENCOUNTER — Ambulatory Visit: Payer: No Typology Code available for payment source | Admitting: Physical Therapy

## 2012-06-29 ENCOUNTER — Ambulatory Visit: Payer: No Typology Code available for payment source | Admitting: Physical Therapy

## 2012-06-30 ENCOUNTER — Encounter: Payer: Self-pay | Admitting: Family Medicine

## 2012-06-30 ENCOUNTER — Encounter: Payer: Self-pay | Admitting: Physical Therapy

## 2012-06-30 ENCOUNTER — Ambulatory Visit (INDEPENDENT_AMBULATORY_CARE_PROVIDER_SITE_OTHER): Payer: No Typology Code available for payment source | Admitting: Family Medicine

## 2012-06-30 VITALS — BP 141/93 | HR 91 | Ht 62.0 in | Wt 206.0 lb

## 2012-06-30 DIAGNOSIS — F329 Major depressive disorder, single episode, unspecified: Secondary | ICD-10-CM

## 2012-06-30 DIAGNOSIS — S43005D Unspecified dislocation of left shoulder joint, subsequent encounter: Secondary | ICD-10-CM

## 2012-06-30 DIAGNOSIS — M545 Low back pain: Secondary | ICD-10-CM

## 2012-06-30 DIAGNOSIS — R220 Localized swelling, mass and lump, head: Secondary | ICD-10-CM

## 2012-06-30 DIAGNOSIS — Z5189 Encounter for other specified aftercare: Secondary | ICD-10-CM

## 2012-06-30 MED ORDER — TRAZODONE HCL 50 MG PO TABS: 50.0000 mg | ORAL_TABLET | Freq: Every evening | ORAL | Status: DC | PRN

## 2012-06-30 MED ORDER — HYDROCODONE-ACETAMINOPHEN 5-325 MG PO TABS: 1.0000 | ORAL_TABLET | Freq: Four times a day (QID) | ORAL | Status: DC | PRN

## 2012-06-30 MED ORDER — METHOCARBAMOL 500 MG PO TABS: 1000.0000 mg | ORAL_TABLET | Freq: Four times a day (QID) | ORAL | Status: DC | PRN

## 2012-06-30 MED ORDER — RANITIDINE HCL 300 MG PO TABS: 300.0000 mg | ORAL_TABLET | Freq: Every day | ORAL | Status: DC

## 2012-06-30 MED ORDER — IBUPROFEN 600 MG PO TABS: 600.0000 mg | ORAL_TABLET | Freq: Four times a day (QID) | ORAL | Status: DC | PRN

## 2012-06-30 NOTE — Progress Notes (Signed)
  Subjective:    Patient ID: Deborah Knight, female    DOB: Aug 20, 1965, 47 y.o.   MRN: [id number]  HPI 1. F/u anxiety/depression:  Anxiety and depression are fairly well controlled with sertraline.  Has cut her use of xanax down to 1 per day occasionally. Was using tid previously. Her only complaint is that she continues to have difficulty sleeping.  Has difficulty falling asleep and staying asleep.  Tried melatonin but has not been very helpful.    2. Shoulder pain:  S/p anterior dislocation x6 weeks ago.  Still with significant pain and instability.  Reports numbness into the hand at times.  She is being followed by Dr. Luiz Blare, orthopedics at [facility] and undergoing PT.  She is having and MRI arthrogram planned for tomorrow.  She requests a refill on her pain medication.     Review of Systems Per HPI    Objective:   Physical Exam  Constitutional: She appears well-nourished. No distress.  Musculoskeletal:  L shoulder in sling, kinesiotape across deltoid.  ROM limited due to pain.  Some spasm in deltoid and upper trapezius.  +apprehension  Psychiatric: She has a normal mood and affect. Her behavior is normal. Thought content normal.          Assessment & Plan:

## 2012-06-30 NOTE — Assessment & Plan Note (Signed)
Continued pain and instability.  Followed by ortho, MRI arthrogram tomorrow.  Continue PT and use of sling.  Hydrocodone and ibuprofen refilled

## 2012-06-30 NOTE — Assessment & Plan Note (Signed)
Well controlled with zoloft.  Continuing to cut back on benzo, hopeful to wean completely.  Add trazodone to help with sleep.  Follow up in one month to assess response.

## 2012-06-30 NOTE — Patient Instructions (Addendum)
Thank you for coming in today, it was good to see you Start the trazodone for sleep I will try to get records on your shoulder Follow up with me in one month

## 2012-07-02 ENCOUNTER — Ambulatory Visit: Payer: No Typology Code available for payment source | Admitting: Physical Therapy

## 2012-07-06 ENCOUNTER — Ambulatory Visit: Payer: No Typology Code available for payment source | Admitting: Physical Therapy

## 2012-07-07 ENCOUNTER — Ambulatory Visit: Payer: No Typology Code available for payment source | Admitting: Physical Therapy

## 2012-07-10 DIAGNOSIS — M249 Joint derangement, unspecified: Secondary | ICD-10-CM | POA: Insufficient documentation

## 2012-07-10 DIAGNOSIS — S43006A Unspecified dislocation of unspecified shoulder joint, initial encounter: Secondary | ICD-10-CM | POA: Insufficient documentation

## 2012-07-10 DIAGNOSIS — S43439A Superior glenoid labrum lesion of unspecified shoulder, initial encounter: Secondary | ICD-10-CM | POA: Insufficient documentation

## 2012-07-10 DIAGNOSIS — S42293A Other displaced fracture of upper end of unspecified humerus, initial encounter for closed fracture: Secondary | ICD-10-CM | POA: Insufficient documentation

## 2012-07-15 ENCOUNTER — Ambulatory Visit: Payer: No Typology Code available for payment source | Attending: Family Medicine | Admitting: Physical Therapy

## 2012-07-15 DIAGNOSIS — S43006A Unspecified dislocation of unspecified shoulder joint, initial encounter: Secondary | ICD-10-CM | POA: Insufficient documentation

## 2012-07-15 DIAGNOSIS — M25519 Pain in unspecified shoulder: Secondary | ICD-10-CM | POA: Insufficient documentation

## 2012-07-15 DIAGNOSIS — R293 Abnormal posture: Secondary | ICD-10-CM | POA: Insufficient documentation

## 2012-07-15 DIAGNOSIS — IMO0001 Reserved for inherently not codable concepts without codable children: Secondary | ICD-10-CM | POA: Insufficient documentation

## 2012-07-15 DIAGNOSIS — W19XXXA Unspecified fall, initial encounter: Secondary | ICD-10-CM | POA: Insufficient documentation

## 2012-07-15 DIAGNOSIS — M25619 Stiffness of unspecified shoulder, not elsewhere classified: Secondary | ICD-10-CM | POA: Insufficient documentation

## 2012-07-20 ENCOUNTER — Ambulatory Visit: Payer: No Typology Code available for payment source | Admitting: Physical Therapy

## 2012-07-22 ENCOUNTER — Ambulatory Visit: Payer: No Typology Code available for payment source | Admitting: Physical Therapy

## 2012-07-27 ENCOUNTER — Ambulatory Visit (INDEPENDENT_AMBULATORY_CARE_PROVIDER_SITE_OTHER): Payer: No Typology Code available for payment source | Admitting: Family Medicine

## 2012-07-27 ENCOUNTER — Encounter: Payer: Self-pay | Admitting: Family Medicine

## 2012-07-27 VITALS — BP 137/88 | HR 72 | Ht 62.0 in | Wt 203.5 lb

## 2012-07-27 DIAGNOSIS — F411 Generalized anxiety disorder: Secondary | ICD-10-CM

## 2012-07-27 DIAGNOSIS — I10 Essential (primary) hypertension: Secondary | ICD-10-CM

## 2012-07-27 DIAGNOSIS — S43005D Unspecified dislocation of left shoulder joint, subsequent encounter: Secondary | ICD-10-CM

## 2012-07-27 DIAGNOSIS — Z5189 Encounter for other specified aftercare: Secondary | ICD-10-CM

## 2012-07-27 NOTE — Patient Instructions (Addendum)
Thank you for coming in today, it was good to see you I am glad to see things are going well [place] job with weight loss! Follow up with me in one month so I can be sure you are still doing well.

## 2012-07-28 ENCOUNTER — Ambulatory Visit: Payer: No Typology Code available for payment source | Admitting: Physical Therapy

## 2012-07-28 NOTE — Assessment & Plan Note (Signed)
BP well controlled today, no changes to medications at this time.  Follow up in 6 months.

## 2012-07-28 NOTE — Assessment & Plan Note (Signed)
Has not required xanax in >1 month.  Tolerating sertaline very well, continue current treatment plan.

## 2012-07-28 NOTE — Progress Notes (Signed)
  Subjective:    Patient ID: Deborah Knight, female    DOB: 08/26/1965, 47 y.o.   MRN: [id number]  Hypertension    1. HTN:  CHRONIC HYPERTENSION  Disease Monitoring  Blood pressure range: No self monitoring at home  Chest pain: no   Dyspnea: no   Claudication: no   Medication compliance: yes  Medication Side Effects  Lightheadedness: No   Urinary frequency: no   Edema: no   Preventitive Healthcare:  Exercise: yes, walking and undergoing formal PT   Diet Pattern: Trying to improve by avoiding convenience foods and fried foods  Salt Restriction: No    2. Anxiety:  Doing well on sertraline.  Feels like her anxiety has been controlled well.  She has not used xanax since before her last appointment in April.    3. Shoulder pain: S/p shoulder dislocation ~2 months ago.  Has been evaluated by ortho at [facility], recently underwent MRI arthrogram.  She is planned to have surgery soon.  Continues to have PT 3x per week. She is using hydrocodone sparingly for pain control.   Past Medical History  Diagnosis Date  . Hypertension   . Anxiety   . Seasonal allergies   . HLD (hyperlipidemia)   . Asthma      Review of Systems PEr HPI    Objective:   Physical Exam  Constitutional:  Obese, nad   Cardiovascular: Normal rate and regular rhythm.   Musculoskeletal: She exhibits no edema.  L shoulder in sling.  Moderate pain with movement.           Assessment & Plan:

## 2012-07-28 NOTE — Assessment & Plan Note (Signed)
Followed by ortho at [facility], surgery planned soon.  COntinues with PT.

## 2012-07-30 ENCOUNTER — Ambulatory Visit: Payer: No Typology Code available for payment source | Admitting: Physical Therapy

## 2012-08-05 ENCOUNTER — Ambulatory Visit: Payer: No Typology Code available for payment source | Admitting: Physical Therapy

## 2012-08-10 ENCOUNTER — Ambulatory Visit: Payer: No Typology Code available for payment source | Attending: Family Medicine | Admitting: Physical Therapy

## 2012-08-10 ENCOUNTER — Telehealth: Payer: Self-pay | Admitting: Family Medicine

## 2012-08-10 ENCOUNTER — Encounter: Payer: Self-pay | Admitting: Physical Therapy

## 2012-08-10 DIAGNOSIS — M25519 Pain in unspecified shoulder: Secondary | ICD-10-CM | POA: Insufficient documentation

## 2012-08-10 DIAGNOSIS — S43006A Unspecified dislocation of unspecified shoulder joint, initial encounter: Secondary | ICD-10-CM | POA: Insufficient documentation

## 2012-08-10 DIAGNOSIS — R293 Abnormal posture: Secondary | ICD-10-CM | POA: Insufficient documentation

## 2012-08-10 DIAGNOSIS — IMO0001 Reserved for inherently not codable concepts without codable children: Secondary | ICD-10-CM | POA: Insufficient documentation

## 2012-08-10 DIAGNOSIS — M25619 Stiffness of unspecified shoulder, not elsewhere classified: Secondary | ICD-10-CM | POA: Insufficient documentation

## 2012-08-10 DIAGNOSIS — W19XXXA Unspecified fall, initial encounter: Secondary | ICD-10-CM | POA: Insufficient documentation

## 2012-08-10 NOTE — Telephone Encounter (Signed)
Pt has been taking vicodan. She says the medicine is not working. Wants percoset. Please advise

## 2012-08-10 NOTE — Telephone Encounter (Signed)
Will FWD to MD.  Joyce Heitman L, CMA  

## 2012-08-11 NOTE — Telephone Encounter (Signed)
Will need follow up to discuss change in pain medication.

## 2012-08-12 ENCOUNTER — Ambulatory Visit: Payer: No Typology Code available for payment source | Admitting: Physical Therapy

## 2012-08-12 NOTE — Telephone Encounter (Signed)
Pt notified.  Has appt 08/28/2012 with Dr. Ashley Royalty.  Pt states her pain is getting worse and would like an earlier appt.  Advised pt to call front office and see if earlier appt available with Dr. Ashley Royalty.  Cardin Nitschke, Darlyne Russian, CMA

## 2012-08-13 ENCOUNTER — Encounter (HOSPITAL_COMMUNITY): Payer: Self-pay | Admitting: Emergency Medicine

## 2012-08-13 ENCOUNTER — Telehealth: Payer: Self-pay | Admitting: *Deleted

## 2012-08-13 ENCOUNTER — Encounter: Payer: Self-pay | Admitting: *Deleted

## 2012-08-13 ENCOUNTER — Emergency Department (INDEPENDENT_AMBULATORY_CARE_PROVIDER_SITE_OTHER)
Admission: EM | Admit: 2012-08-13 | Discharge: 2012-08-13 | Disposition: A | Discharge status: Home / Self Care | Payer: No Typology Code available for payment source | Source: Home / Self Care | Attending: Emergency Medicine | Admitting: Emergency Medicine

## 2012-08-13 DIAGNOSIS — G8911 Acute pain due to trauma: Secondary | ICD-10-CM

## 2012-08-13 DIAGNOSIS — M25519 Pain in unspecified shoulder: Secondary | ICD-10-CM

## 2012-08-13 MED ORDER — KETOROLAC TROMETHAMINE 60 MG/2ML IM SOLN
60.0000 mg | Freq: Once | INTRAMUSCULAR | Status: AC
  Administered 2012-08-13: 60 mg via INTRAMUSCULAR

## 2012-08-13 MED ORDER — KETOROLAC TROMETHAMINE 60 MG/2ML IM SOLN
INTRAMUSCULAR | Status: AC
  Filled 2012-08-13: qty 2

## 2012-08-13 MED ORDER — OXYCODONE-ACETAMINOPHEN 5-325 MG PO TABS: 2.0000 | ORAL_TABLET | ORAL | Status: DC | PRN

## 2012-08-13 NOTE — ED Provider Notes (Signed)
History     CSN: [id number]  Arrival date & time 08/13/12  1035   First MD Initiated Contact with Patient 08/13/12 1150      Chief Complaint  Patient presents with  . Shoulder Pain    (Consider location/radiation/quality/duration/timing/severity/associated sxs/prior treatment) HPI Comments: Patient presents urgent care this afternoon requesting pain medicine. She describes she's undergoing physical therapy and that seemed to have exacerbated her pain on her left shoulder. Patient has had shoulder problems result of previous anterior dislocations. She has been discussing pain management with her primary care doctor. They were unable to see her today patient was instructed to come here. No further changes from her baseline location of her shoulder pain and no new lead develop injury or falls.  Patient is a 47 y.o. female presenting with shoulder pain. The history is provided by the patient.  Shoulder Pain This is a chronic problem. The problem occurs constantly. The problem has been gradually worsening. Pertinent negatives include no chest pain, no abdominal pain and no shortness of breath. Exacerbated by: movemnt. Nothing relieves the symptoms. The treatment provided no relief.    Past Medical History  Diagnosis Date  . Hypertension   . Anxiety   . Seasonal allergies   . HLD (hyperlipidemia)   . Asthma     Past Surgical History  Procedure Laterality Date  . Cesarean section  1997  . Hernia repair  1989  . Tubal ligation  1997    Family History  Problem Relation Age of Onset  . Asthma Father   . Asthma Sister   . Asthma Brother   . Asthma Maternal Grandmother   . Asthma Maternal Grandfather   . Asthma Paternal Grandmother   . Asthma Paternal Grandfather   . Cancer Father   . Hyperlipidemia Mother   . Hyperlipidemia Sister     History  Substance Use Topics  . Smoking status: Current Some Day Smoker    Types: Cigars  . Smokeless tobacco: Not on file  . Alcohol Use:  No    OB History   Grav Para Term Preterm Abortions TAB SAB Ect Mult Living                  Review of Systems  Constitutional: Positive for activity change.  Respiratory: Negative for shortness of breath.   Cardiovascular: Negative for chest pain.  Gastrointestinal: Negative for abdominal pain.  Musculoskeletal: Negative for myalgias and back pain.  Skin: Negative for color change.    Allergies  Losartan  Home Medications   Current Outpatient Rx  Name  Route  Sig  Dispense  Refill  . albuterol (PROVENTIL HFA;VENTOLIN HFA) 108 (90 BASE) MCG/ACT inhaler   Inhalation   Inhale 2 puffs into the lungs every 6 (six) hours as needed. Shortness of breath         . ALPRAZolam (XANAX) 0.5 MG tablet   Oral   Take 0.5 mg by mouth 3 (three) times daily as needed. Anxiety         . amLODipine (NORVASC) 10 MG tablet   Oral   Take 1 tablet (10 mg total) by mouth daily.   30 tablet   6   . fluticasone-salmeterol (ADVAIR HFA) 115-21 MCG/ACT inhaler   Inhalation   Inhale 2 puffs into the lungs 2 (two) times daily.         Marland Kitchen HYDROcodone-acetaminophen (NORCO/VICODIN) 5-325 MG per tablet   Oral   Take 1 tablet by mouth every 6 (six) hours  as needed. pain   30 tablet   2   . hydrOXYzine (ATARAX/VISTARIL) 25 MG tablet   Oral   Take 1 tablet (25 mg total) by mouth at bedtime as needed for itching.   30 tablet   0   . ibuprofen (ADVIL,MOTRIN) 600 MG tablet   Oral   Take 1 tablet (600 mg total) by mouth every 6 (six) hours as needed for pain.   30 tablet   1   . loratadine (CLARITIN) 10 MG tablet   Oral   Take 1 tablet (10 mg total) by mouth daily.   30 tablet   0   . methocarbamol (ROBAXIN) 500 MG tablet   Oral   Take 2 tablets (1,000 mg total) by mouth 4 (four) times daily as needed (muscle spasm).   30 tablet   0   . montelukast (SINGULAIR) 10 MG tablet   Oral   Take 10 mg by mouth at bedtime.         . pravastatin (PRAVACHOL) 40 MG tablet   Oral   Take  1 tablet (40 mg total) by mouth daily.   90 tablet   1   . ranitidine (ZANTAC) 300 MG tablet   Oral   Take 1 tablet (300 mg total) by mouth at bedtime.   30 tablet   6   . sertraline (ZOLOFT) 50 MG tablet   Oral   Take 1 tablet (50 mg total) by mouth daily.   90 tablet   1   . traZODone (DESYREL) 50 MG tablet   Oral   Take 1 tablet (50 mg total) by mouth at bedtime as needed for sleep.   30 tablet   3   . triamcinolone cream (KENALOG) 0.1 %   Topical   Apply topically 2 (two) times daily.   80 g   0     BP 137/90  Pulse 74  Temp(Src) 98.4 F (36.9 C) (Oral)  Resp 18  SpO2 99%  LMP 08/13/2012  Physical Exam  Nursing note and vitals reviewed. Constitutional: Vital signs are normal. She appears well-developed.  Non-toxic appearance. She does not have a sickly appearance. She does not appear ill. She appears distressed.    Musculoskeletal:       Arms: Neurological: She is alert.  Skin: No erythema.    ED Course  Procedures (including critical care time)  Labs Reviewed - No data to display No results found.   No diagnosis found.    MDM  Have discussed with patient necessity to discuss her pain management further with her primary care doctor. Will provide patient with a Toradol intramuscular injection and have discussed with her provided her with 4 tablets of Percocet and tissue discuss this further with her Dr. I have in anticipation discussed with Mrs. Derrell Lolling is by no means this is the only pharmacological means to treat her ongoing/chronic pain. She agrees and will followup with her primary care Dr. at family practice.       Jimmie Molly, MD 08/13/12 1233

## 2012-08-13 NOTE — Telephone Encounter (Signed)
Pt in today due to confusion with appointment time ( thought it was for today but appointment is tomorrow). Pt tearfully verbalizes that pain meds are not working and needs something stronger and that she is not sleeping well at night. Explained that there were no further appointments for today - unable to come tomorrow. Advised to go to UC and wear sling properly to ( helped adjust for pt) to see if pain would decrease - note given for work - Pt states that she will leave and go to UC for further relief.  Wyatt Haste, RN-BSN

## 2012-08-13 NOTE — ED Notes (Signed)
Applied ice pack to left shoulder.

## 2012-08-13 NOTE — ED Notes (Signed)
Requested work note.  Discussed with dr Ladon Applebaum

## 2012-08-13 NOTE — ED Notes (Addendum)
Patient requesting pain medicine .  Reports current medicine is not helping.  Went to her doctors office this am, but confusion over appt and patient told to come to [facility].  Patient currently in physical therapy.  Originally fell at work per patient.

## 2012-08-14 ENCOUNTER — Encounter: Payer: Self-pay | Admitting: Family Medicine

## 2012-08-14 ENCOUNTER — Ambulatory Visit: Payer: Self-pay | Admitting: Family Medicine

## 2012-08-14 ENCOUNTER — Ambulatory Visit (INDEPENDENT_AMBULATORY_CARE_PROVIDER_SITE_OTHER): Payer: No Typology Code available for payment source | Admitting: Family Medicine

## 2012-08-14 VITALS — BP 144/92 | HR 76 | Temp 98.1°F | Ht 62.0 in | Wt 202.0 lb

## 2012-08-14 DIAGNOSIS — M25519 Pain in unspecified shoulder: Secondary | ICD-10-CM

## 2012-08-14 DIAGNOSIS — Z5189 Encounter for other specified aftercare: Secondary | ICD-10-CM

## 2012-08-14 DIAGNOSIS — M25512 Pain in left shoulder: Secondary | ICD-10-CM

## 2012-08-14 DIAGNOSIS — S43005D Unspecified dislocation of left shoulder joint, subsequent encounter: Secondary | ICD-10-CM

## 2012-08-14 MED ORDER — OXYCODONE-ACETAMINOPHEN 5-325 MG PO TABS: 2.0000 | ORAL_TABLET | Freq: Two times a day (BID) | ORAL | Status: DC | PRN

## 2012-08-14 NOTE — Progress Notes (Signed)
  Subjective:    Patient ID: Deborah Knight, female    DOB: 1965-06-03, 47 y.o.   MRN: [id number]  HPI # SDA. She would like refills on pain medications.  Left shoulder pain (she said she dislocated and tore a shoulder muscle). She fell at work 05/19/2012, and she is pending surgery, hopefully on 06/26.   She is currently undergoing physical therapy.  She went to UC yesterday and given 6 tablets Percocet and advised to come to [place] for more pain medications.  Vicodin was not helping as much; she reports lasting about an hour and she was waking up all night.  Percocet lasts all night long. She took 2 tablet last night.  Flexeril has not been helping.   Orthopedist not prescribing pain medications until after surgery; Dr. Luiz Blare at [facility].   Review of Systems Denies fevers/chills, worsening shoulder pain   Allergies, medication, past medical history reviewed.  Smoking status noted.     Objective:   Physical Exam GEN: NAD; well-nourished, -appearing PSYCH: pleasant PULM: NI WOB LEFT SHOULDER: moderate tenderness anterior shoulder to palpation; no skin erythema or other rash; 2+ radial pulses: SKIN: warm, dry, intact, no obvious bruising/swelling    Assessment & Plan:

## 2012-08-14 NOTE — Assessment & Plan Note (Addendum)
She was seen at [facility] due to persistent pain. She would like Vicodin changed to Percocet, which seems to provide significant relief.  -UDS today -OK for Rx for Percocet 5/325, 2 tablets bid, #100 to last until her surgery on 09/03/2012.  -She was given Rx 04/22 for Vicodin, 2 RF. Called HT to cancel refills on Vicodin; she had 1 refill left after getting filled end of May.

## 2012-08-15 LAB — DRUG SCREEN, URINE
Barbiturate Quant, Ur: NEGATIVE
Creatinine,U: 295.47 mg/dL
Opiates: NEGATIVE
Propoxyphene: NEGATIVE

## 2012-08-18 ENCOUNTER — Ambulatory Visit: Payer: No Typology Code available for payment source

## 2012-08-24 ENCOUNTER — Ambulatory Visit: Payer: No Typology Code available for payment source | Admitting: Physical Therapy

## 2012-08-26 ENCOUNTER — Ambulatory Visit: Payer: No Typology Code available for payment source | Admitting: Physical Therapy

## 2012-08-27 ENCOUNTER — Encounter: Payer: Self-pay | Admitting: Physical Therapy

## 2012-08-28 ENCOUNTER — Encounter: Payer: Self-pay | Admitting: Family Medicine

## 2012-08-28 ENCOUNTER — Ambulatory Visit: Payer: Self-pay | Admitting: Family Medicine

## 2012-08-28 ENCOUNTER — Ambulatory Visit (INDEPENDENT_AMBULATORY_CARE_PROVIDER_SITE_OTHER): Payer: No Typology Code available for payment source | Admitting: Family Medicine

## 2012-08-28 VITALS — BP 135/90 | HR 81 | Ht 62.0 in | Wt 204.0 lb

## 2012-08-28 DIAGNOSIS — F17200 Nicotine dependence, unspecified, uncomplicated: Secondary | ICD-10-CM

## 2012-08-28 DIAGNOSIS — Z720 Tobacco use: Secondary | ICD-10-CM

## 2012-08-28 DIAGNOSIS — S43005D Unspecified dislocation of left shoulder joint, subsequent encounter: Secondary | ICD-10-CM

## 2012-08-28 DIAGNOSIS — Z5189 Encounter for other specified aftercare: Secondary | ICD-10-CM

## 2012-08-28 MED ORDER — ALBUTEROL SULFATE HFA 108 (90 BASE) MCG/ACT IN AERS: 2.0000 | INHALATION_SPRAY | Freq: Four times a day (QID) | RESPIRATORY_TRACT | Status: DC | PRN

## 2012-08-28 MED ORDER — MONTELUKAST SODIUM 10 MG PO TABS: 10.0000 mg | ORAL_TABLET | Freq: Every day | ORAL | Status: DC

## 2012-08-28 NOTE — Patient Instructions (Addendum)
Thank you for coming in today, it was good to see you I am glad you are sticking with PT Stop smoking completely

## 2012-08-30 NOTE — Assessment & Plan Note (Signed)
Pain well controlled, has follow up with ortho on 6/26 to discuss surgery.  She will continue physical therapy until then.

## 2012-08-30 NOTE — Progress Notes (Signed)
  Subjective:    Patient ID: Deborah Knight, female    DOB: [birth year], 47 y.o.   MRN: [id number]  HPI  Here for f/u of shoulder dislocation.  Was seen by Dr. Madolyn Frieze since the last time I saw her, hydrocodone switched to percocet.  Pain is well controlled currently and she is abel to fully participate with her physical therapy sessions.  She has follow up with orthopedics on 6/26.  She does report that during a previous visit she had lip swelling after the toradol injection she received. Denies swelling, fevers, increased pain.   Smoking history reviewed, has cut back to 1 cigarette per day.   Review of Systems Per HPI    Objective:   Physical Exam  Constitutional:  Obese, nad   Musculoskeletal:  L shoulder in sling.  Spasm in upper trapezius and deltoid.  Pain with ROM testing.  Strength 4/5.            Assessment & Plan:

## 2012-08-30 NOTE — Assessment & Plan Note (Signed)
Discussed smoking cessation, she would like to quit.

## 2012-09-01 ENCOUNTER — Ambulatory Visit: Payer: No Typology Code available for payment source | Admitting: Physical Therapy

## 2012-09-02 ENCOUNTER — Ambulatory Visit: Payer: No Typology Code available for payment source | Admitting: Physical Therapy

## 2012-09-03 ENCOUNTER — Ambulatory Visit: Payer: No Typology Code available for payment source | Admitting: Physical Therapy

## 2012-09-04 DIAGNOSIS — M67929 Unspecified disorder of synovium and tendon, unspecified upper arm: Secondary | ICD-10-CM | POA: Insufficient documentation

## 2012-09-04 DIAGNOSIS — M7520 Bicipital tendinitis, unspecified shoulder: Secondary | ICD-10-CM | POA: Insufficient documentation

## 2012-09-04 DIAGNOSIS — J45909 Unspecified asthma, uncomplicated: Secondary | ICD-10-CM | POA: Insufficient documentation

## 2012-09-06 ENCOUNTER — Encounter: Payer: Self-pay | Admitting: Family Medicine

## 2012-09-06 ENCOUNTER — Other Ambulatory Visit: Payer: Self-pay | Admitting: Family Medicine

## 2012-09-07 ENCOUNTER — Ambulatory Visit: Payer: No Typology Code available for payment source | Admitting: Physical Therapy

## 2012-09-07 ENCOUNTER — Encounter: Payer: Self-pay | Admitting: Physical Therapy

## 2012-09-09 ENCOUNTER — Ambulatory Visit: Payer: No Typology Code available for payment source | Attending: Family Medicine | Admitting: Physical Therapy

## 2012-09-09 DIAGNOSIS — IMO0001 Reserved for inherently not codable concepts without codable children: Secondary | ICD-10-CM | POA: Insufficient documentation

## 2012-09-09 DIAGNOSIS — W19XXXA Unspecified fall, initial encounter: Secondary | ICD-10-CM | POA: Insufficient documentation

## 2012-09-09 DIAGNOSIS — S43006A Unspecified dislocation of unspecified shoulder joint, initial encounter: Secondary | ICD-10-CM | POA: Insufficient documentation

## 2012-09-09 DIAGNOSIS — M25519 Pain in unspecified shoulder: Secondary | ICD-10-CM | POA: Insufficient documentation

## 2012-09-09 DIAGNOSIS — R293 Abnormal posture: Secondary | ICD-10-CM | POA: Insufficient documentation

## 2012-09-09 DIAGNOSIS — M25619 Stiffness of unspecified shoulder, not elsewhere classified: Secondary | ICD-10-CM | POA: Insufficient documentation

## 2012-09-10 ENCOUNTER — Encounter: Payer: Self-pay | Admitting: Physical Therapy

## 2012-09-15 ENCOUNTER — Ambulatory Visit: Payer: No Typology Code available for payment source | Attending: Family Medicine | Admitting: Physical Therapy

## 2012-09-17 ENCOUNTER — Ambulatory Visit: Payer: No Typology Code available for payment source | Admitting: Physical Therapy

## 2012-09-21 ENCOUNTER — Ambulatory Visit: Payer: No Typology Code available for payment source | Admitting: Physical Therapy

## 2012-09-23 ENCOUNTER — Encounter: Payer: Self-pay | Admitting: Physical Therapy

## 2012-09-24 ENCOUNTER — Ambulatory Visit: Payer: No Typology Code available for payment source | Admitting: Physical Therapy

## 2012-09-25 HISTORY — PX: SHOULDER ARTHROSCOPY WITH BICEPS TENDON REPAIR: SHX5674

## 2012-09-28 ENCOUNTER — Encounter: Payer: Self-pay | Admitting: Family Medicine

## 2012-09-28 ENCOUNTER — Ambulatory Visit (INDEPENDENT_AMBULATORY_CARE_PROVIDER_SITE_OTHER): Payer: No Typology Code available for payment source | Admitting: Family Medicine

## 2012-09-28 VITALS — BP 145/93 | HR 82 | Ht 62.0 in | Wt 212.0 lb

## 2012-09-28 DIAGNOSIS — J45909 Unspecified asthma, uncomplicated: Secondary | ICD-10-CM

## 2012-09-28 DIAGNOSIS — I10 Essential (primary) hypertension: Secondary | ICD-10-CM

## 2012-09-28 DIAGNOSIS — M7522 Bicipital tendinitis, left shoulder: Secondary | ICD-10-CM

## 2012-09-28 DIAGNOSIS — M7520 Bicipital tendinitis, unspecified shoulder: Secondary | ICD-10-CM

## 2012-09-28 MED ORDER — AMLODIPINE BESYLATE 10 MG PO TABS: 10.0000 mg | ORAL_TABLET | Freq: Every day | ORAL | Status: DC

## 2012-09-28 MED ORDER — FLUTICASONE-SALMETEROL 115-21 MCG/ACT IN AERO: 2.0000 | INHALATION_SPRAY | Freq: Two times a day (BID) | RESPIRATORY_TRACT | Status: DC

## 2012-09-28 NOTE — Patient Instructions (Addendum)
Thanks for coming in today. It was good to meet you!  Today we discussed your recent shoulder surgery and medication refills.  Your surgery site on your Left shoulder looks good today! Follow the instructions from Dr. Luiz Blare, continue taking your oxycodone pain medication as prescribed, and if you feel like you don't need it as much in the next few weeks, it is fine to taper it down. You may shower, but don't soak your shoulder, blot gently to dry. MOST IMPORTANTLY - continue to remove the sling several times a day and do gentle range of motion exercises with your hand, wrist, and elbow! This is very important to prevent a frozen shoulder.  I refilled your Amlodipine for another 6 months.  The options for Advair are to continue with HT at $51, or you might be able to qualify for MAP (medication assistance program) at the [facility] for your Advair. You would need to go there and complete paperwork, and it may take up to a month, but you could get a significant discount or for free.  Some important numbers from today's visit: BP - 145/93  Please schedule a follow-up appointment with 3 months, or sooner if needed.  If you have any other questions or concerns, please feel free to call the clinic to contact me. You may also schedule another appointment if necessary.  However, if your symptoms get significantly worse, please go to the Emergency Department to seek immediate medical attention.  Saralyn Pilar, DO [facility]

## 2012-09-28 NOTE — Assessment & Plan Note (Addendum)
BP elevated today 145/93. Past several measurements all similarly elevated. Likely due to severe pain of Left Shoulder injury, now s/p surgery. Current regimen - Amlodipine 10mg  daily (monotherapy)   Continue current med. Will continue to monitor BP, if pain improves and BP remains elevated will consider adding a 2nd med, Thiazide, due to reported allergy to ARB, and non-DM pt.

## 2012-09-28 NOTE — Assessment & Plan Note (Addendum)
Background - Previous hx of traumatic fall 05/19/12 with significant injury to Left Shoulder, see prior notes for full details. 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 Luiz Blare at [facility], and Arthroscopic L-shoulder surgery to repair biceps tendon and torn labrum performed 09/25/12.  Today 4 days s/p Left-Shoulder arthroscopic surgery. Removed bandage, steri-strips in place. Incision sites look clean, no significant erythema / edema / warmth appreciated, no pus, drainage, or excessive bleeding. No fever. Some minor irritation reaction to adhesive.  Current management: - Oxycodone 5mg  IR may take 1-3 pills q 4 hr PRN pain, has consistently needed 3 pills every 4 hours to control pain. No longer taking pre-op Rx Percocet or Vicodin. - Wearing cushioned sling (will wear for 4-6 weeks), compliant with instructions to remove arm 4-5x daily and work on ROM exercises hand, wrist, elbow. - Per post-op instructions, patient able to shower, but not soak shoulder. Discussed this, and patient understands. - Not scheduled and no specific recs for PT post-op. But patient reports surgeon did discuss PT, and will follow-up.  - Scheduled for follow-up with [facility] 10/08/12.

## 2012-09-28 NOTE — Progress Notes (Signed)
Subjective:     Patient ID: DIM MEISINGER, female   DOB: 04-Nov-1965, 47 y.o.   MRN: [id number]  HPI  BICIPITAL TENOSYNOVITIS - LEFT SHOULDER (post-op) Background - Previous hx of traumatic fall 05/19/12 with significant injury to Left Shoulder. Treated with pain management and PT, evaluated by Dr. Luiz Blare at [facility], and Arthroscopic L-shoulder surgery to repair biceps tendon and torn labrum performed 09/25/12. ([employer], and documents from her surgery).  Patient presents 4 days s/p Left-Shoulder arthroscopic surgery, L-arm in cushioned sling with large adhesive bandage on shoulder, steri-strips in place. She is following post-op orders from Ortho to remove sling several times a day and work on ROM exercises for hand, wrist, and elbow to prevent frozen shoulder, which she had pre-op. Reports severe pain in L-shoulder, worsened by movements and laying down unsupported.  Treatment - Oxycodone 5mg  IR, 1-3 pills q 4 hr PRN pain, has needed 3 q 4hr to control pain Assoc sx - complain of itching around bandage site. Denies fever, swelling, pus, bleeding or drainage from surg sites.   HYPERTENSION Reports good compliance with her Amlodipine 10mg  daily BP med. Denies chest pain, HA, dizziness, syncope, edema. Does not check BP at home.  BP Readings from Last 3 Encounters:  09/28/12 145/93  08/28/12 135/90  08/14/12 144/92   ASTHMA Requested refill of Advair inhaler, also inquired about refills on her Albuterol. Reports that her asthma needed to be under control prior to surgery. No respiratory complaints today. No wheezing, shortness of breath.  Social Hx - Quit smoking prior to surgery - 09/24/2012. Continued to be smoke-free today.   Review of Systems  See above HPI. All other systems negative.     Objective:   Physical Exam  BP 145/93  Pulse 82  Ht 5\' 2"  (1.575 m)  Wt 212 lb (96.163 kg)  BMI 38.77 kg/m2  General - pleasant, obese, left arm in cushioned  sling with large tape bandage, NAD HEENT - PERRLA, pharynx clear Neck - supple, non-tender, no LAD Heart - Regular rate and rhythm.  No murmurs, gallops or rubs.    Lung - CTAB, no wheezes, rhonchi, rales Abd - soft, non-tender, no masses, +BS Ext - +2 peripheral pulses, warm, no edema Skin - Left-shoulder: removed bandage and gauze, steri-strips intact over arthroscopic sites. Incisions look clean, without pus, drainage, or excessive bleeding. No erythema, edema, warmth or signs of infection. No hematoma. Some minor allergic reaction to adhesive tape. Neuro - intact sensation to light touch distally Psych - alert, oriented

## 2012-09-28 NOTE — Assessment & Plan Note (Signed)
Refilled previously Advair HFA for 1 year. Printed med and gave to pt to take to [facility] to apply for MAP, due to high cost at [employer].

## 2012-10-05 ENCOUNTER — Ambulatory Visit: Payer: Self-pay | Admitting: Family Medicine

## 2012-10-09 DIAGNOSIS — S43432A Superior glenoid labrum lesion of left shoulder, initial encounter: Secondary | ICD-10-CM | POA: Insufficient documentation

## 2012-10-09 DIAGNOSIS — IMO0002 Reserved for concepts with insufficient information to code with codable children: Secondary | ICD-10-CM | POA: Insufficient documentation

## 2012-10-20 ENCOUNTER — Ambulatory Visit: Payer: No Typology Code available for payment source | Admitting: Physical Therapy

## 2012-10-22 ENCOUNTER — Encounter: Payer: Self-pay | Admitting: Physical Therapy

## 2012-10-26 ENCOUNTER — Encounter (HOSPITAL_COMMUNITY): Payer: Self-pay | Admitting: *Deleted

## 2012-10-26 ENCOUNTER — Ambulatory Visit (INDEPENDENT_AMBULATORY_CARE_PROVIDER_SITE_OTHER): Payer: No Typology Code available for payment source | Admitting: Family Medicine

## 2012-10-26 ENCOUNTER — Ambulatory Visit: Payer: No Typology Code available for payment source | Attending: Family Medicine | Admitting: Physical Therapy

## 2012-10-26 ENCOUNTER — Telehealth: Payer: Self-pay | Admitting: *Deleted

## 2012-10-26 ENCOUNTER — Emergency Department (HOSPITAL_COMMUNITY)
Admission: EM | Admit: 2012-10-26 | Discharge: 2012-10-26 | Disposition: A | Discharge status: Home / Self Care | Payer: No Typology Code available for payment source | Attending: Emergency Medicine | Admitting: Emergency Medicine

## 2012-10-26 ENCOUNTER — Encounter: Payer: Self-pay | Admitting: Family Medicine

## 2012-10-26 VITALS — BP 133/79 | HR 80 | Temp 98.5°F | Wt 215.0 lb

## 2012-10-26 DIAGNOSIS — Z8709 Personal history of other diseases of the respiratory system: Secondary | ICD-10-CM | POA: Insufficient documentation

## 2012-10-26 DIAGNOSIS — I10 Essential (primary) hypertension: Secondary | ICD-10-CM

## 2012-10-26 DIAGNOSIS — F411 Generalized anxiety disorder: Secondary | ICD-10-CM | POA: Insufficient documentation

## 2012-10-26 DIAGNOSIS — E785 Hyperlipidemia, unspecified: Secondary | ICD-10-CM | POA: Insufficient documentation

## 2012-10-26 DIAGNOSIS — T7840XD Allergy, unspecified, subsequent encounter: Secondary | ICD-10-CM

## 2012-10-26 DIAGNOSIS — M25619 Stiffness of unspecified shoulder, not elsewhere classified: Secondary | ICD-10-CM | POA: Insufficient documentation

## 2012-10-26 DIAGNOSIS — IMO0001 Reserved for inherently not codable concepts without codable children: Secondary | ICD-10-CM | POA: Insufficient documentation

## 2012-10-26 DIAGNOSIS — Z79899 Other long term (current) drug therapy: Secondary | ICD-10-CM | POA: Insufficient documentation

## 2012-10-26 DIAGNOSIS — M25519 Pain in unspecified shoulder: Secondary | ICD-10-CM | POA: Insufficient documentation

## 2012-10-26 DIAGNOSIS — H5789 Other specified disorders of eye and adnexa: Secondary | ICD-10-CM | POA: Insufficient documentation

## 2012-10-26 DIAGNOSIS — W19XXXA Unspecified fall, initial encounter: Secondary | ICD-10-CM | POA: Insufficient documentation

## 2012-10-26 DIAGNOSIS — Z91040 Latex allergy status: Secondary | ICD-10-CM | POA: Insufficient documentation

## 2012-10-26 DIAGNOSIS — R293 Abnormal posture: Secondary | ICD-10-CM | POA: Insufficient documentation

## 2012-10-26 DIAGNOSIS — L509 Urticaria, unspecified: Secondary | ICD-10-CM | POA: Insufficient documentation

## 2012-10-26 DIAGNOSIS — S43006A Unspecified dislocation of unspecified shoulder joint, initial encounter: Secondary | ICD-10-CM | POA: Insufficient documentation

## 2012-10-26 DIAGNOSIS — R220 Localized swelling, mass and lump, head: Secondary | ICD-10-CM | POA: Insufficient documentation

## 2012-10-26 DIAGNOSIS — Z5189 Encounter for other specified aftercare: Secondary | ICD-10-CM

## 2012-10-26 DIAGNOSIS — F17200 Nicotine dependence, unspecified, uncomplicated: Secondary | ICD-10-CM | POA: Insufficient documentation

## 2012-10-26 DIAGNOSIS — J029 Acute pharyngitis, unspecified: Secondary | ICD-10-CM | POA: Insufficient documentation

## 2012-10-26 DIAGNOSIS — J45909 Unspecified asthma, uncomplicated: Secondary | ICD-10-CM | POA: Insufficient documentation

## 2012-10-26 MED ORDER — HYDROCHLOROTHIAZIDE 12.5 MG PO TABS: 25.0000 mg | ORAL_TABLET | Freq: Every day | ORAL | Status: DC

## 2012-10-26 MED ORDER — FLUTICASONE PROPIONATE 50 MCG/ACT NA SUSP: 2.0000 | Freq: Every day | NASAL | Status: DC

## 2012-10-26 MED ORDER — AMLODIPINE BESYLATE 10 MG PO TABS: 10.0000 mg | ORAL_TABLET | Freq: Every day | ORAL | Status: DC

## 2012-10-26 NOTE — Telephone Encounter (Signed)
Pt reports facial swelling after laying down - this has happened several times - takes htn med in the morning - states that BP has been high and throat has been scratching. Wyatt Haste, RN-BSN

## 2012-10-26 NOTE — Progress Notes (Signed)
Patient ID: Deborah Knight, female   DOB: 1965/06/13, 47 y.o.   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 Began with her right eye, had difficulty with opening, then progressed to involve right lip. Also with irritation of left arm. No other rashes on the body. Denies recent exposures. No changes in medications. Has not taken losartan since this was identified as an allergy in April. No SOB, wheezing at this time (although she does have a hx of ashtma). Has used benadryl with moderate relief. Other allergies include shellfish, toradol, latex, none of which have been consumed by the patient  All other systems reviewed and were negative ROS--See HPI  Past Medical History Patient Active Problem List   Diagnosis Date Noted  . Bicipital tenosynovitis 09/04/2012  . Joint derangement, shoulder region 07/10/2012  . Closed dislocation of shoulder 07/10/2012  . Dislocation of left shoulder joint 05/22/2012  . Right facial swelling 04/07/2012  . Rash 04/07/2012  . Tobacco abuse 02/04/2012  . KNEE PAIN, RIGHT 02/07/2010  . GERD 10/10/2009  . DEPRESSION 09/13/2009  . ALLERGIC RHINITIS 06/12/2009  . OBESITY 08/30/2008  . LOW BACK PAIN SYNDROME 08/30/2008  . HYPERLIPIDEMIA 11/03/2006  . ANXIETY 11/03/2006  . HYPERTENSION 11/03/2006  . ASTHMA 11/03/2006   Reviewed problem list.  Medications- reviewed and updated Chief complaint-noted  Objective: BP 133/79  Pulse 80  Temp(Src) 98.5 F (36.9 C) (Oral)  Wt 215 lb (97.523 kg)  BMI 39.31 kg/m2 Gen: NAD, alert, cooperative with exam HEENT: NCAT, EOMI, PERRL, TMs nml Resp: CTABL, no wheezes, non-labored Neuro: Alert and oriented, CNII-XII were formally tested and were intact bilaterally.  No deficits no facial droop no evidence of weakness Skin: mild swelling of the right upper and lower lip as well as periorbitally. Large welt on patient's left upper arm with some excoriations from scratching  Assessment/Plan:  47 y.o F with hx of allergies to losartan as well as shellfish, toradol, latex. Many of which cause angioedema. Patient has been instructed to stop and avoid all these exposures. Denies any today  A: Unknown exposure causing R facial swelling, most likely not CN palsy given absence of deficits on physical exam. No other new foods or medications tried at this time. The swelling has occurred intermittently for the last year. Always self resolved. Not requiring ED visits/hospitalizations.  P: referral to allergist for skin testing, complement levels. Can decide on further workup there. For now continue benadryl, claritin -have refilled her BP meds (amlodipine)- as far as I am aware angioedema not typically associated with norvasc but have instructed patient to give [place] a call if for some reason swelling gets worse after taking (she did not use this morning because she was nervous) -sent in rx for flonase to help with allergic rhinitis  -low threshold for prednisone 60mg  for 5 days if swelling worsens or continues -have reviewed warning signs with pt

## 2012-10-26 NOTE — ED Notes (Signed)
Pt is awake and alert, discharge instructions reviewed and given. Pt advises of to call 911 if symptoms  persist. Pt has no other medical complaints.

## 2012-10-26 NOTE — Patient Instructions (Addendum)
Talullah it was great to meet you! I am sorry you are not feeling so well. I think the best way we can figure out what is going on is by sending you to be formally evaluated by an allergist/immunologist where they can do extensive testing for mold/environment and other exacerbating factors For now continue to take the benadryl and you can start using the nasal spray if this helps your symptoms I am happy to give you a short course of steroids if the swelling begins to get worse or is not going away, please just call our office and let us know Also if for any reason you develop shortness of breathing or difficulty swallowing call 911 or the office ASAP  Charlane Ferretti, MD

## 2012-10-26 NOTE — ED Provider Notes (Signed)
CSN: [id number]     Arrival date & time 10/26/12  1916 History     First MD Initiated Contact with Patient 10/26/12 1940     Chief Complaint  Patient presents with  . Facial Swelling   (Consider location/radiation/quality/duration/timing/severity/associated sxs/prior Treatment) HPI Pt is a 47yo female with hx of HTN, asthma, and seasonal allergies c/o intermittent right sided facial swelling.  Pt states she has had swelling on right side of her face for past 8-6mo. Pt was taken off her losartan after start of symptoms as PCP felt it could be cause of her swelling.  States swelling had subsided until about 82mo ago.  Was intermittent but has stayed consistent over the last 3 days.  First noticed severe right eye swelling when she woke up 3 days ago, the next day the eye swelling had resolved but right cheek swelled, then her upper lip.  Today she has noticed some swelling on the left side of her lower lip.  Reports some scratchiness in her throat but no difficulty breathing or swallowing.  Was taking benadryl every 4-6 hours but advised by PCP to take every 2hrs today and to report to ER if swelling returned, which it did. Also reports intermittent pruritic hives. Denies fever, n/v/d.     Past Medical History  Diagnosis Date  . Hypertension   . Anxiety   . Seasonal allergies   . HLD (hyperlipidemia)   . Asthma    Past Surgical History  Procedure Laterality Date  . Cesarean section  1997  . Hernia repair  1989  . Tubal ligation  1997  . Shoulder arthroscopy     Family History  Problem Relation Age of Onset  . Asthma Father   . Asthma Sister   . Asthma Brother   . Asthma Maternal Grandmother   . Asthma Maternal Grandfather   . Asthma Paternal Grandmother   . Asthma Paternal Grandfather   . Cancer Father   . Hyperlipidemia Mother   . Hyperlipidemia Sister    History  Substance Use Topics  . Smoking status: Current Some Day Smoker    Types: Cigars  . Smokeless tobacco: Not on  file  . Alcohol Use: No   OB History   Grav Para Term Preterm Abortions TAB SAB Ect Mult Living                 Review of Systems  Constitutional: Negative for fever and chills.  HENT: Positive for sore throat ( scratchy) and facial swelling. Negative for trouble swallowing.   Respiratory: Negative for shortness of breath.   Cardiovascular: Negative for chest pain.  Gastrointestinal: Negative for nausea, vomiting and diarrhea.  All other systems reviewed and are negative.    Allergies  Losartan; Shellfish-derived products; Toradol; and Latex  Home Medications   Current Outpatient Rx  Name  Route  Sig  Dispense  Refill  . albuterol (PROVENTIL HFA;VENTOLIN HFA) 108 (90 BASE) MCG/ACT inhaler   Inhalation   Inhale 2 puffs into the lungs every 6 (six) hours as needed. Shortness of breath   1 Inhaler   2   . cyclobenzaprine (FLEXERIL) 10 MG tablet      10 mg. Take 10 mg by mouth 3 (three) times daily as needed for Muscle spasms.         . diphenhydrAMINE (BENADRYL) 25 MG tablet   Oral   Take 25 mg by mouth every 6 (six) hours as needed for itching or allergies.         Marland Kitchen  fluticasone (FLONASE) 50 MCG/ACT nasal spray   Nasal   Place 2 sprays into the nose daily.   16 g   6   . fluticasone-salmeterol (ADVAIR HFA) 115-21 MCG/ACT inhaler   Inhalation   Inhale 2 puffs into the lungs 2 (two) times daily.   1 Inhaler   11   . loratadine (CLARITIN) 10 MG tablet   Oral   Take 1 tablet (10 mg total) by mouth daily.   30 tablet   0   . montelukast (SINGULAIR) 10 MG tablet   Oral   Take 1 tablet (10 mg total) by mouth at bedtime.   30 tablet   6   . omeprazole (PRILOSEC) 20 MG capsule   Oral   Take 20 mg by mouth daily.         Marland Kitchen oxyCODONE (ROXICODONE) 5 MG immediate release tablet      5-15 mg. Take 1-3 tablets (5-15 mg total) by mouth every 4 (four) hours as needed for Pain (1 tab mild pain, 2 tabs moderate pain, 3 severe pain.).         Marland Kitchen  oxyCODONE-acetaminophen (PERCOCET/ROXICET) 5-325 MG per tablet   Oral   Take 2 tablets by mouth 2 (two) times daily as needed for pain.   100 tablet   0   . ranitidine (ZANTAC) 300 MG tablet   Oral   Take 1 tablet (300 mg total) by mouth at bedtime.   30 tablet   6   . sertraline (ZOLOFT) 50 MG tablet   Oral   Take 1 tablet (50 mg total) by mouth daily.   90 tablet   1   . traZODone (DESYREL) 50 MG tablet   Oral   Take 1 tablet (50 mg total) by mouth at bedtime as needed for sleep.   30 tablet   3   . hydrochlorothiazide (HYDRODIURIL) 12.5 MG tablet   Oral   Take 2 tablets (25 mg total) by mouth daily.   30 tablet   1    BP 146/99  Pulse 83  Temp(Src) 98.6 F (37 C) (Oral)  Resp 18  Ht 5\' 2"  (1.575 m)  Wt 215 lb (97.523 kg)  BMI 39.31 kg/m2  SpO2 99% Physical Exam  Nursing note and vitals reviewed. Constitutional: She appears well-developed and well-nourished. No distress.  HENT:  Head: Normocephalic and atraumatic.  Right Ear: Hearing, tympanic membrane, external ear and ear canal normal.  Left Ear: Hearing, tympanic membrane, external ear and ear canal normal.  Nose: Nose normal.  Mouth/Throat: Uvula is midline, oropharynx is clear and moist and mucous membranes are normal. No oropharyngeal exudate, posterior oropharyngeal edema, posterior oropharyngeal erythema or tonsillar abscesses.    Small area of edema to lower left lip   Eyes: Conjunctivae are normal. No scleral icterus.  Neck: Normal range of motion. Neck supple.  Cardiovascular: Normal rate, regular rhythm and normal heart sounds.   Pulmonary/Chest: Effort normal and breath sounds normal. No stridor. No respiratory distress. She has no wheezes. She has no rales. She exhibits no tenderness.  No respiratory distress. Able to speak in full sentences.  No wheezing or stridor.   Abdominal: Soft. Bowel sounds are normal. She exhibits no distension and no mass. There is no tenderness. There is no rebound  and no guarding.  Musculoskeletal: Normal range of motion.  Lymphadenopathy:    She has no cervical adenopathy.  Neurological: She is alert.  Skin: Skin is warm and dry. She is not diaphoretic.  ED Course   Procedures (including critical care time)  Labs Reviewed - No data to display No results found. 1. Right facial swelling     MDM  Pt with intermittent facial swelling.  No signs of airway involvement at this time.  Pt appears well and in no acute distress.  Discussed pt with Dr. Radford Pax.  Pt found to be taking amlodipine which is low risk for angioedema, however, due to no other obvious source of pt's symptoms will have pt discontinue amlodipine and start on HCTZ.  Pt may be discharged home.  Rx: HCTZ, continue taking benadryl.  Strict return precautions and advised to f/u with PCP, Dr. Althea Charon later this week for continued swelling as well as monitoring of BP due to changing BP medication.  Pt verbalized understanding and agreement with tx plan.    Junius Finner, PA-C 10/26/12 2322

## 2012-10-26 NOTE — ED Notes (Signed)
Pt states she has some facial swelling to face. Pt states she saw md yesterday and took her off of her b/p meds d/t could be allergic reaction. Pt states that today she stills has facial swelling noted. And her md told her to come to Ed.

## 2012-10-26 NOTE — ED Notes (Signed)
Pt is awake and alert, pt denies pain or tenderness on palp. Pt report taken nasalcort  and  2 benadryl prior to coming to the ED.

## 2012-10-27 ENCOUNTER — Ambulatory Visit: Payer: No Typology Code available for payment source | Admitting: Physical Therapy

## 2012-10-29 ENCOUNTER — Ambulatory Visit: Payer: No Typology Code available for payment source | Admitting: Physical Therapy

## 2012-10-29 ENCOUNTER — Encounter: Payer: Self-pay | Admitting: Physical Therapy

## 2012-10-29 NOTE — ED Provider Notes (Signed)
Medical screening examination/treatment/procedure(s) were performed by non-physician practitioner and as supervising physician I was immediately available for consultation/collaboration.   Aaban Griep L Ory Elting, MD 10/29/12 1156 

## 2012-11-02 ENCOUNTER — Ambulatory Visit: Payer: No Typology Code available for payment source | Admitting: Physical Therapy

## 2012-11-03 ENCOUNTER — Telehealth: Payer: Self-pay | Admitting: Family Medicine

## 2012-11-03 NOTE — Telephone Encounter (Signed)
Patient is calling to check the status of the referral for the allergist.

## 2012-11-04 ENCOUNTER — Ambulatory Visit: Payer: No Typology Code available for payment source | Admitting: Physical Therapy

## 2012-11-05 ENCOUNTER — Telehealth: Payer: Self-pay | Admitting: Family Medicine

## 2012-11-05 NOTE — Telephone Encounter (Signed)
Pt would like a nurse to call her concerning her BP. JW

## 2012-11-05 NOTE — Telephone Encounter (Signed)
Pt reports that last week I went to the hospital because my face starting swelling again and the hospital switched my pill. Today my BP was 143/99. Pt advised to stop smoking, increase water intake and exercise. Also suggested to take BP at home when pt is not anxious about BP readings and keep record of time/.day/.activity and bring with her to appointment on 10/2 Wyatt Haste, RN-BSN

## 2012-11-11 ENCOUNTER — Ambulatory Visit: Payer: No Typology Code available for payment source | Attending: Family Medicine | Admitting: Physical Therapy

## 2012-11-11 DIAGNOSIS — W19XXXA Unspecified fall, initial encounter: Secondary | ICD-10-CM | POA: Insufficient documentation

## 2012-11-11 DIAGNOSIS — S43006A Unspecified dislocation of unspecified shoulder joint, initial encounter: Secondary | ICD-10-CM | POA: Insufficient documentation

## 2012-11-11 DIAGNOSIS — M25619 Stiffness of unspecified shoulder, not elsewhere classified: Secondary | ICD-10-CM | POA: Insufficient documentation

## 2012-11-11 DIAGNOSIS — M25519 Pain in unspecified shoulder: Secondary | ICD-10-CM | POA: Insufficient documentation

## 2012-11-11 DIAGNOSIS — IMO0001 Reserved for inherently not codable concepts without codable children: Secondary | ICD-10-CM | POA: Insufficient documentation

## 2012-11-11 DIAGNOSIS — R293 Abnormal posture: Secondary | ICD-10-CM | POA: Insufficient documentation

## 2012-11-11 NOTE — Telephone Encounter (Signed)
LMOVM for patient to return my call.  Aylyn Wenzler L, CMA  

## 2012-11-16 ENCOUNTER — Ambulatory Visit: Payer: No Typology Code available for payment source | Admitting: Physical Therapy

## 2012-11-17 ENCOUNTER — Encounter: Payer: Self-pay | Admitting: Physical Therapy

## 2012-11-18 ENCOUNTER — Encounter: Payer: Self-pay | Admitting: Family Medicine

## 2012-11-18 ENCOUNTER — Ambulatory Visit (INDEPENDENT_AMBULATORY_CARE_PROVIDER_SITE_OTHER): Payer: No Typology Code available for payment source | Admitting: Family Medicine

## 2012-11-18 ENCOUNTER — Ambulatory Visit: Payer: No Typology Code available for payment source | Admitting: Physical Therapy

## 2012-11-18 VITALS — BP 166/118 | HR 66 | Temp 98.4°F | Ht 62.0 in | Wt 214.5 lb

## 2012-11-18 DIAGNOSIS — I10 Essential (primary) hypertension: Secondary | ICD-10-CM

## 2012-11-18 NOTE — Progress Notes (Signed)
Patient ID: Deborah Knight, female   DOB: 09/15/1965, 47 y.o.   MRN: [id number]  [facility] Anecia Nusbaum M. Mitsuo Budnick, MD Phone: [phone]   Subjective: HPI: Patient is a 47 y.o. female presenting to clinic today for same day appointment.  She was seen at PT earlier today (s/p shoulder surgery) and her BP was too elevated, and she was sent to our clinic for evaluation. She has history of HTN. She states she was taken off Losartan and Amlodipine for facial swelling but she states she continues to have the swelling despite being off the medication. She has appointment next week for allergy specialist. Currently on HCTZ only and it is not controlling her BP. She states she is stressed, but no other changes. Not eating much salt now.  No headaches, no vision changes, no chest pain, no SOB, no leg edema.  History Reviewed: Some day smoker.  ROS: Please see HPI above.  Objective: Office vital signs reviewed. BP 166/118  Pulse 66  Temp(Src) 98.4 F (36.9 C) (Oral)  Ht 5\' 2"  (1.575 m)  Wt 214 lb 8 oz (97.297 kg)  BMI 39.22 kg/m2  Physical Examination:  General: Awake, alert. NAD HEENT: Atraumatic, normocephalic Neck: No masses palpated. No LAD Pulm: CTAB, no wheezes Cardio: RRR, no murmurs appreciated Abdomen:+BS, soft, nontender, nondistended Extremities: No edema Neuro: Grossly intact  Assessment: 47 y.o. female with elevated BP  Plan: See Problem List and After Visit Summary

## 2012-11-18 NOTE — Patient Instructions (Addendum)
Restart the Amlodipine you have at home. You can keep taking the HCTZ as well.   Please come back in 2 days for BP recheck.  If you have any SOB, wheezing, throat swelling or rash, stop the amlodipine and come in to see [place] immediately. Salah Burlison M. Dessa Ledee, M.D.

## 2012-11-19 ENCOUNTER — Encounter: Payer: Self-pay | Admitting: Physical Therapy

## 2012-11-19 NOTE — Assessment & Plan Note (Signed)
BP greatly elevated today in clinic. She is only on HCTZ 25mg . Since she is continuing to have the swelling of her face, I do not think it is related to her anti-hypertensives. Will restart Amlodipine since it has worked before and she has Rx at home. Con't HCTZ and follow up with me in 48 hours for recheck. If she has any signs of allergic reaction, she should go to ED for evaluation, or if she develops HA, CP, SOB she should also seek immediate medical care. Patient agrees.

## 2012-11-20 ENCOUNTER — Ambulatory Visit: Payer: Self-pay | Admitting: Family Medicine

## 2012-11-23 ENCOUNTER — Ambulatory Visit: Payer: No Typology Code available for payment source | Admitting: Physical Therapy

## 2012-11-25 ENCOUNTER — Telehealth: Payer: Self-pay | Admitting: Family Medicine

## 2012-11-25 ENCOUNTER — Ambulatory Visit: Payer: No Typology Code available for payment source | Admitting: Physical Therapy

## 2012-11-25 NOTE — Telephone Encounter (Signed)
Patient was referred to Allergy & Asthma, and they are wanting her to get some blood work done to see what exactly she is allergic to. She has GCCN and labwork is not covered at the place where office wants her to get labwork done. Patient would like to get labwork done here. Advised patient that we do not do labwork from another Dr. Isidore Moos. She states she will not be able to afford labwork

## 2012-11-25 NOTE — Telephone Encounter (Signed)
Will fwd. To PCP for review .Deborah Knight  

## 2012-11-26 ENCOUNTER — Telehealth: Payer: Self-pay | Admitting: Family Medicine

## 2012-11-26 DIAGNOSIS — Z789 Other specified health status: Secondary | ICD-10-CM

## 2012-11-26 NOTE — Telephone Encounter (Signed)
Received message that Dinara Lupu was seen at [facility] (11/24/12), as she was referred for allergy testing and work-up. She was unable to get blood work done through their lab due to cost, and would like to have blood work done at [facility] with [employer]. I agreed to proceed with ordering blood work through [facility] and will fax results to [facility]. I've already called Allergy and Ast[facility], spoke with clinical staff and plan to order (per their request): CMP, CBC w/ diff, C1 esterase inhib lvl, C1 esterase function, Factor XII activity level.  Called patient, discussed current plan to proceed with ordering labs and collecting blood work at [facility], plan to schedule lab only visit 11/30/12 to draw blood. Patient agrees with plan, and will be called by [facility] and All[facility] clinic once they receive lab results to schedule follow-up appointment.  Saralyn Pilar, DO

## 2012-11-26 NOTE — Telephone Encounter (Signed)
Reviewed patient's chart. Contacted [facility]ergy and Ast[facility] clinic and contacted patient. Confirmed allergy testing labs required, agreed to order labs at [facility] 9/22 and fax results to [facility]. Patient agrees with plan. See detailed telephone note.  Saralyn Pilar, DO

## 2012-11-30 ENCOUNTER — Other Ambulatory Visit: Payer: No Typology Code available for payment source

## 2012-11-30 DIAGNOSIS — Z789 Other specified health status: Secondary | ICD-10-CM

## 2012-11-30 LAB — CBC WITH DIFFERENTIAL/PLATELET
Basophils Absolute: 0 10*3/uL (ref 0.0–0.1)
Basophils Relative: 0 % (ref 0–1)
Eosinophils Absolute: 0.3 10*3/uL (ref 0.0–0.7)
HCT: 38.5 % (ref 36.0–46.0)
MCH: 28.2 pg (ref 26.0–34.0)
MCHC: 33.5 g/dL (ref 30.0–36.0)
Monocytes Absolute: 0.6 10*3/uL (ref 0.1–1.0)
Neutro Abs: 1.4 10*3/uL — ABNORMAL LOW (ref 1.7–7.7)
Neutrophils Relative %: 29 % — ABNORMAL LOW (ref 43–77)
RDW: 15.3 % (ref 11.5–15.5)

## 2012-11-30 LAB — COMPREHENSIVE METABOLIC PANEL
AST: 15 U/L (ref 0–37)
Albumin: 3.9 g/dL (ref 3.5–5.2)
Alkaline Phosphatase: 96 U/L (ref 39–117)
BUN: 7 mg/dL (ref 6–23)
Potassium: 3.4 mEq/L — ABNORMAL LOW (ref 3.5–5.3)
Total Bilirubin: 0.2 mg/dL — ABNORMAL LOW (ref 0.3–1.2)

## 2012-11-30 NOTE — Progress Notes (Unsigned)
CMP,CBC WITH DIFF,C1 ESTRACE INHIBITOR PANEL,C1 ESTRACE INHIBITOR FUNCTIONAL AND FACTOR 12 ASSAY DONE TODAY Deborah Knight

## 2012-12-01 ENCOUNTER — Ambulatory Visit: Payer: No Typology Code available for payment source | Admitting: Physical Therapy

## 2012-12-03 ENCOUNTER — Ambulatory Visit: Payer: No Typology Code available for payment source | Admitting: Physical Therapy

## 2012-12-03 LAB — FACTOR 12 ASSAY: Factor XII Activity: 127 % (ref 50–150)

## 2012-12-03 LAB — C1 ESTERASE INHIBITOR, FUNCTIONAL: C1INH Functional/C1INH Total MFr SerPl: >100 % (ref >=68)

## 2012-12-04 ENCOUNTER — Telehealth: Payer: Self-pay | Admitting: Family Medicine

## 2012-12-04 NOTE — Telephone Encounter (Signed)
[facility] faxed the final lab results requested by Dr. Laurette Schimke (CMP, CBC, C1 esterase inhib level, C1 esterase function, and Factor XII Activity) to [facility]. Labs were drawn at [facility] due to insurance coverage and cost. Called [facility], confirmed that they have received all of the faxed lab results for Dr. Kathyrn Lass review, and they will plan to call patient to schedule follow-up appointment for further management using lab results at their clinic.  Saralyn Pilar, DO

## 2012-12-07 ENCOUNTER — Ambulatory Visit: Payer: No Typology Code available for payment source | Admitting: Physical Therapy

## 2012-12-09 ENCOUNTER — Ambulatory Visit: Payer: No Typology Code available for payment source | Attending: Family Medicine | Admitting: Physical Therapy

## 2012-12-09 DIAGNOSIS — M25619 Stiffness of unspecified shoulder, not elsewhere classified: Secondary | ICD-10-CM | POA: Insufficient documentation

## 2012-12-09 DIAGNOSIS — S43006A Unspecified dislocation of unspecified shoulder joint, initial encounter: Secondary | ICD-10-CM | POA: Insufficient documentation

## 2012-12-09 DIAGNOSIS — R293 Abnormal posture: Secondary | ICD-10-CM | POA: Insufficient documentation

## 2012-12-09 DIAGNOSIS — W19XXXA Unspecified fall, initial encounter: Secondary | ICD-10-CM | POA: Insufficient documentation

## 2012-12-09 DIAGNOSIS — IMO0001 Reserved for inherently not codable concepts without codable children: Secondary | ICD-10-CM | POA: Insufficient documentation

## 2012-12-09 DIAGNOSIS — M25519 Pain in unspecified shoulder: Secondary | ICD-10-CM | POA: Insufficient documentation

## 2012-12-14 ENCOUNTER — Ambulatory Visit (INDEPENDENT_AMBULATORY_CARE_PROVIDER_SITE_OTHER): Payer: No Typology Code available for payment source | Admitting: Family Medicine

## 2012-12-14 ENCOUNTER — Encounter: Payer: Self-pay | Admitting: Family Medicine

## 2012-12-14 ENCOUNTER — Ambulatory Visit: Payer: No Typology Code available for payment source | Admitting: Physical Therapy

## 2012-12-14 VITALS — BP 125/84 | HR 75 | Ht 62.0 in | Wt 220.0 lb

## 2012-12-14 DIAGNOSIS — R220 Localized swelling, mass and lump, head: Secondary | ICD-10-CM

## 2012-12-14 DIAGNOSIS — J45909 Unspecified asthma, uncomplicated: Secondary | ICD-10-CM

## 2012-12-14 DIAGNOSIS — E669 Obesity, unspecified: Secondary | ICD-10-CM

## 2012-12-14 DIAGNOSIS — Z23 Encounter for immunization: Secondary | ICD-10-CM

## 2012-12-14 DIAGNOSIS — I10 Essential (primary) hypertension: Secondary | ICD-10-CM

## 2012-12-14 DIAGNOSIS — J309 Allergic rhinitis, unspecified: Secondary | ICD-10-CM

## 2012-12-14 MED ORDER — HYDROCHLOROTHIAZIDE 25 MG PO TABS: 25.0000 mg | ORAL_TABLET | Freq: Every day | ORAL | Status: DC

## 2012-12-14 MED ORDER — MONTELUKAST SODIUM 10 MG PO TABS: 10.0000 mg | ORAL_TABLET | Freq: Every day | ORAL | Status: DC

## 2012-12-14 MED ORDER — CETIRIZINE HCL 10 MG PO TABS: 10.0000 mg | ORAL_TABLET | Freq: Every day | ORAL | Status: DC

## 2012-12-14 MED ORDER — AMLODIPINE BESYLATE 10 MG PO TABS: 10.0000 mg | ORAL_TABLET | Freq: Every day | ORAL | Status: DC

## 2012-12-14 NOTE — Assessment & Plan Note (Addendum)
Reported R-facial swelling, appears minimal on exam today, with noted recent improvement. Denies any airway compromise. Admits occasional difficulty with swallowing some pills.  Plan: 1. No change to current medications. Continue Amlodipine, since pt has had swelling while off of Amlodipine. 2. Continue to follow-up with Allergist in a few weeks.

## 2012-12-14 NOTE — Progress Notes (Signed)
Subjective:     Patient ID: Deborah Knight, female   DOB: 08-Jan-1966, 47 y.o.   MRN: [id number]  HPI  CHRONIC HYPERTENSION  BP significantly improved today - 125/84, HR 75  Meds - HCTZ 12.5mg  x2 tabs daily (25mg  total), Amlodipine 10mg  daily (Reports good compliance, except out of HCTZ). Tolerating well, w/o complaints.  - Previously Amlodipine was held 10/26/12 due to concern of angioedema (facial swelling), however has had repeated episodes since DC'd, since resumed therapy 11/18/12  Denies CP, dyspnea, claudication, HA, dizziness / lightheadedness.  Admits facial edema, appears unrelated to medications. No identifiable trigger.  Preventitive Healthcare:  Exercise: yes - walking about 30 mins multiple times weekly  ALLERGIES / ASTHMA: Reports recent worsening of asthma due to change in cold weather. She uses Albuterol preventatively, almost daily, before exercise or activity and frequently at night prior to sleeping (prevent night-time symptoms). Continues to take Advair HFA 2 puffs BID (not diskus due to 32yr supply MAP program). She is scheduled to see Allergist in a few weeks, had blood work completed at [facility], contacted by [facility], told that labwork was normal. Recommended she start Certirizine.   Admits occasional wheezing, without significant chest tightness.  Denies CP or dyspnea. Denies every being intubated due to asthma exacerbation.  WEIGHT GAIN: Weight up 5 lbs in 1 month, and overall 15 lbs in 4 months.  She attributes weight to inc appetite and dietary choices.  No prior hx of CHF or fluid retention. Only reports some LE edema in evenings at end of day.  Social Hx - Continues to smoke.   Review of Systems  See above HPI.     Objective:   Physical Exam  BP 125/84  Pulse 75  Ht 5\' 2"  (1.575 m)  Wt 220 lb (99.791 kg)  BMI 40.23 kg/m2  General: 47 yo Female, very pleasant, conversational, NAD  HEENT: PERRLA, EOMI, bilateral nasal turbinate  edema with erythema and mild congestion, pharynx clear. No appreciable facial edema of lips or cheeks Neck: Soft, no LAD Pulm: CTAB, no wheezes, crackles or rhonchi. Normal respiratory effort, without increased work of breathing. Cardio: RRR, no murmurs appreciated  Abdomen: soft, ND/NT, +BS Extremities: No edema, +2 peripheral pulses intact Neuro: Grossly intact

## 2012-12-14 NOTE — Assessment & Plan Note (Addendum)
BP significantly improved today - 125/84, HR 75 Meds -  HCTZ 12.5mg  x2 tabs daily (25mg  total), Amlodipine 10mg  daily (Reports good compliance, except out of HCTZ). Tolerating well, w/o complaints.          - Previously Amlodipine was held 10/26/12 due to concern of angioedema (facial swelling), however has had repeated episodes since DC'd, since resumed therapy 11/18/12 Denies CP, dyspnea, claudication, HA, dizziness / lightheadedness. Admits facial edema, appears unrelated to medications. No identifiable trigger.  Plan:  1. Continue HCTZ 25mg  daily (rx 25mg  tabs, sent to [facility]), Amlodipine 10mg  daily (#90 at [facility]) 2. Advised to quit or cutback smoking. 3. Lifestyle Modifications - Increase exercise plan to walk at least 30 mins every other day. 4. Monitor BP at home or at drug store occasionally  Future Consideration: If need additional BP agent in future, consider beta-blocker, due to allergy to ACEi/ARB. On CCB, Thiazide

## 2012-12-14 NOTE — Patient Instructions (Addendum)
Dear Deborah Knight, Thank you for coming in to clinic today. It was good to see you again!  Today we discussed your blood pressure, asthma, and allergies. 1. Your blood pressure is excellent today. Keep up the good work. Continue to take HCTZ and Amlodipine. Reduce salt in diet, exercise daily or every other day. 2. Please keep a detailed log for the next few weeks on how often you are using your Albuterol as we discussed. I want to make sure that your breathing problems are related to your asthma and not something else, like fluid. 3. I will refill your allergy medications, please follow-up with your allergist apt in a few weeks. 4. Please consider quitting smoking, as this will significantly help your blood pressure, allergies, asthma, and breathing. I am available to help if you need any resources  Please schedule a follow-up appointment with 1 month to evaluate BP and Asthma.  Please call Dr. Gerilyn Pilgrim for nutrition apt to discuss appropriate dietary options for weight loss.  If you have any other questions or concerns, please feel free to call the clinic to contact me. You may also schedule an earlier appointment if necessary.  However, if your symptoms get significantly worse, please go to the Emergency Department to seek immediate medical attention.  Saralyn Pilar, DO [facility]

## 2012-12-14 NOTE — Assessment & Plan Note (Addendum)
Reports recent worsening of asthma due to change in cold weather. She uses Albuterol preventatively, almost daily, before exercise or activity and frequently at night prior to sleeping (prevent night-time symptoms). Continues to take Advair HFA 2 puffs BID (not diskus due to 64yr supply MAP program). Admits occasional wheezing, without significant chest tightness. Denies CP or dyspnea. Denies every being intubated due to asthma exacerbation.  Plan: 1. Advised patient only take Albuterol as needed and make a detailed log of her respiratory symptoms and Albuterol usage for the next few weeks. Goal to determine how well her asthma is currently controlled. Will re-evaluate asthma in 1 month. If she is actually requiring Albuterol daily, then will need to increase strength of ICS, otherwise she may actually be well-controlled on current therapy. 2. Continue Advair HFA, Singulair 3. Start Zyrtec for allergy component

## 2012-12-14 NOTE — Assessment & Plan Note (Addendum)
Weight up 5 lbs in 1 month, and overall 15 lbs in 4 months. She attributes weight to inc appetite and dietary choices. No prior hx of CHF or fluid retention. Clinically no edema on exam. Patient does describe some infrequent nighttime respiratory symptoms, attributed to asthma and uses Albuterol prophylactically with improvement.  Plan: 1. Referral to Dr. Gerilyn Pilgrim for dietary counseling and meal planning, goal of weight loss. 2. Possible component of inc fluid retention, may account for sudden rise in weight, will re-evaluate respiratory status in 1 month (see asthma). 3. Future - Consider further work-up with ProBNP, CXR, and consider additional diuretic therapy. However, clinical suspicion of CHF is low.

## 2012-12-16 ENCOUNTER — Ambulatory Visit: Payer: No Typology Code available for payment source | Admitting: Physical Therapy

## 2012-12-21 ENCOUNTER — Ambulatory Visit: Payer: No Typology Code available for payment source | Admitting: Physical Therapy

## 2012-12-23 ENCOUNTER — Ambulatory Visit: Payer: No Typology Code available for payment source | Admitting: Physical Therapy

## 2012-12-26 ENCOUNTER — Encounter (HOSPITAL_COMMUNITY): Payer: Self-pay | Admitting: Emergency Medicine

## 2012-12-26 ENCOUNTER — Emergency Department (HOSPITAL_COMMUNITY)
Admission: EM | Admit: 2012-12-26 | Discharge: 2012-12-26 | Disposition: A | Discharge status: Home / Self Care | Payer: No Typology Code available for payment source | Attending: Emergency Medicine | Admitting: Emergency Medicine

## 2012-12-26 DIAGNOSIS — IMO0002 Reserved for concepts with insufficient information to code with codable children: Secondary | ICD-10-CM | POA: Insufficient documentation

## 2012-12-26 DIAGNOSIS — T783XXA Angioneurotic edema, initial encounter: Secondary | ICD-10-CM

## 2012-12-26 DIAGNOSIS — Z862 Personal history of diseases of the blood and blood-forming organs and certain disorders involving the immune mechanism: Secondary | ICD-10-CM | POA: Insufficient documentation

## 2012-12-26 DIAGNOSIS — Z91040 Latex allergy status: Secondary | ICD-10-CM | POA: Insufficient documentation

## 2012-12-26 DIAGNOSIS — Z8639 Personal history of other endocrine, nutritional and metabolic disease: Secondary | ICD-10-CM | POA: Insufficient documentation

## 2012-12-26 DIAGNOSIS — J45909 Unspecified asthma, uncomplicated: Secondary | ICD-10-CM | POA: Insufficient documentation

## 2012-12-26 DIAGNOSIS — I10 Essential (primary) hypertension: Secondary | ICD-10-CM | POA: Insufficient documentation

## 2012-12-26 DIAGNOSIS — F411 Generalized anxiety disorder: Secondary | ICD-10-CM | POA: Insufficient documentation

## 2012-12-26 DIAGNOSIS — Z79899 Other long term (current) drug therapy: Secondary | ICD-10-CM | POA: Insufficient documentation

## 2012-12-26 DIAGNOSIS — F17200 Nicotine dependence, unspecified, uncomplicated: Secondary | ICD-10-CM | POA: Insufficient documentation

## 2012-12-26 DIAGNOSIS — T448X5A Adverse effect of centrally-acting and adrenergic-neuron-blocking agents, initial encounter: Secondary | ICD-10-CM | POA: Insufficient documentation

## 2012-12-26 MED ORDER — SODIUM CHLORIDE 0.9 % IV BOLUS (SEPSIS)
1000.0000 mL | Freq: Once | INTRAVENOUS | Status: AC
  Administered 2012-12-26: 1000 mL via INTRAVENOUS

## 2012-12-26 MED ORDER — DIPHENHYDRAMINE HCL 50 MG/ML IJ SOLN
50.0000 mg | Freq: Once | INTRAMUSCULAR | Status: AC
Start: 2012-12-26 — End: 2012-12-26
  Administered 2012-12-26: 50 mg via INTRAVENOUS
  Filled 2012-12-26: qty 1

## 2012-12-26 MED ORDER — DEXAMETHASONE SODIUM PHOSPHATE 10 MG/ML IJ SOLN
10.0000 mg | Freq: Once | INTRAMUSCULAR | Status: AC
  Administered 2012-12-26: 10 mg via INTRAVENOUS
  Filled 2012-12-26: qty 1

## 2012-12-26 MED ORDER — FAMOTIDINE IN NACL 20-0.9 MG/50ML-% IV SOLN
20.0000 mg | Freq: Once | INTRAVENOUS | Status: AC
  Administered 2012-12-26: 20 mg via INTRAVENOUS
  Filled 2012-12-26 (×2): qty 50

## 2012-12-26 NOTE — ED Notes (Signed)
She c/o upper lip/minor facial swelling which began yesterday evening.  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  She denies any throat tightness or involvement, and is having no trouble breathing.

## 2012-12-26 NOTE — ED Notes (Signed)
Swelling still present on patient's upper lip.  No difficulty breathing.  Patient requesting something to eat and drink.

## 2012-12-26 NOTE — ED Notes (Signed)
Bed: WA03 Expected date:  Expected time:  Means of arrival:  Comments: 

## 2012-12-26 NOTE — ED Provider Notes (Signed)
CSN: [id number]     Arrival date & time 12/26/12  0747 History   First MD Initiated Contact with Patient 12/26/12 [phone]     Chief Complaint  Patient presents with  . Allergic Reaction   (Consider location/radiation/quality/duration/timing/severity/associated sxs/prior Treatment) HPI  47yf with lip swelling. Began yesterday evening. No trauma. as been stable overnight. Hx of recurrent similar symptoms. No clear etiology. Has been evaluated by allergy/immunology. Has epipens. Did not use. No difficulty breathing or swallowing. Has not been intubated previously for this. No fever. No abdominal pain. No n/v/d. No dizziness, lightheadedness or sob. No rash. No itching.   Past Medical History  Diagnosis Date  . Hypertension   . Anxiety   . Seasonal allergies   . HLD (hyperlipidemia)   . Asthma    Past Surgical History  Procedure Laterality Date  . Cesarean section  1997  . Hernia repair  1989  . Tubal ligation  1997  . Shoulder arthroscopy     Family History  Problem Relation Age of Onset  . Asthma Father   . Asthma Sister   . Asthma Brother   . Asthma Maternal Grandmother   . Asthma Maternal Grandfather   . Asthma Paternal Grandmother   . Asthma Paternal Grandfather   . Cancer Father   . Hyperlipidemia Mother   . Hyperlipidemia Sister    History  Substance Use Topics  . Smoking status: Current Some Day Smoker    Types: Cigars  . Smokeless tobacco: Not on file  . Alcohol Use: No   OB History   Grav Para Term Preterm Abortions TAB SAB Ect Mult Living                 Review of Systems  All systems reviewed and negative, other than as noted in HPI.   Allergies  Losartan; Shellfish-derived products; Toradol; and Latex  Home Medications   Current Outpatient Rx  Name  Route  Sig  Dispense  Refill  . albuterol (PROVENTIL HFA;VENTOLIN HFA) 108 (90 BASE) MCG/ACT inhaler   Inhalation   Inhale 2 puffs into the lungs every 6 (six) hours as needed for wheezing.  Shortness of breath         . amLODipine (NORVASC) 10 MG tablet   Oral   Take 1 tablet (10 mg total) by mouth daily.   90 tablet   1   . cetirizine (ZYRTEC) 10 MG tablet   Oral   Take 1 tablet (10 mg total) by mouth daily.   90 tablet   3   . cyclobenzaprine (FLEXERIL) 10 MG tablet   Oral   Take 10 mg by mouth 2 (two) times daily as needed for muscle spasms. Take 10 mg by mouth 3 (three) times daily as needed for Muscle spasms.         . diphenhydrAMINE (BENADRYL) 25 MG tablet   Oral   Take 25 mg by mouth every 6 (six) hours as needed for itching or allergies.         . fluticasone (FLONASE) 50 MCG/ACT nasal spray   Nasal   Place 2 sprays into the nose daily.   16 g   6   . fluticasone-salmeterol (ADVAIR HFA) 115-21 MCG/ACT inhaler   Inhalation   Inhale 2 puffs into the lungs 2 (two) times daily.   1 Inhaler   11   . hydrochlorothiazide (HYDRODIURIL) 25 MG tablet   Oral   Take 1 tablet (25 mg total) by mouth daily.  90 tablet   1   . HYDROcodone-acetaminophen (NORCO) 7.5-325 MG per tablet   Oral   Take 1 tablet by mouth every 4 (four) hours as needed for pain. Take 1 tablet by mouth every 6 (six) hours as needed for Pain.         . montelukast (SINGULAIR) 10 MG tablet   Oral   Take 1 tablet (10 mg total) by mouth at bedtime.   90 tablet   3   . omeprazole (PRILOSEC) 20 MG capsule   Oral   Take 20 mg by mouth daily.         . sertraline (ZOLOFT) 50 MG tablet   Oral   Take 1 tablet (50 mg total) by mouth daily.   90 tablet   1   . traZODone (DESYREL) 50 MG tablet   Oral   Take 1 tablet (50 mg total) by mouth at bedtime as needed for sleep.   30 tablet   3    There were no vitals taken for this visit. Physical Exam  Nursing note and vitals reviewed. Constitutional: She appears well-developed and well-nourished. No distress.  HENT:  Head: Normocephalic.  Marked swellingwhich appears to be localized to upper lip. Oropharynx clear.  Handling secretions. Normal sounding phonation. Neck supple. No stridor.   Eyes: Conjunctivae are normal. Right eye exhibits no discharge. Left eye exhibits no discharge.  Neck: Neck supple.  Cardiovascular: Normal rate, regular rhythm and normal heart sounds.  Exam reveals no gallop and no friction rub.   No murmur heard. Pulmonary/Chest: Effort normal and breath sounds normal. No respiratory distress.  Abdominal: Soft. She exhibits no distension. There is no tenderness.  Musculoskeletal: She exhibits no edema and no tenderness.  Neurological: She is alert.  Skin: Skin is warm and dry.  Psychiatric: She has a normal mood and affect. Her behavior is normal. Thought content normal.    ED Course  Procedures (including critical care time) Labs Review Labs Reviewed - No data to display Imaging Review No results found.  EKG Interpretation   None       MDM   1. Angioedema, initial encounter     47 year old female with angioedema. This is a recurrent phenomenon. She's not on ACE inhibitor. She is on amlodipine but this was discontinued in the past and she still had symptoms. She reports she has been seen by an allergist but that testing did not reveal an exact etiology. Pt unsure of specific studies. Initially ordered c1 esterase inhibitor studies, but when reviewing records, she had these just last month. These were normal. There may be some more benefit in checking during acute symptoms, but my familiarity with these studies is low and unsure of cost-benefit particularly with such recent normal results. Clinically her symptoms are fairly mild currently. No evidence of air way compromise, HD instability of other signs of anaphylaxis. Will x with antihistamines/steroids and observe. Anticipate eventual DC. Pt has epi-pens and understands indication for usage. May benefit from follow-up with allergy/immunology again.   No progression of symptoms or new complaints. HD stable and no signs of  impeding airway compromise.   Raeford Razor, MD 12/30/12 2355

## 2012-12-26 NOTE — ED Notes (Cosign Needed)
Patient sitting up in bed eating sandwich

## 2012-12-29 ENCOUNTER — Ambulatory Visit: Payer: No Typology Code available for payment source | Admitting: Physical Therapy

## 2012-12-31 ENCOUNTER — Ambulatory Visit: Payer: No Typology Code available for payment source | Admitting: Physical Therapy

## 2012-12-31 ENCOUNTER — Encounter: Payer: Self-pay | Admitting: Physical Therapy

## 2013-01-05 ENCOUNTER — Telehealth: Payer: Self-pay | Admitting: Family Medicine

## 2013-01-05 ENCOUNTER — Ambulatory Visit: Payer: No Typology Code available for payment source | Admitting: Physical Therapy

## 2013-01-05 NOTE — Telephone Encounter (Signed)
Pt called and would like a nurse to call concerning her PT and she has been having a high BP  150/90. She also feels like she has pressure on her head and when she moves her head certain ways she feels pressure on the right ear and neck . JW

## 2013-01-05 NOTE — Telephone Encounter (Signed)
Spoke with patient.  Has been "stressed."  C/o right neck and shoulder pain x 3 days.  Felt like "crook" in neck.  Took ibuprofen and it resolves for short time and returns.  Patient states she feels "calmer."  Has PT on Thursday and they are concerned about her elevated BP.  Patient requesting appt.  Scheduled appt for tomorrow with Dr. Lum Babe for 10:30 am.  Gaylene Brooks, RN

## 2013-01-06 ENCOUNTER — Encounter: Payer: Self-pay | Admitting: Family Medicine

## 2013-01-06 ENCOUNTER — Ambulatory Visit (INDEPENDENT_AMBULATORY_CARE_PROVIDER_SITE_OTHER): Payer: No Typology Code available for payment source | Admitting: Family Medicine

## 2013-01-06 VITALS — BP 113/78 | HR 88 | Temp 98.7°F | Ht 62.0 in | Wt 217.6 lb

## 2013-01-06 DIAGNOSIS — Z5189 Encounter for other specified aftercare: Secondary | ICD-10-CM

## 2013-01-06 DIAGNOSIS — T783XXD Angioneurotic edema, subsequent encounter: Secondary | ICD-10-CM

## 2013-01-06 DIAGNOSIS — I10 Essential (primary) hypertension: Secondary | ICD-10-CM

## 2013-01-06 DIAGNOSIS — T783XXA Angioneurotic edema, initial encounter: Secondary | ICD-10-CM | POA: Insufficient documentation

## 2013-01-06 MED ORDER — SODIUM CHLORIDE 0.9 % IV SOLN: 125.0000 mg | Freq: Once | INTRAVENOUS | Status: DC

## 2013-01-06 MED ORDER — METHYLPREDNISOLONE SODIUM SUCC 125 MG IJ SOLR
125.0000 mg | Freq: Once | INTRAMUSCULAR | Status: AC
  Administered 2013-01-06: 125 mg via INTRAMUSCULAR

## 2013-01-06 NOTE — Progress Notes (Signed)
Subjective:     Patient ID: Deborah Knight, female   DOB: [birth year], 47 y.o.   MRN: [id number]  HPI [id number] to follow up with high BP,currently on Norvasc 10mg  and HCTZ 25 mg qd,last dose of her medication was this morning. Facial swelling: Started today from the back of her head on the right side,feels pressure on the back of her skull moving to her right face now. She denies any itching,no difficulty swallowing.No SOB. She sense her lip swelling is about to start,this has been an issue she has been dealing with for the last 1 yr,she was recently at the ED few weeks ago for lip swelling,was started on steroid,benadryl,also has epipen at home. She denies any new medication or change in her diet. She was seen at the allergy clinic for allergy testing last month and all test came back normal,she did not follow up with the allergist due to financial constraint.  Current Outpatient Prescriptions on File Prior to Visit  Medication Sig Dispense Refill  . albuterol (PROVENTIL HFA;VENTOLIN HFA) 108 (90 BASE) MCG/ACT inhaler Inhale 2 puffs into the lungs every 6 (six) hours as needed for wheezing. Shortness of breath      . amLODipine (NORVASC) 10 MG tablet Take 1 tablet (10 mg total) by mouth daily.  90 tablet  1  . cetirizine (ZYRTEC) 10 MG tablet Take 1 tablet (10 mg total) by mouth daily.  90 tablet  3  . diphenhydrAMINE (BENADRYL) 25 MG tablet Take 25 mg by mouth every 6 (six) hours as needed for itching or allergies.      . fluticasone (FLONASE) 50 MCG/ACT nasal spray Place 2 sprays into the nose daily as needed (congestion).      . fluticasone-salmeterol (ADVAIR HFA) 115-21 MCG/ACT inhaler Inhale 2 puffs into the lungs 2 (two) times daily.  1 Inhaler  11  . hydrochlorothiazide (HYDRODIURIL) 25 MG tablet Take 1 tablet (25 mg total) by mouth daily.  90 tablet  1  . HYDROcodone-acetaminophen (NORCO) 7.5-325 MG per tablet Take 1 tablet by mouth every 4 (four) hours as needed for pain. Take 1 tablet  by mouth every 6 (six) hours as needed for Pain.      Marland Kitchen ibuprofen (ADVIL,MOTRIN) 800 MG tablet Take 800 mg by mouth every 8 (eight) hours as needed for pain (headache).      . montelukast (SINGULAIR) 10 MG tablet Take 1 tablet (10 mg total) by mouth at bedtime.  90 tablet  3  . omeprazole (PRILOSEC) 20 MG capsule Take 20 mg by mouth daily.      . sertraline (ZOLOFT) 50 MG tablet Take 1 tablet (50 mg total) by mouth daily.  90 tablet  1   No current facility-administered medications on file prior to visit.   Past Medical History  Diagnosis Date  . Hypertension   . Anxiety   . Seasonal allergies   . HLD (hyperlipidemia)   . Asthma       Review of Systems  HENT: Positive for facial swelling.   Respiratory: Negative.   Cardiovascular: Negative.   Genitourinary: Negative.   Skin: Negative for rash.  Neurological: Negative.   All other systems reviewed and are negative.   Filed Vitals:   01/06/13 1111  BP: 113/78  Pulse: 88  Temp: 98.7 F (37.1 C)  TempSrc: Oral  Height: 5\' 2"  (1.575 m)  Weight: 217 lb 9.6 oz (98.703 kg)       Objective:   Physical Exam  Nursing note and  vitals reviewed. Constitutional: She appears well-developed. No distress.  HENT:  Head: Normocephalic.  Right Ear: Tympanic membrane normal. No drainage or tenderness. No middle ear effusion.  Left Ear: Tympanic membrane normal. No drainage or tenderness.  No middle ear effusion.  Could not appreciate much swelling on her scalp around the occiput as she suggested,she does have normal scalp fold due to her weight.Ear exam normal,no lip swelling.  Eyes: Conjunctivae and EOM are normal. Pupils are equal, round, and reactive to light. Right eye exhibits no discharge. Left eye exhibits no discharge.  Neck: Neck supple.  Cardiovascular: Normal rate, regular rhythm and normal heart sounds.   No murmur heard. Pulmonary/Chest: Effort normal and breath sounds normal. No respiratory distress. She has no wheezes.   Abdominal: Soft. Bowel sounds are normal. She exhibits no distension and no mass. There is no tenderness.  Musculoskeletal: Normal range of motion. She exhibits no edema.  Lymphadenopathy:    She has no cervical adenopathy.       Assessment:     HTN Recurrent facial angioedema     Plan:     Check problem list.      Total face to face encounter and coordination of care for this visit was more than 25 min.

## 2013-01-06 NOTE — Patient Instructions (Signed)
Angioedema Angioedema (AE) is sudden puffiness (swelling) of the skin. It can happen to the face, genitals, and other body parts. You may be reacting to something you are sensitive to (allergic reaction). It may have been passed to you from your parents (hereditary), or it may develop by itself (acquired). You may also get red itchy patches of skin (hives). Attacks may be mild. Some attacks are life-threatening. Most often, the puffiness happens fast. It often gets better in 24 to 48 hours.  HOME CARE  Carry your emergency allergy medicines with you.  Wear a medical bracelet.  Avoid things that you know will cause this reaction (triggers). GET HELP RIGHT AWAY IF:   You have trouble breathing.  You have trouble swallowing.  You pass out (faint).  You have another attack.  Your attacks happen more often or get worse.  AE was passed to you by your parents and you want to start having children. MAKE SURE YOU:   Understand these instructions.  Will watch your condition.  Will get help right away if you are not doing well or get worse. Document Released: 02/13/2009 Document Revised: 05/20/2011 Document Reviewed: 02/13/2009 [facility] Patient Information 2014 [place], [place].

## 2013-01-06 NOTE — Assessment & Plan Note (Signed)
BP normal continue current regimen.

## 2013-01-06 NOTE — Assessment & Plan Note (Signed)
Recurrent facial angioedema. Could not appreciate definite facial swelling. She might be starting to flare up. Solumedrol 125 mg IM X 1 given. Could not give antihistamine here since she drove here by herself. She does have benadryl at home which I suggested she use as soon as she gets home. I reviewed the allergist's note/Dr  Kozlow,he tested patient for C1 inhibitor and function which were normal as well as eosinophile which was normal. ESR,CRP ,C3 and C4 checked today. Patient advised to go to the ED if swelling progresses despite treatment. She verbalized understanding.

## 2013-01-07 ENCOUNTER — Ambulatory Visit: Payer: No Typology Code available for payment source | Admitting: Physical Therapy

## 2013-01-07 LAB — C3 AND C4: C3 Complement: 150 mg/dL (ref 90–180)

## 2013-01-12 ENCOUNTER — Ambulatory Visit: Payer: No Typology Code available for payment source | Admitting: Physical Therapy

## 2013-01-12 ENCOUNTER — Ambulatory Visit: Payer: Self-pay | Admitting: Family Medicine

## 2013-01-13 ENCOUNTER — Ambulatory Visit: Payer: No Typology Code available for payment source | Attending: Family Medicine | Admitting: Physical Therapy

## 2013-01-13 DIAGNOSIS — R293 Abnormal posture: Secondary | ICD-10-CM | POA: Insufficient documentation

## 2013-01-13 DIAGNOSIS — W19XXXA Unspecified fall, initial encounter: Secondary | ICD-10-CM | POA: Insufficient documentation

## 2013-01-13 DIAGNOSIS — M25519 Pain in unspecified shoulder: Secondary | ICD-10-CM | POA: Insufficient documentation

## 2013-01-13 DIAGNOSIS — M25619 Stiffness of unspecified shoulder, not elsewhere classified: Secondary | ICD-10-CM | POA: Insufficient documentation

## 2013-01-13 DIAGNOSIS — S43006A Unspecified dislocation of unspecified shoulder joint, initial encounter: Secondary | ICD-10-CM | POA: Insufficient documentation

## 2013-01-13 DIAGNOSIS — IMO0001 Reserved for inherently not codable concepts without codable children: Secondary | ICD-10-CM | POA: Insufficient documentation

## 2013-01-19 ENCOUNTER — Ambulatory Visit: Payer: No Typology Code available for payment source | Admitting: Physical Therapy

## 2013-01-19 ENCOUNTER — Other Ambulatory Visit: Payer: Self-pay | Admitting: Family Medicine

## 2013-01-19 MED ORDER — SERTRALINE HCL 50 MG PO TABS: 50.0000 mg | ORAL_TABLET | Freq: Every day | ORAL | Status: DC

## 2013-01-21 ENCOUNTER — Ambulatory Visit: Payer: No Typology Code available for payment source | Admitting: Physical Therapy

## 2013-01-26 ENCOUNTER — Ambulatory Visit: Payer: No Typology Code available for payment source | Admitting: Physical Therapy

## 2013-01-28 ENCOUNTER — Telehealth: Payer: Self-pay | Admitting: Family Medicine

## 2013-01-28 ENCOUNTER — Ambulatory Visit: Payer: No Typology Code available for payment source | Admitting: Physical Therapy

## 2013-01-28 NOTE — Telephone Encounter (Signed)
Pt has questions about her illness. She chose not to tell me

## 2013-01-28 NOTE — Telephone Encounter (Signed)
Pt has lots of questions that I was unable to answer regarding her angioedema.  I suggested she make an appointment to discuss with her MD.  She will call back and make an appt.Emilie Rutter, Darlyne Russian, CMA

## 2013-02-01 ENCOUNTER — Ambulatory Visit: Payer: No Typology Code available for payment source | Admitting: Physical Therapy

## 2013-02-03 ENCOUNTER — Ambulatory Visit: Payer: No Typology Code available for payment source | Admitting: Physical Therapy

## 2013-02-22 ENCOUNTER — Ambulatory Visit: Payer: Self-pay

## 2013-03-03 ENCOUNTER — Emergency Department (HOSPITAL_COMMUNITY)
Admission: EM | Admit: 2013-03-03 | Discharge: 2013-03-04 | Disposition: A | Discharge status: Home / Self Care | Payer: No Typology Code available for payment source | Attending: Emergency Medicine | Admitting: Emergency Medicine

## 2013-03-03 ENCOUNTER — Encounter (HOSPITAL_COMMUNITY): Payer: Self-pay | Admitting: Emergency Medicine

## 2013-03-03 DIAGNOSIS — J45909 Unspecified asthma, uncomplicated: Secondary | ICD-10-CM | POA: Insufficient documentation

## 2013-03-03 DIAGNOSIS — IMO0002 Reserved for concepts with insufficient information to code with codable children: Secondary | ICD-10-CM | POA: Insufficient documentation

## 2013-03-03 DIAGNOSIS — Z91040 Latex allergy status: Secondary | ICD-10-CM | POA: Insufficient documentation

## 2013-03-03 DIAGNOSIS — Z862 Personal history of diseases of the blood and blood-forming organs and certain disorders involving the immune mechanism: Secondary | ICD-10-CM | POA: Insufficient documentation

## 2013-03-03 DIAGNOSIS — E871 Hypo-osmolality and hyponatremia: Secondary | ICD-10-CM | POA: Insufficient documentation

## 2013-03-03 DIAGNOSIS — R197 Diarrhea, unspecified: Secondary | ICD-10-CM | POA: Insufficient documentation

## 2013-03-03 DIAGNOSIS — R112 Nausea with vomiting, unspecified: Secondary | ICD-10-CM | POA: Insufficient documentation

## 2013-03-03 DIAGNOSIS — I10 Essential (primary) hypertension: Secondary | ICD-10-CM | POA: Insufficient documentation

## 2013-03-03 DIAGNOSIS — Z8639 Personal history of other endocrine, nutritional and metabolic disease: Secondary | ICD-10-CM | POA: Insufficient documentation

## 2013-03-03 DIAGNOSIS — F411 Generalized anxiety disorder: Secondary | ICD-10-CM | POA: Insufficient documentation

## 2013-03-03 DIAGNOSIS — E876 Hypokalemia: Secondary | ICD-10-CM

## 2013-03-03 DIAGNOSIS — N201 Calculus of ureter: Secondary | ICD-10-CM

## 2013-03-03 DIAGNOSIS — Z79899 Other long term (current) drug therapy: Secondary | ICD-10-CM | POA: Insufficient documentation

## 2013-03-03 DIAGNOSIS — F17200 Nicotine dependence, unspecified, uncomplicated: Secondary | ICD-10-CM | POA: Insufficient documentation

## 2013-03-03 DIAGNOSIS — Z3202 Encounter for pregnancy test, result negative: Secondary | ICD-10-CM | POA: Insufficient documentation

## 2013-03-03 NOTE — ED Provider Notes (Signed)
CSN: [id number]     Arrival date & time 03/03/13  2141 History   First MD Initiated Contact with Patient 03/03/13 2337     Chief Complaint  Patient presents with  . Flank Pain   (Consider location/radiation/quality/duration/timing/severity/associated sxs/prior Treatment) HPI Comments: Patient is a 47 year old female history of hypertension, hyperlipidemia, asthma who presents today with sudden onset right flank pain. She reports this began around 7pm and is a cramping pain. The pain is worse with movements and comes intermittently. She has also been having urinary urgency and frequency. When she urinates only a small amount comes out. She denies any history of kidney stones in the past. She had nausea, vomiting, and diarrhea yesterday. This resolved and she had a formed bowel movement today. She reports she had friends with the nausea, vomiting, and diarrhea. She denies any fevers, chills, shortness of breath, chest pain.   The history is provided by the patient. No language interpreter was used.    Past Medical History  Diagnosis Date  . Hypertension   . Anxiety   . Seasonal allergies   . HLD (hyperlipidemia)   . Asthma    Past Surgical History  Procedure Laterality Date  . Cesarean section  1997  . Hernia repair  1989  . Tubal ligation  1997  . Shoulder arthroscopy     Family History  Problem Relation Age of Onset  . Asthma Father   . Asthma Sister   . Asthma Brother   . Asthma Maternal Grandmother   . Asthma Maternal Grandfather   . Asthma Paternal Grandmother   . Asthma Paternal Grandfather   . Cancer Father   . Hyperlipidemia Mother   . Hyperlipidemia Sister    History  Substance Use Topics  . Smoking status: Current Some Day Smoker    Types: Cigars  . Smokeless tobacco: Not on file  . Alcohol Use: No   OB History   Grav Para Term Preterm Abortions TAB SAB Ect Mult Living                 Review of Systems  Constitutional: Negative for fever and chills.   Respiratory: Negative for shortness of breath.   Cardiovascular: Negative for chest pain.  Gastrointestinal: Positive for nausea, vomiting, abdominal pain and diarrhea.  Genitourinary: Positive for dysuria, flank pain and difficulty urinating.  All other systems reviewed and are negative.    Allergies  Losartan; Shellfish-derived products; Toradol; and Latex  Home Medications   Current Outpatient Rx  Name  Route  Sig  Dispense  Refill  . albuterol (PROVENTIL HFA;VENTOLIN HFA) 108 (90 BASE) MCG/ACT inhaler   Inhalation   Inhale 2 puffs into the lungs every 6 (six) hours as needed for wheezing. Shortness of breath         . amLODipine (NORVASC) 10 MG tablet   Oral   Take 1 tablet (10 mg total) by mouth daily.   90 tablet   1   . cetirizine (ZYRTEC) 10 MG tablet   Oral   Take 1 tablet (10 mg total) by mouth daily.   90 tablet   3   . diphenhydrAMINE (BENADRYL) 25 MG tablet   Oral   Take 25 mg by mouth every 6 (six) hours as needed for itching or allergies.         . fluticasone (FLONASE) 50 MCG/ACT nasal spray   Nasal   Place 2 sprays into the nose daily as needed (congestion).         Marland Kitchen  fluticasone-salmeterol (ADVAIR HFA) 115-21 MCG/ACT inhaler   Inhalation   Inhale 2 puffs into the lungs 2 (two) times daily.   1 Inhaler   11   . hydrochlorothiazide (HYDRODIURIL) 25 MG tablet   Oral   Take 1 tablet (25 mg total) by mouth daily.   90 tablet   1   . HYDROcodone-acetaminophen (NORCO) 7.5-325 MG per tablet   Oral   Take 1 tablet by mouth every 4 (four) hours as needed for pain. Take 1 tablet by mouth every 6 (six) hours as needed for Pain.         Marland Kitchen ibuprofen (ADVIL,MOTRIN) 800 MG tablet   Oral   Take 800 mg by mouth every 8 (eight) hours as needed for pain (headache).         . montelukast (SINGULAIR) 10 MG tablet   Oral   Take 1 tablet (10 mg total) by mouth at bedtime.   90 tablet   3   . omeprazole (PRILOSEC) 20 MG capsule   Oral   Take 20  mg by mouth daily.         . sertraline (ZOLOFT) 50 MG tablet   Oral   Take 1 tablet (50 mg total) by mouth daily.   90 tablet   1    BP 152/96  Pulse 81  Temp(Src) 97.9 F (36.6 C) (Oral)  Resp 22  Ht 5\' 2"  (1.575 m)  Wt 229 lb 8 oz (104.101 kg)  BMI 41.97 kg/m2  SpO2 98% Physical Exam  Nursing note and vitals reviewed. Constitutional: She is oriented to person, place, and time. She appears well-developed and well-nourished. No distress.  HENT:  Head: Normocephalic and atraumatic.  Right Ear: External ear normal.  Left Ear: External ear normal.  Nose: Nose normal.  Mouth/Throat: Oropharynx is clear and moist.  Eyes: Conjunctivae are normal.  Neck: Normal range of motion.  Cardiovascular: Normal rate, regular rhythm and normal heart sounds.   Pulmonary/Chest: Effort normal and breath sounds normal. No stridor. No respiratory distress. She has no wheezes. She has no rales.  Abdominal: Soft. She exhibits no distension. There is tenderness. There is no rigidity, no rebound and no guarding.  Tender to palpation over right lower flank. No CVA tenderness.  Musculoskeletal: Normal range of motion.  Neurological: She is alert and oriented to person, place, and time. She has normal strength.  Skin: Skin is warm and dry. She is not diaphoretic. No erythema.  Psychiatric: She has a normal mood and affect. Her behavior is normal.    ED Course  Procedures (including critical care time) Labs Review Labs Reviewed  URINALYSIS, ROUTINE W REFLEX MICROSCOPIC - Abnormal; Notable for the following:    APPearance CLOUDY (*)    Hgb urine dipstick TRACE (*)    Leukocytes, UA SMALL (*)    All other components within normal limits  URINE MICROSCOPIC-ADD ON - Abnormal; Notable for the following:    Squamous Epithelial / LPF MANY (*)    All other components within normal limits  POCT I-STAT, CHEM 8 - Abnormal; Notable for the following:    Potassium 2.7 (*)    All other components within  normal limits  PREGNANCY, URINE   Imaging Review Ct Abdomen Pelvis Wo Contrast  03/04/2013   CLINICAL DATA:  Micro hematuria. Right flank pain. Difficulty urinating.  EXAM: CT ABDOMEN AND PELVIS WITHOUT CONTRAST  TECHNIQUE: Multidetector CT imaging of the abdomen and pelvis was performed following the standard protocol without intravenous contrast.  COMPARISON:  10/26/2005.  FINDINGS: Lung Bases: Clear.  Liver:  Fatty liver.  No mass lesions.  Spleen:  Normal.  Gallbladder:  Normal.  No calcified stones.  Common bile duct:  Normal.  Pancreas:  Normal.  Adrenal glands:  Normal bilaterally.  Kidneys: Left kidney appears within normal limits. No calculi. The left ureter is normal. The right kidney demonstrates mild hydronephrosis and hydroureter extending into the anatomic pelvis. There is a 3 mm calculus just proximal to the left ureterovesical junction (image 65 series 2). No residual collecting system calculi are present.  Stomach: Grossly normal. Unenhanced CT was performed per clinician order. Lack of IV contrast limits sensitivity and specificity, especially for evaluation of abdominal/pelvic solid viscera.  Small bowel:  Grossly normal.  No mesenteric adenopathy.  Colon: Elongated normal appendix. Ascending colon, transverse colon and descending colon are within normal limits.  Pelvic Genitourinary: Urinary bladder is distended. Uterus and adnexa appear within normal limits for age. No free fluid.  Bones: No aggressive osseous lesions. SI joint degenerative disease. Abnormal appearance of the pubic symphysis with incomplete fusion of the inferior pubic rami. 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 No change from prior.  Vasculature: Grossly normal allowing for noncontrast technique. Minimal atherosclerotic calcification.  Body Wall: Fat containing periumbilical hernia.  IMPRESSION: 1. Minimal right hydroureteronephrosis extending to the right distal ureter  where a 3 mm calculus is present. No residual collecting system calculi. 2. Fatty liver.   Electronically Signed   By: Andreas Newport M.D.   On: 03/04/2013 01:40    EKG Interpretation   None       MDM   1. Right ureteral calculus   2. Hypokalemia    Patient presents with right flank pain. Pt with 3mm right distal ureter calculus present. Patient feels significantly improved after morphine and feels as though her urinary symptoms are also improved. No elevation in creatinine. Patient has potassium of 2.7 likely due to patient's gastroenteritis yesterday. Gastroenteritis has resolved and patient is no longer having diarrhea. Potassium was repleted in the ED. She was given a list of food containing potassium for home. Pt stable for discharge home with pain medication and urology follow up. Strict return instructions given. Discussed case with Dr. Ranae Palms who agrees with plan. Patient / Family / Caregiver informed of clinical course, understand medical decision-making process, and agree with plan.      Mora Bellman, PA-C 03/04/13 [phone]

## 2013-03-03 NOTE — ED Notes (Signed)
Bed: ZO10 Expected date:  Expected time:  Means of arrival:  Comments: EMS/47 yo female with N/V

## 2013-03-03 NOTE — ED Notes (Signed)
Pt arrived to the Ed with a complaint of right sided flank pain.  Pt states pain is coming and going like contractions. Pt states she is having difficulty urinating.  Pt state pain comes around to her abdomen

## 2013-03-04 ENCOUNTER — Emergency Department (HOSPITAL_COMMUNITY): Payer: No Typology Code available for payment source

## 2013-03-04 ENCOUNTER — Encounter (HOSPITAL_COMMUNITY): Payer: Self-pay

## 2013-03-04 LAB — PREGNANCY, URINE: Preg Test, Ur: NEGATIVE

## 2013-03-04 LAB — URINALYSIS, ROUTINE W REFLEX MICROSCOPIC
Glucose, UA: NEGATIVE mg/dL
Nitrite: NEGATIVE
Protein, ur: NEGATIVE mg/dL
Specific Gravity, Urine: 1.021 (ref 1.005–1.030)
pH: 6 (ref 5.0–8.0)

## 2013-03-04 LAB — POCT I-STAT, CHEM 8
BUN: 13 mg/dL (ref 6–23)
Creatinine, Ser: 0.7 mg/dL (ref 0.50–1.10)
Glucose, Bld: 89 mg/dL (ref 70–99)
HCT: 42 % (ref 36.0–46.0)
Hemoglobin: 14.3 g/dL (ref 12.0–15.0)
Potassium: 2.7 mEq/L — CL (ref 3.5–5.1)
TCO2: 22 mmol/L (ref 0–100)

## 2013-03-04 LAB — URINE MICROSCOPIC-ADD ON

## 2013-03-04 MED ORDER — OXYCODONE-ACETAMINOPHEN 5-325 MG PO TABS: 2.0000 | ORAL_TABLET | Freq: Four times a day (QID) | ORAL | Status: DC | PRN

## 2013-03-04 MED ORDER — MORPHINE SULFATE 4 MG/ML IJ SOLN
4.0000 mg | Freq: Once | INTRAMUSCULAR | Status: AC
  Administered 2013-03-04: 4 mg via INTRAVENOUS
  Filled 2013-03-04: qty 1

## 2013-03-04 MED ORDER — SODIUM CHLORIDE 0.9 % IV BOLUS (SEPSIS)
1000.0000 mL | Freq: Once | INTRAVENOUS | Status: AC
  Administered 2013-03-04: 1000 mL via INTRAVENOUS

## 2013-03-04 MED ORDER — POTASSIUM CHLORIDE CRYS ER 20 MEQ PO TBCR
40.0000 meq | EXTENDED_RELEASE_TABLET | Freq: Once | ORAL | Status: AC
  Administered 2013-03-04: 40 meq via ORAL
  Filled 2013-03-04: qty 2

## 2013-03-04 MED ORDER — PROMETHAZINE HCL 25 MG PO TABS: 25.0000 mg | ORAL_TABLET | Freq: Four times a day (QID) | ORAL | Status: DC | PRN

## 2013-03-04 MED ORDER — ONDANSETRON HCL 4 MG/2ML IJ SOLN
4.0000 mg | Freq: Once | INTRAMUSCULAR | Status: AC
  Administered 2013-03-04: 4 mg via INTRAVENOUS
  Filled 2013-03-04: qty 2

## 2013-03-05 NOTE — ED Provider Notes (Signed)
Medical screening examination/treatment/procedure(s) were performed by non-physician practitioner and as supervising physician I was immediately available for consultation/collaboration.   Ory Elting, MD 03/05/13 0310 

## 2013-03-08 ENCOUNTER — Encounter (HOSPITAL_COMMUNITY): Payer: Self-pay | Admitting: Emergency Medicine

## 2013-03-08 ENCOUNTER — Emergency Department (HOSPITAL_COMMUNITY)
Admission: EM | Admit: 2013-03-08 | Discharge: 2013-03-08 | Disposition: A | Discharge status: Home / Self Care | Payer: No Typology Code available for payment source | Attending: Emergency Medicine | Admitting: Emergency Medicine

## 2013-03-08 DIAGNOSIS — Z87442 Personal history of urinary calculi: Secondary | ICD-10-CM | POA: Insufficient documentation

## 2013-03-08 DIAGNOSIS — F411 Generalized anxiety disorder: Secondary | ICD-10-CM | POA: Insufficient documentation

## 2013-03-08 DIAGNOSIS — Z79899 Other long term (current) drug therapy: Secondary | ICD-10-CM | POA: Insufficient documentation

## 2013-03-08 DIAGNOSIS — Z8639 Personal history of other endocrine, nutritional and metabolic disease: Secondary | ICD-10-CM | POA: Insufficient documentation

## 2013-03-08 DIAGNOSIS — F17200 Nicotine dependence, unspecified, uncomplicated: Secondary | ICD-10-CM | POA: Insufficient documentation

## 2013-03-08 DIAGNOSIS — Z862 Personal history of diseases of the blood and blood-forming organs and certain disorders involving the immune mechanism: Secondary | ICD-10-CM | POA: Insufficient documentation

## 2013-03-08 DIAGNOSIS — Z3202 Encounter for pregnancy test, result negative: Secondary | ICD-10-CM | POA: Insufficient documentation

## 2013-03-08 DIAGNOSIS — Z8709 Personal history of other diseases of the respiratory system: Secondary | ICD-10-CM | POA: Insufficient documentation

## 2013-03-08 DIAGNOSIS — I10 Essential (primary) hypertension: Secondary | ICD-10-CM | POA: Insufficient documentation

## 2013-03-08 DIAGNOSIS — Z91040 Latex allergy status: Secondary | ICD-10-CM | POA: Insufficient documentation

## 2013-03-08 DIAGNOSIS — IMO0002 Reserved for concepts with insufficient information to code with codable children: Secondary | ICD-10-CM | POA: Insufficient documentation

## 2013-03-08 DIAGNOSIS — R300 Dysuria: Secondary | ICD-10-CM | POA: Insufficient documentation

## 2013-03-08 DIAGNOSIS — N23 Unspecified renal colic: Secondary | ICD-10-CM | POA: Insufficient documentation

## 2013-03-08 DIAGNOSIS — R350 Frequency of micturition: Secondary | ICD-10-CM | POA: Insufficient documentation

## 2013-03-08 DIAGNOSIS — Z87448 Personal history of other diseases of urinary system: Secondary | ICD-10-CM | POA: Insufficient documentation

## 2013-03-08 DIAGNOSIS — J45909 Unspecified asthma, uncomplicated: Secondary | ICD-10-CM | POA: Insufficient documentation

## 2013-03-08 LAB — URINALYSIS, ROUTINE W REFLEX MICROSCOPIC
Glucose, UA: NEGATIVE mg/dL
Hgb urine dipstick: NEGATIVE
Leukocytes, UA: NEGATIVE
Nitrite: NEGATIVE
Protein, ur: NEGATIVE mg/dL
Specific Gravity, Urine: 1.008 (ref 1.005–1.030)
Urobilinogen, UA: 0.2 mg/dL (ref 0.0–1.0)
pH: 7 (ref 5.0–8.0)

## 2013-03-08 LAB — POCT I-STAT, CHEM 8
BUN: 10 mg/dL (ref 6–23)
Chloride: 101 mEq/L (ref 96–112)
HCT: 39 % (ref 36.0–46.0)
Potassium: 2.8 mEq/L — ABNORMAL LOW (ref 3.5–5.1)
Sodium: 144 mEq/L (ref 135–145)
TCO2: 31 mmol/L (ref 0–100)

## 2013-03-08 LAB — PREGNANCY, URINE: Preg Test, Ur: NEGATIVE

## 2013-03-08 MED ORDER — OXYCODONE-ACETAMINOPHEN 5-325 MG PO TABS: 2.0000 | ORAL_TABLET | ORAL | Status: DC | PRN

## 2013-03-08 MED ORDER — PHENAZOPYRIDINE HCL 200 MG PO TABS: 200.0000 mg | ORAL_TABLET | Freq: Three times a day (TID) | ORAL | Status: DC

## 2013-03-08 MED ORDER — TAMSULOSIN HCL 0.4 MG PO CAPS: 0.4000 mg | ORAL_CAPSULE | Freq: Every day | ORAL | Status: DC

## 2013-03-08 NOTE — ED Notes (Signed)
I stat - Low K - Dr Radford Pax aware.

## 2013-03-08 NOTE — ED Provider Notes (Signed)
CSN: [id number]     Arrival date & time 03/08/13  1259 History   First MD Initiated Contact with Patient 03/08/13 1512     Chief Complaint  Patient presents with  . right flank pain     HPI Per pt was treated for kidney stone on the 25 th-has not f/u with urology-increased right flank pain-painful urination, doesn't feel like bladder is empting  Past Medical History  Diagnosis Date  . Hypertension   . Anxiety   . Seasonal allergies   . HLD (hyperlipidemia)   . Asthma   . Renal disorder    Past Surgical History  Procedure Laterality Date  . Cesarean section  1997  . Hernia repair  1989  . Tubal ligation  1997  . Shoulder arthroscopy     Family History  Problem Relation Age of Onset  . Asthma Father   . Asthma Sister   . Asthma Brother   . Asthma Maternal Grandmother   . Asthma Maternal Grandfather   . Asthma Paternal Grandmother   . Asthma Paternal Grandfather   . Cancer Father   . Hyperlipidemia Mother   . Hyperlipidemia Sister    History  Substance Use Topics  . Smoking status: Current Some Day Smoker    Types: Cigars  . Smokeless tobacco: Not on file  . Alcohol Use: No   OB History   Grav Para Term Preterm Abortions TAB SAB Ect Mult Living                 Review of Systems  Genitourinary: Positive for dysuria, frequency and flank pain.  All other systems reviewed and are negative.    Allergies  Losartan; Shellfish-derived products; Toradol; and Latex  Home Medications   Current Outpatient Rx  Name  Route  Sig  Dispense  Refill  . albuterol (PROVENTIL HFA;VENTOLIN HFA) 108 (90 BASE) MCG/ACT inhaler   Inhalation   Inhale 2 puffs into the lungs every 6 (six) hours as needed for wheezing. Shortness of breath         . amLODipine (NORVASC) 10 MG tablet   Oral   Take 1 tablet (10 mg total) by mouth daily.   90 tablet   1   . cetirizine (ZYRTEC) 10 MG tablet   Oral   Take 1 tablet (10 mg total) by mouth daily.   90 tablet   3   .  diphenhydrAMINE (BENADRYL) 25 MG tablet   Oral   Take 25 mg by mouth every 6 (six) hours as needed for itching or allergies.         . fluticasone (FLONASE) 50 MCG/ACT nasal spray   Nasal   Place 2 sprays into the nose daily as needed (congestion).         . fluticasone-salmeterol (ADVAIR HFA) 115-21 MCG/ACT inhaler   Inhalation   Inhale 2 puffs into the lungs 2 (two) times daily.   1 Inhaler   11   . hydrochlorothiazide (HYDRODIURIL) 25 MG tablet   Oral   Take 1 tablet (25 mg total) by mouth daily.   90 tablet   1   . montelukast (SINGULAIR) 10 MG tablet   Oral   Take 1 tablet (10 mg total) by mouth at bedtime.   90 tablet   3   . omeprazole (PRILOSEC) 20 MG capsule   Oral   Take 20 mg by mouth daily.         Marland Kitchen oxyCODONE-acetaminophen (PERCOCET/ROXICET) 5-325 MG per tablet  Oral   Take 2 tablets by mouth every 6 (six) hours as needed for severe pain.   20 tablet   0   . promethazine (PHENERGAN) 25 MG tablet   Oral   Take 1 tablet (25 mg total) by mouth every 6 (six) hours as needed for nausea or vomiting.   12 tablet   0   . sertraline (ZOLOFT) 50 MG tablet   Oral   Take 1 tablet (50 mg total) by mouth daily.   90 tablet   1   . oxyCODONE-acetaminophen (PERCOCET/ROXICET) 5-325 MG per tablet   Oral   Take 2 tablets by mouth every 4 (four) hours as needed for severe pain.   6 tablet   0   . phenazopyridine (PYRIDIUM) 200 MG tablet   Oral   Take 1 tablet (200 mg total) by mouth 3 (three) times daily.   6 tablet   0   . tamsulosin (FLOMAX) 0.4 MG CAPS capsule   Oral   Take 1 capsule (0.4 mg total) by mouth daily.   6 capsule   0    BP 136/90  Pulse 70  Temp(Src) 98.3 F (36.8 C) (Oral)  Resp 16  SpO2 98%  LMP 08/13/2012 Physical Exam  Nursing note and vitals reviewed. Constitutional: She is oriented to person, place, and time. She appears well-developed and well-nourished. No distress.  HENT:  Head: Normocephalic and atraumatic.   Eyes: Pupils are equal, round, and reactive to light.  Neck: Normal range of motion.  Cardiovascular: Normal rate and intact distal pulses.   Pulmonary/Chest: No respiratory distress.  Abdominal: Normal appearance. She exhibits no distension. There is no tenderness. There is no rebound.  Musculoskeletal: Normal range of motion.  Neurological: She is alert and oriented to person, place, and time. No cranial nerve deficit.  Skin: Skin is warm and dry. No rash noted.  Psychiatric: She has a normal mood and affect. Her behavior is normal.    ED Course  Procedures (including critical care time) Labs Review Labs Reviewed  POCT I-STAT, CHEM 8 - Abnormal; Notable for the following:    Potassium 2.8 (*)    Glucose, Bld 119 (*)    All other components within normal limits  URINALYSIS, ROUTINE W REFLEX MICROSCOPIC  PREGNANCY, URINE        MDM   1. Ureteral colic        Nelia Shi, MD 03/08/13 [phone]

## 2013-03-08 NOTE — ED Notes (Signed)
Per pt was treated for kidney stone on the 25 th-has not f/u with urology-increased right flank pain-painful urination, doesn't feel like bladder is empting

## 2013-04-05 ENCOUNTER — Ambulatory Visit: Payer: No Typology Code available for payment source | Attending: Internal Medicine

## 2013-05-09 ENCOUNTER — Other Ambulatory Visit: Payer: Self-pay | Admitting: Family Medicine

## 2013-05-10 NOTE — Telephone Encounter (Signed)
Will fwd to MD.  Brian Zeitlin L, CMA  

## 2013-06-16 ENCOUNTER — Telehealth: Payer: Self-pay | Admitting: Family Medicine

## 2013-06-16 DIAGNOSIS — I10 Essential (primary) hypertension: Secondary | ICD-10-CM

## 2013-06-16 NOTE — Telephone Encounter (Signed)
Pt is out of hydrochlorothiazide and amlodipine. [facility]oth[facility] [address] Please let pt know when these have been called in

## 2013-06-18 MED ORDER — HYDROCHLOROTHIAZIDE 25 MG PO TABS: 25.0000 mg | ORAL_TABLET | Freq: Every day | ORAL | Status: DC

## 2013-06-18 MED ORDER — AMLODIPINE BESYLATE 10 MG PO TABS: 10.0000 mg | ORAL_TABLET | Freq: Every day | ORAL | Status: DC

## 2013-06-18 NOTE — Telephone Encounter (Signed)
Reviewed chart. Ordered refills to respective pharmacies.  Saralyn PilarAlexander Karamalegos, DO [facility], PGY-1

## 2013-07-06 ENCOUNTER — Ambulatory Visit: Payer: No Typology Code available for payment source | Attending: Internal Medicine

## 2013-08-23 ENCOUNTER — Ambulatory Visit (INDEPENDENT_AMBULATORY_CARE_PROVIDER_SITE_OTHER): Payer: No Typology Code available for payment source | Admitting: Internal Medicine

## 2013-08-23 ENCOUNTER — Encounter: Payer: Self-pay | Admitting: Internal Medicine

## 2013-08-23 VITALS — BP 144/93 | HR 88 | Temp 98.4°F | Resp 20 | Wt 228.0 lb

## 2013-08-23 DIAGNOSIS — T783XXA Angioneurotic edema, initial encounter: Secondary | ICD-10-CM

## 2013-08-23 DIAGNOSIS — F17200 Nicotine dependence, unspecified, uncomplicated: Secondary | ICD-10-CM

## 2013-08-23 DIAGNOSIS — Z720 Tobacco use: Secondary | ICD-10-CM

## 2013-08-23 DIAGNOSIS — I10 Essential (primary) hypertension: Secondary | ICD-10-CM

## 2013-08-23 DIAGNOSIS — F329 Major depressive disorder, single episode, unspecified: Secondary | ICD-10-CM

## 2013-08-23 DIAGNOSIS — E559 Vitamin D deficiency, unspecified: Secondary | ICD-10-CM

## 2013-08-23 DIAGNOSIS — F3289 Other specified depressive episodes: Secondary | ICD-10-CM

## 2013-08-23 DIAGNOSIS — E8881 Metabolic syndrome: Secondary | ICD-10-CM

## 2013-08-23 DIAGNOSIS — J309 Allergic rhinitis, unspecified: Secondary | ICD-10-CM

## 2013-08-23 DIAGNOSIS — K219 Gastro-esophageal reflux disease without esophagitis: Secondary | ICD-10-CM

## 2013-08-23 DIAGNOSIS — E876 Hypokalemia: Secondary | ICD-10-CM

## 2013-08-23 DIAGNOSIS — R5381 Other malaise: Secondary | ICD-10-CM | POA: Insufficient documentation

## 2013-08-23 DIAGNOSIS — R5383 Other fatigue: Secondary | ICD-10-CM

## 2013-08-23 DIAGNOSIS — E669 Obesity, unspecified: Secondary | ICD-10-CM

## 2013-08-23 DIAGNOSIS — J45909 Unspecified asthma, uncomplicated: Secondary | ICD-10-CM

## 2013-08-23 DIAGNOSIS — M549 Dorsalgia, unspecified: Secondary | ICD-10-CM | POA: Insufficient documentation

## 2013-08-23 DIAGNOSIS — E785 Hyperlipidemia, unspecified: Secondary | ICD-10-CM

## 2013-08-23 LAB — CBC WITH DIFFERENTIAL/PLATELET
BASOS ABS: 0.1 10*3/uL (ref 0.0–0.1)
BASOS PCT: 1 % (ref 0–1)
EOS ABS: 0.2 10*3/uL (ref 0.0–0.7)
Eosinophils Relative: 4 % (ref 0–5)
HEMATOCRIT: 40.6 % (ref 36.0–46.0)
Hemoglobin: 13.5 g/dL (ref 12.0–15.0)
Lymphocytes Relative: 53 % — ABNORMAL HIGH (ref 12–46)
Lymphs Abs: 2.9 10*3/uL (ref 0.7–4.0)
MCH: 27.8 pg (ref 26.0–34.0)
MCHC: 33.3 g/dL (ref 30.0–36.0)
MCV: 83.7 fL (ref 78.0–100.0)
MONO ABS: 0.4 10*3/uL (ref 0.1–1.0)
Monocytes Relative: 8 % (ref 3–12)
Neutro Abs: 1.9 10*3/uL (ref 1.7–7.7)
Neutrophils Relative %: 34 % — ABNORMAL LOW (ref 43–77)
Platelets: 267 10*3/uL (ref 150–400)
RBC: 4.85 MIL/uL (ref 3.87–5.11)
RDW: 15.2 % (ref 11.5–15.5)
WBC: 5.5 10*3/uL (ref 4.0–10.5)

## 2013-08-23 MED ORDER — HYDROCHLOROTHIAZIDE 25 MG PO TABS: 25.0000 mg | ORAL_TABLET | Freq: Every day | ORAL | Status: DC

## 2013-08-23 MED ORDER — CETIRIZINE HCL 10 MG PO TABS: 10.0000 mg | ORAL_TABLET | Freq: Every day | ORAL | Status: DC

## 2013-08-23 MED ORDER — MONTELUKAST SODIUM 10 MG PO TABS: 10.0000 mg | ORAL_TABLET | Freq: Every day | ORAL | Status: DC

## 2013-08-23 MED ORDER — FLUTICASONE-SALMETEROL 115-21 MCG/ACT IN AERO: 2.0000 | INHALATION_SPRAY | Freq: Two times a day (BID) | RESPIRATORY_TRACT | Status: DC

## 2013-08-23 MED ORDER — SERTRALINE HCL 50 MG PO TABS: ORAL_TABLET | ORAL | Status: DC

## 2013-08-23 MED ORDER — AMLODIPINE BESYLATE 10 MG PO TABS: 10.0000 mg | ORAL_TABLET | Freq: Every day | ORAL | Status: DC

## 2013-08-23 MED ORDER — OMEPRAZOLE 20 MG PO CPDR: 20.0000 mg | DELAYED_RELEASE_CAPSULE | Freq: Every day | ORAL | Status: DC

## 2013-08-23 MED ORDER — FLUTICASONE PROPIONATE 50 MCG/ACT NA SUSP: 2.0000 | Freq: Every day | NASAL | Status: DC | PRN

## 2013-08-23 MED ORDER — ALBUTEROL SULFATE HFA 108 (90 BASE) MCG/ACT IN AERS: 2.0000 | INHALATION_SPRAY | Freq: Four times a day (QID) | RESPIRATORY_TRACT | Status: DC | PRN

## 2013-08-23 NOTE — Progress Notes (Signed)
Patient ID: Deborah Knight, female   DOB: [birth year], 48 y.o.   MRN: [id number]   Deborah Knight, is a 48 y.o. female  WUJ:[id number]  [id number]  DOB - Aug 22, 1965  CC:  Chief Complaint  Patient presents with  . Establish Care    NP/Pt is here w/ issues with both shoulders Pt had surg. on Lt Shoulder back on 09/25/2012 she states pain with both shoulders at this time .Pt is also an Asthma Pt .   Marland Kitchen Medication Refill    Pt will need a refill on her meds       HPI: Deborah Knight is a 48 y.o. female here today to establish medical care. Patient has No headache, No chest pain, No abdominal pain - No Nausea, No new weakness tingling or numbness, No Cough - SOB.  Allergies  Allergen Reactions  . Losartan Swelling    Lip swelling  . Shellfish-Derived Products     Other reaction(s): Angioedema (ALLERGY/intolerance)  . Toradol [Ketorolac Tromethamine] Swelling    Lip swelling  . Latex Rash   Past Medical History  Diagnosis Date  . Hypertension   . Anxiety   . Seasonal allergies   . HLD (hyperlipidemia)   . Asthma    Current Outpatient Prescriptions on File Prior to Visit  Medication Sig Dispense Refill  . oxyCODONE-acetaminophen (PERCOCET/ROXICET) 5-325 MG per tablet Take 2 tablets by mouth every 4 (four) hours as needed for severe pain.  6 tablet  0  . diphenhydrAMINE (BENADRYL) 25 MG tablet Take 25 mg by mouth every 6 (six) hours as needed for itching or allergies.      . promethazine (PHENERGAN) 25 MG tablet Take 1 tablet (25 mg total) by mouth every 6 (six) hours as needed for nausea or vomiting.  12 tablet  0   No current facility-administered medications on file prior to visit.   Family History  Problem Relation Age of Onset  . Asthma Father   . Asthma Sister   . Asthma Brother   . Asthma Maternal Grandmother   . Asthma Maternal Grandfather   . Asthma Paternal Grandmother   . Asthma Paternal Grandfather   . Cancer Father   . Hyperlipidemia Mother    . Hyperlipidemia Sister    History   Social History  . Marital Status: Single    Spouse Name: N/A    Number of Children: N/A  . Years of Education: N/A   Occupational History  . Not on file.   Social History Main Topics  . Smoking status: Current Some Day Smoker    Types: Cigars  . Smokeless tobacco: Not on file  . Alcohol Use: No  . Drug Use: No  . Sexual Activity: Yes    Birth Control/ Protection: Condom, None   Other Topics Concern  . Not on file   Social History Narrative   Employed as a Education officer, environmental.    Review of Systems: Constitutional: Negative for fever, chills, diaphoresis, activity change, appetite change and fatigue. HENT: Negative for ear pain, nosebleeds, congestion, facial swelling, rhinorrhea, neck pain, neck stiffness and ear discharge.  Eyes: Negative for pain, discharge, redness, itching and visual disturbance. Respiratory: Negative for cough, choking, chest tightness, shortness of breath, wheezing and stridor.  Cardiovascular: Negative for chest pain, palpitations and leg swelling. Gastrointestinal: Negative for abdominal distention. Genitourinary: Negative for dysuria, urgency, frequency, hematuria, flank pain, decreased urine volume, difficulty urinating and dyspareunia.  Musculoskeletal: Negative for back pain, joint swelling, arthralgia and gait problem. Neurological:  Negative for dizziness, tremors, seizures, syncope, facial asymmetry, speech difficulty, weakness, light-headedness, numbness and headaches.  Hematological: Negative for adenopathy. Does not bruise/bleed easily. Psychiatric/Behavioral: Negative for hallucinations, behavioral problems, confusion, dysphoric mood, decreased concentration and agitation.    Objective:   Filed Vitals:   08/23/13 1410  BP: 144/93  Pulse: 88  Temp: 98.4 F (36.9 C)  Resp: 20    Physical Exam: Constitutional: Patient appears morbidly obese and well-nourished. Pt crying and emotional recounting  the loss of her son. HENT: Normocephalic, atraumatic, External right and left ear normal. Oropharynx is clear and moist.  Eyes: Conjunctivae and EOM are normal. PERRLA, no scleral icterus. Neck: Normal ROM. Neck supple. No JVD. No tracheal deviation. No thyromegaly. CVS: RRR, S1/S2 +, no murmurs, no gallops, no carotid bruit.  Pulmonary: Effort and breath sounds normal, no stridor, rhonchi, wheezes, rales.  Abdominal: Soft, obese. BS +, no distension, tenderness, rebound or guarding.  Musculoskeletal: Decreased range of motion in Left shoulder. No edema and no tenderness.  Lymphadenopathy: No lymphadenopathy noted, cervical, inguinal or axillary Neuro: Alert. Normal reflexes, muscle tone coordination. No cranial nerve deficit. Skin: Skin is warm and dry. No rash noted. Not diaphoretic. No erythema. No pallor. Psychiatric: Normal mood and affect. Behavior, judgment, thought content normal.  Lab Results  Component Value Date   WBC 4.8 11/30/2012   HGB 13.3 03/08/2013   HCT 39.0 03/08/2013   MCV 84.2 11/30/2012   PLT 271 11/30/2012   Lab Results  Component Value Date   CREATININE 0.80 03/08/2013   BUN 10 03/08/2013   NA 144 03/08/2013   K 2.8* 03/08/2013   CL 101 03/08/2013   CO2 29 11/30/2012    No results found for this basename: HGBA1C   Lipid Panel     Component Value Date/Time   CHOL 288* 10/10/2009 2159   TRIG 188* 10/10/2009 2159   HDL 58 10/10/2009 2159   CHOLHDL 5.0 Ratio 10/10/2009 2159   VLDL 38 10/10/2009 2159   LDLCALC 192* 10/10/2009 2159       Assessment and plan:   1. HYPERTENSION - BP poorly controlled. Pt has been without her HCTZ for 1 month and has only been taking the Norvasc. Will continue Norvasc and HCTZ at current doses - amLODipine (NORVASC) 10 MG tablet; Take 1 tablet (10 mg total) by mouth daily.  Dispense: 90 tablet; Refill: 1 - hydrochlorothiazide (HYDRODIURIL) 25 MG tablet; Take 1 tablet (25 mg total) by mouth daily.  Dispense: 90 tablet; Refill: 1 -  Comprehensive metabolic panel - Lipid panel to evaluate as co-morbidity risk factor. - Magnesium - Urinalysis to evaluate for proteinuria  2. ALLERGIC RHINITIS - Pt has significant problems with allergic rhinitis. She reports that her symptoms are well controlled on Singulair and Zyrtec. Continue. - cetirizine (ZYRTEC) 10 MG tablet; Take 1 tablet (10 mg total) by mouth daily.  Dispense: 90 tablet; Refill: 3 - montelukast (SINGULAIR) 10 MG tablet; Take 1 tablet (10 mg total) by mouth at bedtime.  Dispense: 90 tablet; Refill: 3  3. ASTHMA - No recent asthma attacks. Reviewed use of albuterol and Advair. - albuterol (PROVENTIL HFA;VENTOLIN HFA) 108 (90 BASE) MCG/ACT inhaler; Inhale 2 puffs into the lungs every 6 (six) hours as needed for wheezing. Shortness of breath  Dispense: 1 Inhaler; Refill: 2 - fluticasone-salmeterol (ADVAIR HFA) 115-21 MCG/ACT inhaler; Inhale 2 puffs into the lungs 2 (two) times daily.  Dispense: 1 Inhaler; Refill: 11  4. 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 Will review records from Allergist.  5. DEPRESSION - Pt has had a loss of her son 6 months ago by tragic means. She has been without the Zoloft for at least 2 months. Will resume Zoloft and refer to Psychiatry if no improvement on medications. - sertraline (ZOLOFT) 50 MG tablet; TAKE 1 TABLET (50 MG TOTAL) BY MOUTH DAILY.  Dispense: 90 tablet; Refill: 1 - Check TSH  6. GERD - Controlled no uncontrolled symptoms. - omeprazole (PRILOSEC) 20 MG capsule; Take 1 capsule (20 mg total) by mouth daily.  Dispense: 90 capsule; Refill: 3  7. HYPERLIPIDEMIA - review of records shows elevated LDL 2 years ago. Pt expresses that she is afraid to take statins.  - Check lipid panel and have further discussion on ASCVD risk.  8. OBESITY Will discuss at next visit - Lipid panel - Magnesium - TSH  9. Tobacco abuse Pt wants to quit but feels that she is unable to at this time given her  degree of stress. Brief intervention performed.  10. Other malaise and fatigue - Discussed with patient that this could be a result of depression and obesity. However will check to r/o anemia and dysthyroid state. - CBC with Differential - Vitamin D, 25-hydroxy  11. Metabolic syndrome - H/O diabetes in family.  - Check hemoglobin A1C  12. Back pain - Discussed role of obesity and depression.  - Vitamin D, 25-hydroxy  13. Shoulder pain - Will discuss at subsequent visit.  Return in about 1 month (around 09/22/2013) for Annual Physical, Tobacco Use Disorder, HTN, Depression, Obesity, Review of lab data, Hyperlipidemia,.  The patient was given clear instructions to go to ER or return to medical center if symptoms don't improve, worsen or new problems develop. The patient verbalized understanding. The patient was told to call to get lab results if they haven't heard anything in the next week.     This note has been created with Education officer, environmentalDragon speech recognition software and smart phrase technology. Any transcriptional errors are unintentional.    Sheilah Rayos A., MD [facility] [place], [place] [phone]   08/23/2013, 3:49 PM

## 2013-08-24 LAB — URINALYSIS
Bilirubin Urine: NEGATIVE
Glucose, UA: NEGATIVE mg/dL
HGB URINE DIPSTICK: NEGATIVE
Ketones, ur: NEGATIVE mg/dL
Leukocytes, UA: NEGATIVE
Nitrite: NEGATIVE
PROTEIN: NEGATIVE mg/dL
Specific Gravity, Urine: 1.011 (ref 1.005–1.030)
Urobilinogen, UA: 0.2 mg/dL (ref 0.0–1.0)
pH: 7 (ref 5.0–8.0)

## 2013-08-24 LAB — COMPREHENSIVE METABOLIC PANEL
ALT: 19 U/L (ref 0–35)
AST: 17 U/L (ref 0–37)
Albumin: 4.1 g/dL (ref 3.5–5.2)
Alkaline Phosphatase: 80 U/L (ref 39–117)
BILIRUBIN TOTAL: 0.4 mg/dL (ref 0.2–1.2)
BUN: 9 mg/dL (ref 6–23)
CALCIUM: 9.5 mg/dL (ref 8.4–10.5)
CHLORIDE: 104 meq/L (ref 96–112)
CO2: 25 meq/L (ref 19–32)
Creat: 0.51 mg/dL (ref 0.50–1.10)
Glucose, Bld: 79 mg/dL (ref 70–99)
Potassium: 3.1 mEq/L — ABNORMAL LOW (ref 3.5–5.3)
SODIUM: 142 meq/L (ref 135–145)
TOTAL PROTEIN: 6.8 g/dL (ref 6.0–8.3)

## 2013-08-24 LAB — MAGNESIUM: Magnesium: 1.7 mg/dL (ref 1.5–2.5)

## 2013-08-24 LAB — LIPID PANEL
CHOLESTEROL: 241 mg/dL — AB (ref 0–200)
HDL: 40 mg/dL (ref >39)
LDL Cholesterol: 169 mg/dL — ABNORMAL HIGH (ref 0–99)
TRIGLYCERIDES: 161 mg/dL — AB (ref <150)
Total CHOL/HDL Ratio: 6 Ratio
VLDL: 32 mg/dL (ref 0–40)

## 2013-08-24 LAB — HEMOGLOBIN A1C
Hgb A1c MFr Bld: 5.9 % — ABNORMAL HIGH (ref <5.7)
Mean Plasma Glucose: 123 mg/dL — ABNORMAL HIGH (ref <117)

## 2013-08-24 LAB — VITAMIN D 25 HYDROXY (VIT D DEFICIENCY, FRACTURES): VIT D 25 HYDROXY: 24 ng/mL — AB (ref 30–89)

## 2013-08-24 LAB — TSH: TSH: 0.565 u[IU]/mL (ref 0.350–4.500)

## 2013-08-24 MED ORDER — POTASSIUM CHLORIDE CRYS ER 20 MEQ PO TBCR: 20.0000 meq | EXTENDED_RELEASE_TABLET | Freq: Every day | ORAL | Status: DC

## 2013-08-24 MED ORDER — ERGOCALCIFEROL 1.25 MG (50000 UT) PO CAPS: 50000.0000 [IU] | ORAL_CAPSULE | ORAL | Status: DC

## 2013-08-24 NOTE — Addendum Note (Signed)
Addended by: Marthann SchillerMATTHEWS, MICHELLE A on: 08/24/2013 01:22 PM   Modules accepted: Orders

## 2013-08-24 NOTE — Progress Notes (Signed)
Patient ID: Deborah SalmLinda S Knight, female   DOB: 10/28/1965, 48 y.o.   MRN: [id number] Potassium and Vitamin D ordered in response to lab results.

## 2013-08-24 NOTE — Progress Notes (Signed)
Quick Note:  Labs reviewed and pt has mildly decreased potassium and borderline low magnesium. She also has markedly decreased vitamin D levels. I will place an order for supplementation of potassium, magnesium and vitamin D. She also has elevated Cholesterol levels with an increase ASCVD 10 year risk. Will discuss initiating high dose statins on next visit. ______

## 2013-09-14 ENCOUNTER — Encounter: Payer: Self-pay | Admitting: Internal Medicine

## 2013-09-14 ENCOUNTER — Ambulatory Visit (INDEPENDENT_AMBULATORY_CARE_PROVIDER_SITE_OTHER): Payer: No Typology Code available for payment source | Admitting: Internal Medicine

## 2013-09-14 VITALS — BP 126/82 | HR 85 | Temp 98.0°F | Resp 20 | Wt 228.0 lb

## 2013-09-14 DIAGNOSIS — IMO0002 Reserved for concepts with insufficient information to code with codable children: Secondary | ICD-10-CM

## 2013-09-14 DIAGNOSIS — S76219A Strain of adductor muscle, fascia and tendon of unspecified thigh, initial encounter: Secondary | ICD-10-CM | POA: Insufficient documentation

## 2013-09-14 NOTE — Progress Notes (Signed)
Patient ID: Deborah Knight Knight, female   DOB: 08/28/1965, 48 y.o.   MRN: [id number]   Deborah Knight, is a 48 y.o. female  WUJ:[id number]  NFA:[id number]:[id number]  DOB - 02/16/1966  CC:  Chief Complaint  Patient presents with  . Rt Leg and Thigh Pain    Pt complained of Rt leg and Thigh Pain x'Knight 4 weeks Pt states she has taken  OTC  for the pain does not seem to help. Pt states when walking pain comes as well as when sleeping .       HPI: Deborah Knight is a 48 y.o. female here today with c/o pain in right groin for the last 4 weeks. She reports that the pain started insidiously and got progressively worse over the last 4 weeks. She cannot recall any trauma. The pain is localized to the right inner thigh and is worse with walking and any movement of abduction of the RLE.  The pain is throbbing in nature and is a 10/10 at it'Knight worst. The pain is non radiating and there are no associated symptoms. She denies any focal neurological symptoms. The pain is not made worse with cough or valsalva maneuver.   Patient has No headache, No chest pain, No abdominal pain - No Nausea, No new weakness tingling or numbness, No Cough - SOB.  Allergies  Allergen Reactions  . Losartan Swelling    Lip swelling  . Shellfish-Derived Products     Other reaction(Knight): Angioedema (ALLERGY/intolerance)  . Toradol [Ketorolac Tromethamine] Swelling    Lip swelling  . Latex Rash   Past Medical History  Diagnosis Date  . Hypertension   . Anxiety   . Seasonal allergies   . HLD (hyperlipidemia)   . Asthma    Current Outpatient Prescriptions on File Prior to Visit  Medication Sig Dispense Refill  . albuterol (PROVENTIL HFA;VENTOLIN HFA) 108 (90 BASE) MCG/ACT inhaler Inhale 2 puffs into the lungs every 6 (six) hours as needed for wheezing. Shortness of breath  1 Inhaler  2  . amLODipine (NORVASC) 10 MG tablet Take 1 tablet (10 mg total) by mouth daily.  90 tablet  1  . cetirizine (ZYRTEC) 10 MG tablet Take 1  tablet (10 mg total) by mouth daily.  90 tablet  3  . diphenhydrAMINE (BENADRYL) 25 MG tablet Take 25 mg by mouth every 6 (six) hours as needed for itching or allergies.      Marland Kitchen. ergocalciferol (VITAMIN D2) 50000 UNITS capsule Take 1 capsule (50,000 Units total) by mouth once a week.  4 capsule  12  . fluticasone (FLONASE) 50 MCG/ACT nasal spray Place 2 sprays into both nostrils daily as needed (congestion).  16 g  3  . fluticasone-salmeterol (ADVAIR HFA) 115-21 MCG/ACT inhaler Inhale 2 puffs into the lungs 2 (two) times daily.  1 Inhaler  11  . hydrochlorothiazide (HYDRODIURIL) 25 MG tablet Take 1 tablet (25 mg total) by mouth daily.  90 tablet  1  . montelukast (SINGULAIR) 10 MG tablet Take 1 tablet (10 mg total) by mouth at bedtime.  90 tablet  3  . omeprazole (PRILOSEC) 20 MG capsule Take 1 capsule (20 mg total) by mouth daily.  90 capsule  3  . potassium chloride SA (K-DUR,KLOR-CON) 20 MEQ tablet Take 1 tablet (20 mEq total) by mouth daily.  30 tablet  3  . sertraline (ZOLOFT) 50 MG tablet TAKE 1 TABLET (50 MG TOTAL) BY MOUTH DAILY.  90 tablet  1  . oxyCODONE-acetaminophen (PERCOCET/ROXICET) 5-325 MG  per tablet Take 2 tablets by mouth every 4 (four) hours as needed for severe pain.  6 tablet  0  . promethazine (PHENERGAN) 25 MG tablet Take 1 tablet (25 mg total) by mouth every 6 (six) hours as needed for nausea or vomiting.  12 tablet  0   No current facility-administered medications on file prior to visit.   Family History  Problem Relation Age of Onset  . Asthma Father   . Asthma Sister   . Asthma Brother   . Asthma Maternal Grandmother   . Asthma Maternal Grandfather   . Asthma Paternal Grandmother   . Asthma Paternal Grandfather   . Cancer Father   . Hyperlipidemia Mother   . Hyperlipidemia Sister    History   Social History  . Marital Status: Single    Spouse Name: N/A    Number of Children: N/A  . Years of Education: N/A   Occupational History  . Not on file.   Social  History Main Topics  . Smoking status: Current Some Day Smoker    Types: Cigars  . Smokeless tobacco: Not on file  . Alcohol Use: No  . Drug Use: No  . Sexual Activity: Yes    Birth Control/ Protection: Condom, None   Other Topics Concern  . Not on file   Social History Narrative   Employed as a Education officer, environmentalchildcare worker.    Review of Systems: Constitutional: Negative for fever, chills, diaphoresis, activity change, appetite change and fatigue. HENT: Negative for ear pain, nosebleeds, congestion, facial swelling, rhinorrhea, neck pain, neck stiffness and ear discharge.  Respiratory: Negative for cough, choking, chest tightness, shortness of breath, wheezing and stridor. . Genitourinary: Negative for dysuria, urgency, frequency, hematuria, flank pain, decreased urine volume, difficulty urinating and dyspareunia.  Musculoskeletal: Negative for back pain, joint swelling, arthralgia and gait problem. Neurological: Negative for dizziness, tremors, seizures, syncope, facial asymmetry, speech difficulty, weakness, light-headedness, numbness and headaches.    Objective:   Filed Vitals:   09/14/13 1329  BP: 126/82  Pulse: 85  Temp: 98 F (36.7 C)  Resp: 20    Physical Exam: Constitutional: Patient appears well-developed and well-nourished. No apparent distress. CVS: RRR, S1/S2 +, no murmurs, no gallops, no carotid bruit.  Pulmonary: Effort and breath sounds normal, no stridor, rhonchi, wheezes, rales.  Abdominal: Soft. BS +, no distension, tenderness, rebound or guarding.  Musculoskeletal: Normal range of motion of right limited in abduction by groin pain. Adductor muscles are tender to deep palpation. Lymphadenopathy: No lymphadenopathy noted, cervical, inguinal or axillary Neuro: Alert. Normal reflexes, muscle tone coordination. No cranial nerve deficit. Skin: Skin is warm and dry. No rash noted. Not diaphoretic. No erythema. No pallor. Psychiatric: Normal mood and affect. Behavior,  judgment, thought content normal.  Lab Results  Component Value Date   WBC 5.5 08/23/2013   HGB 13.5 08/23/2013   HCT 40.6 08/23/2013   MCV 83.7 08/23/2013   PLT 267 08/23/2013   Lab Results  Component Value Date   CREATININE 0.51 08/23/2013   BUN 9 08/23/2013   NA 142 08/23/2013   K 3.1* 08/23/2013   CL 104 08/23/2013   CO2 25 08/23/2013    Lab Results  Component Value Date   HGBA1C 5.9* 08/23/2013   Lipid Panel     Component Value Date/Time   CHOL 241* 08/23/2013 1604   TRIG 161* 08/23/2013 1604   HDL 40 08/23/2013 1604   CHOLHDL 6.0 08/23/2013 1604   VLDL 32 08/23/2013 1604  LDLCALC 169* 08/23/2013 1604       Assessment and plan:   1. Groin strain, initial encounter: Pt instructed in stretching exercises and deep transverse friction massage. She was also instructed to use NSAID'Knight as needed for pain. She was offered Tylenol with codeine but states that she is unable to afford the medication. I have also advised compression with an Ace wrap when ambulating.   Return in about 9 days (around 09/23/2013), or as scheduled.  The patient was given clear instructions to go to ER or return to medical center if symptoms don't improve, worsen or new problems develop. The patient verbalized understanding. The patient was told to call to get lab results if they haven't heard anything in the next week.     This note has been created with Education officer, environmental. Any transcriptional errors are unintentional.    Breiana Stratmann A., MD [facility] [place], [place] [phone]   09/14/2013, 2:29 PM

## 2013-09-23 ENCOUNTER — Ambulatory Visit (INDEPENDENT_AMBULATORY_CARE_PROVIDER_SITE_OTHER): Payer: No Typology Code available for payment source | Admitting: Internal Medicine

## 2013-09-23 ENCOUNTER — Encounter: Payer: Self-pay | Admitting: Internal Medicine

## 2013-09-23 VITALS — BP 142/98 | HR 84 | Temp 98.4°F | Ht 62.0 in | Wt 228.0 lb

## 2013-09-23 DIAGNOSIS — E559 Vitamin D deficiency, unspecified: Secondary | ICD-10-CM | POA: Insufficient documentation

## 2013-09-23 DIAGNOSIS — E876 Hypokalemia: Secondary | ICD-10-CM

## 2013-09-23 DIAGNOSIS — F329 Major depressive disorder, single episode, unspecified: Secondary | ICD-10-CM

## 2013-09-23 DIAGNOSIS — Z0000 Encounter for general adult medical examination without abnormal findings: Secondary | ICD-10-CM

## 2013-09-23 DIAGNOSIS — F17200 Nicotine dependence, unspecified, uncomplicated: Secondary | ICD-10-CM

## 2013-09-23 DIAGNOSIS — E785 Hyperlipidemia, unspecified: Secondary | ICD-10-CM

## 2013-09-23 DIAGNOSIS — E669 Obesity, unspecified: Secondary | ICD-10-CM

## 2013-09-23 DIAGNOSIS — F3289 Other specified depressive episodes: Secondary | ICD-10-CM

## 2013-09-23 DIAGNOSIS — I10 Essential (primary) hypertension: Secondary | ICD-10-CM

## 2013-09-23 DIAGNOSIS — Z720 Tobacco use: Secondary | ICD-10-CM

## 2013-09-23 DIAGNOSIS — M249 Joint derangement, unspecified: Secondary | ICD-10-CM

## 2013-09-23 MED ORDER — PRAVASTATIN SODIUM 40 MG PO TABS: 40.0000 mg | ORAL_TABLET | Freq: Every day | ORAL | Status: DC

## 2013-09-23 MED ORDER — SERTRALINE HCL 50 MG PO TABS: ORAL_TABLET | ORAL | Status: DC

## 2013-09-23 MED ORDER — HYDRALAZINE HCL 25 MG PO TABS: 25.0000 mg | ORAL_TABLET | Freq: Three times a day (TID) | ORAL | Status: DC

## 2013-09-23 NOTE — Progress Notes (Signed)
Patient ID: Deborah Knight, female   DOB: 04/23/1965, 48 y.o.   MRN: [id number]   [name], is a 48 y.o. female  WUJ:[id number]  NFA:[id number]  DOB - 10/30/1965  CC:  Chief Complaint  Patient presents with  . Annual Exam       HPI: [name] is a 48 y.o. female here today for her annual visit. She has had a PAP smear in the last 2 years (Jan. 2014) which was normal. She also had her mammogram in the last year. She denies any vaginal discharge.   Her acute complaint today is depression. She has continued to have sleepless nights and frequent crying spells. Much of this is surrounding the thoughts about her son who was killed in January. She is also concerned about the intermittent swelling of her lips that she as had in the past although she  she has not had any recent episodes.   Today her BP is markedly elevated with the diastolic of 101. A repeat BP was 142/98. HR 84  I have reviewed her labs and discussed therapeutic plan.  Patient has No headache, No chest pain, No abdominal pain - No Nausea, No new weakness tingling or numbness, No Cough - SOB.  Allergies  Allergen Reactions  . Losartan Swelling    Lip swelling  . Shellfish-Derived Products     Other reaction(s): Angioedema (ALLERGY/intolerance)  . Toradol [Ketorolac Tromethamine] Swelling    Lip swelling  . Latex Rash   Past Medical History  Diagnosis Date  . Hypertension   . Anxiety   . Seasonal allergies   . HLD (hyperlipidemia)   . Asthma    Current Outpatient Prescriptions on File Prior to Visit  Medication Sig Dispense Refill  . albuterol (PROVENTIL HFA;VENTOLIN HFA) 108 (90 BASE) MCG/ACT inhaler Inhale 2 puffs into the lungs every 6 (six) hours as needed for wheezing. Shortness of breath  1 Inhaler  2  . amLODipine (NORVASC) 10 MG tablet Take 1 tablet (10 mg total) by mouth daily.  90 tablet  1  . cetirizine (ZYRTEC) 10 MG tablet Take 1 tablet (10 mg total) by mouth daily.  90 tablet   3  . diphenhydrAMINE (BENADRYL) 25 MG tablet Take 25 mg by mouth every 6 (six) hours as needed for itching or allergies.      Marland Kitchen. ergocalciferol (VITAMIN D2) 50000 UNITS capsule Take 1 capsule (50,000 Units total) by mouth once a week.  4 capsule  12  . fluticasone (FLONASE) 50 MCG/ACT nasal spray Place 2 sprays into both nostrils daily as needed (congestion).  16 g  3  . fluticasone-salmeterol (ADVAIR HFA) 115-21 MCG/ACT inhaler Inhale 2 puffs into the lungs 2 (two) times daily.  1 Inhaler  11  . hydrochlorothiazide (HYDRODIURIL) 25 MG tablet Take 1 tablet (25 mg total) by mouth daily.  90 tablet  1  . montelukast (SINGULAIR) 10 MG tablet Take 1 tablet (10 mg total) by mouth at bedtime.  90 tablet  3  . omeprazole (PRILOSEC) 20 MG capsule Take 1 capsule (20 mg total) by mouth daily.  90 capsule  3  . oxyCODONE-acetaminophen (PERCOCET/ROXICET) 5-325 MG per tablet Take 2 tablets by mouth every 4 (four) hours as needed for severe pain.  6 tablet  0  . potassium chloride SA (K-DUR,KLOR-CON) 20 MEQ tablet Take 1 tablet (20 mEq total) by mouth daily.  30 tablet  3  . promethazine (PHENERGAN) 25 MG tablet Take 1 tablet (25 mg total) by mouth every  6 (six) hours as needed for nausea or vomiting.  12 tablet  0   No current facility-administered medications on file prior to visit.   Family History  Problem Relation Age of Onset  . Asthma Father   . Asthma Sister   . Asthma Brother   . Asthma Maternal Grandmother   . Asthma Maternal Grandfather   . Asthma Paternal Grandmother   . Asthma Paternal Grandfather   . Cancer Father   . Hyperlipidemia Mother   . Hyperlipidemia Sister    History   Social History  . Marital Status: Single    Spouse Name: N/A    Number of Children: N/A  . Years of Education: N/A   Occupational History  . Not on file.   Social History Main Topics  . Smoking status: Current Some Day Smoker    Types: Cigars  . Smokeless tobacco: Not on file  . Alcohol Use: No  .  Drug Use: No  . Sexual Activity: Yes    Birth Control/ Protection: Condom, None   Other Topics Concern  . Not on file   Social History Narrative   Employed as a Education officer, environmental.    Review of Systems: Constitutional: Negative for fever, chills, diaphoresis, activity change, appetite change and fatigue. HENT: Negative for ear pain, nosebleeds, congestion, facial swelling, rhinorrhea, neck pain, neck stiffness and ear discharge.  Eyes: Negative for pain, discharge, redness, itching and visual disturbance. Respiratory: Negative for cough, choking, chest tightness, shortness of breath, wheezing and stridor.  Cardiovascular: Negative for chest pain, palpitations and leg swelling. Gastrointestinal: Negative for abdominal distention. Genitourinary: Negative for dysuria, urgency, frequency, hematuria, flank pain, decreased urine volume, difficulty urinating and dyspareunia.  Musculoskeletal:Decreased ROM in Right shoulder. Negative for back pain, joint swelling, arthralgia and gait problem. Neurological: Negative for dizziness, tremors, seizures, syncope, facial asymmetry, speech difficulty, weakness, light-headedness, numbness and headaches.  Hematological: Negative for adenopathy. Does not bruise/bleed easily. Psychiatric/Behavioral: Negative for hallucinations, behavioral problems, confusion, dysphoric mood, decreased concentration and agitation.    Objective:    Filed Vitals:   09/23/13 1138  BP: 142/98  Pulse:   Temp:     Physical Exam: Constitutional: Patient is obese and appears well-developed and well-nourished. No distress. However she is teary and reports taht she has been up crying all night. HENT: Normocephalic, atraumatic, External right and left ear normal. Oropharynx is clear and moist.  Eyes: Conjunctivae and EOM are normal. PERRLA, no scleral icterus. Neck: Normal ROM. Neck supple. No JVD. No tracheal deviation. No thyromegaly. CVS: RRR, S1/S2 +, no murmurs, no  gallops, no carotid bruit.  Pulmonary: Effort and breath sounds normal, no stridor, rhonchi, wheezes, rales.  Abdominal: Soft. BS +, no distension, tenderness, rebound or guarding.  Musculoskeletal: Pt has  No edema and no tenderness.  Lymphadenopathy: No lymphadenopathy noted, cervical, inguinal or axillary Neuro: Alert. Normal reflexes, muscle tone coordination. No cranial nerve deficit. Skin: Skin is warm and dry. No rash noted. Not diaphoretic. No erythema. No pallor. Psychiatric: Normal mood and affect. Behavior, judgment, thought content normal.  Lab Results  Component Value Date   WBC 5.5 08/23/2013   HGB 13.5 08/23/2013   HCT 40.6 08/23/2013   MCV 83.7 08/23/2013   PLT 267 08/23/2013   Lab Results  Component Value Date   CREATININE 0.51 08/23/2013   BUN 9 08/23/2013   NA 142 08/23/2013   K 3.1* 08/23/2013   CL 104 08/23/2013   CO2 25 08/23/2013    Lab Results  Component Value Date   HGBA1C 5.9* 08/23/2013   Lipid Panel     Component Value Date/Time   CHOL 241* 08/23/2013 1604   TRIG 161* 08/23/2013 1604   HDL 40 08/23/2013 1604   CHOLHDL 6.0 08/23/2013 1604   VLDL 32 08/23/2013 1604   LDLCALC 169* 08/23/2013 1604       Assessment and plan:   1. HYPERLIPIDEMIA - 10 years ASCVD risk is 18.6% thus high dose statins recommended. Pt agreeable. - pravastatin (PRAVACHOL) 40 MG tablet; Take 1 tablet (40 mg total) by mouth daily.  Dispense: 90 tablet; Refill: 3  2. DEPRESSION - Pt still scoring 19 on depression screen. I will increase Zoloft to 75 mg. Also referral placed for Psychiatry and am awaiting placement from [facility]. Also advised patient to go to [facility] walk-in clinic. - sertraline (ZOLOFT) 50 MG tablet; TAKE 1.5 TABLET (75 MG TOTAL) BY MOUTH DAILY.  Dispense: 90 tablet; Refill: 1  3.. HYPERTENSION Uncontrolled. Will add Hydralizine. - hydrALAZINE (APRESOLINE) 25 MG tablet; Take 1 tablet (25 mg total) by mouth 3 (three) times daily.  Dispense: 90 tablet; Refill:  2  4. OBESITY - Recommended that patient with restricting calories. Handout given  5. Tobacco abuse -Counseled against tobacco use. In contemplative state.  6. Joint derangement, shoulder region -Pt has had surgery on Left shoulder and is still currently following with [facility].   7. Unspecified vitamin D deficiency - Pt has not yet received her Drisdol from hte Pharmacy. Prescription written on 08/23/2013.  8. Hypokalemia Pt has mild hypokalemia despite eating bananas. I will check labs today particularly in light of medication interactions. - Magnesium - Potassium   Return for HTN, Hyperlipidemia, Tobacco Use Disorder, Depression, Obesity.  The patient was given clear instructions to go to ER or return to medical center if symptoms don't improve, worsen or new problems develop. The patient verbalized understanding. The patient was told to call to get lab results if they haven't heard anything in the next week.     This note has been created with Education officer, environmental. Any transcriptional errors are unintentional.    Daishawn Lauf A., MD [facility] [place], [place] [phone]   09/23/2013, 12:05 PM

## 2013-09-24 LAB — POTASSIUM: Potassium: 3.6 mEq/L (ref 3.5–5.3)

## 2013-09-24 LAB — MAGNESIUM: MAGNESIUM: 2.1 mg/dL (ref 1.5–2.5)

## 2013-09-28 ENCOUNTER — Encounter: Payer: Self-pay | Admitting: *Deleted

## 2013-09-28 NOTE — Progress Notes (Deleted)
   Subjective:    Patient ID: Deborah Knight, female    DOB: 12/01/1965, 48 y.o.   MRN: [id number]  HPI    Review of Systems     Objective:   Physical Exam        Assessment & Plan:

## 2013-09-28 NOTE — Progress Notes (Signed)
Information given to [facility] for the patient to be referred to counseling. Patient will be contacted once an appt has been made.

## 2013-10-04 ENCOUNTER — Other Ambulatory Visit: Payer: Self-pay

## 2013-10-04 ENCOUNTER — Ambulatory Visit (INDEPENDENT_AMBULATORY_CARE_PROVIDER_SITE_OTHER): Payer: No Typology Code available for payment source | Admitting: Internal Medicine

## 2013-10-04 VITALS — BP 141/85 | HR 91 | Temp 98.2°F | Resp 16 | Ht 62.0 in | Wt 228.0 lb

## 2013-10-04 DIAGNOSIS — J4520 Mild intermittent asthma, uncomplicated: Secondary | ICD-10-CM

## 2013-10-04 DIAGNOSIS — Z5189 Encounter for other specified aftercare: Secondary | ICD-10-CM

## 2013-10-04 DIAGNOSIS — F3289 Other specified depressive episodes: Secondary | ICD-10-CM

## 2013-10-04 DIAGNOSIS — S76219D Strain of adductor muscle, fascia and tendon of unspecified thigh, subsequent encounter: Secondary | ICD-10-CM

## 2013-10-04 DIAGNOSIS — M545 Low back pain, unspecified: Secondary | ICD-10-CM

## 2013-10-04 DIAGNOSIS — M5380 Other specified dorsopathies, site unspecified: Secondary | ICD-10-CM

## 2013-10-04 DIAGNOSIS — J45909 Unspecified asthma, uncomplicated: Secondary | ICD-10-CM

## 2013-10-04 DIAGNOSIS — T783XXS Angioneurotic edema, sequela: Secondary | ICD-10-CM

## 2013-10-04 DIAGNOSIS — I10 Essential (primary) hypertension: Secondary | ICD-10-CM

## 2013-10-04 DIAGNOSIS — F329 Major depressive disorder, single episode, unspecified: Secondary | ICD-10-CM

## 2013-10-04 DIAGNOSIS — E669 Obesity, unspecified: Secondary | ICD-10-CM

## 2013-10-04 DIAGNOSIS — M62830 Muscle spasm of back: Secondary | ICD-10-CM

## 2013-10-04 DIAGNOSIS — T7589XS Other specified effects of external causes, sequela: Secondary | ICD-10-CM

## 2013-10-04 DIAGNOSIS — T788XXS Other adverse effects, not elsewhere classified, sequela: Secondary | ICD-10-CM

## 2013-10-04 DIAGNOSIS — IMO0002 Reserved for concepts with insufficient information to code with codable children: Secondary | ICD-10-CM

## 2013-10-04 MED ORDER — CYCLOBENZAPRINE HCL 10 MG PO TABS: 10.0000 mg | ORAL_TABLET | Freq: Three times a day (TID) | ORAL | Status: DC | PRN

## 2013-10-04 MED ORDER — METOPROLOL TARTRATE 25 MG PO TABS: 25.0000 mg | ORAL_TABLET | Freq: Two times a day (BID) | ORAL | Status: DC

## 2013-10-04 NOTE — Progress Notes (Signed)
Patient ID: Deborah Knight, female   DOB: 08/22/1965, 48 y.o.   MRN: [id number]   Deborah Knight, is a 48 y.o. female  [id number]  [id number]  DOB - 08/31/1965  CC:  Chief Complaint  Patient presents with  . Follow-up       HPI: Deborah Knight is a 48 y.o. female here today for follow-up on depression, hypokalemia and back pain. Pt continues to have back spasms and pain in the right groin. The back spasms are intermittent and occur as a cramping feeling in the low back. It is not associated with any other symptoms. It improves with stretching sometimes. Pt also has pain in the right groin which resulted from a strain. The pain is improving but seems to be worse in the morning.  [facility], but states that she is feeling a little better in her spirit as her mother had a celebration for her yesterday.  Patient has No headache, No chest pain, No abdominal pain - No Nausea, No new weakness tingling or numbness, No Cough - SOB.  Allergies  Allergen Reactions  . Losartan Swelling    Lip swelling  . Shellfish-Derived Products     Other reaction(s): Angioedema (ALLERGY/intolerance)  . Toradol [Ketorolac Tromethamine] Swelling    Lip swelling  . Latex Rash   Past Medical History  Diagnosis Date  . Hypertension   . Anxiety   . Seasonal allergies   . HLD (hyperlipidemia)   . Asthma    Current Outpatient Prescriptions on File Prior to Visit  Medication Sig Dispense Refill  . albuterol (PROVENTIL HFA;VENTOLIN HFA) 108 (90 BASE) MCG/ACT inhaler Inhale 2 puffs into the lungs every 6 (six) hours as needed for wheezing. Shortness of breath  1 Inhaler  2  . amLODipine (NORVASC) 10 MG tablet Take 1 tablet (10 mg total) by mouth daily.  90 tablet  1  . cetirizine (ZYRTEC) 10 MG tablet Take 1 tablet (10 mg total) by mouth daily.  90 tablet  3  . diphenhydrAMINE (BENADRYL) 25 MG tablet Take 25 mg by mouth every 6 (six) hours as needed for itching or  allergies.      Marland Kitchen. EPINEPHrine (EPIPEN) 0.3 mg/0.3 mL IJ SOAJ injection Inject 0.3 mg into the muscle once.      . ergocalciferol (VITAMIN D2) 50000 UNITS capsule Take 1 capsule (50,000 Units total) by mouth once a week.  4 capsule  12  . fluticasone (FLONASE) 50 MCG/ACT nasal spray Place 2 sprays into both nostrils daily as needed (congestion).  16 g  3  . fluticasone-salmeterol (ADVAIR HFA) 115-21 MCG/ACT inhaler Inhale 2 puffs into the lungs 2 (two) times daily.  1 Inhaler  11  . hydrALAZINE (APRESOLINE) 25 MG tablet Take 1 tablet (25 mg total) by mouth 3 (three) times daily.  90 tablet  2  . hydrochlorothiazide (HYDRODIURIL) 25 MG tablet Take 1 tablet (25 mg total) by mouth daily.  90 tablet  1  . montelukast (SINGULAIR) 10 MG tablet Take 1 tablet (10 mg total) by mouth at bedtime.  90 tablet  3  . omeprazole (PRILOSEC) 20 MG capsule Take 1 capsule (20 mg total) by mouth daily.  90 capsule  3  . oxyCODONE-acetaminophen (PERCOCET/ROXICET) 5-325 MG per tablet Take 2 tablets by mouth every 4 (four) hours as needed for severe pain.  6 tablet  0  . potassium chloride SA (K-DUR,KLOR-CON) 20 MEQ tablet Take 1 tablet (20 mEq total) by mouth daily.  30 tablet  3  . pravastatin (PRAVACHOL) 40 MG tablet Take 1 tablet (40 mg total) by mouth daily.  90 tablet  3  . promethazine (PHENERGAN) 25 MG tablet Take 1 tablet (25 mg total) by mouth every 6 (six) hours as needed for nausea or vomiting.  12 tablet  0  . sertraline (ZOLOFT) 50 MG tablet TAKE 1.5 TABLET (75 MG TOTAL) BY MOUTH DAILY.  90 tablet  1   No current facility-administered medications on file prior to visit.   Family History  Problem Relation Age of Onset  . Asthma Father   . Asthma Sister   . Asthma Brother   . Asthma Maternal Grandmother   . Asthma Maternal Grandfather   . Asthma Paternal Grandmother   . Asthma Paternal Grandfather   . Cancer Father   . Hyperlipidemia Mother   . Hyperlipidemia Sister    History   Social History  .  Marital Status: Single    Spouse Name: N/A    Number of Children: N/A  . Years of Education: N/A   Occupational History  . Not on file.   Social History Main Topics  . Smoking status: Current Some Day Smoker    Types: Cigars  . Smokeless tobacco: Not on file  . Alcohol Use: No  . Drug Use: No  . Sexual Activity: Yes    Birth Control/ Protection: Condom, None   Other Topics Concern  . Not on file   Social History Narrative   Employed as a Education officer, environmental.    Review of Systems: Constitutional: Negative for fever, chills, diaphoresis, activity change, appetite change and fatigue. HENT: Negative for ear pain, nosebleeds, congestion, facial swelling, rhinorrhea, neck pain, neck stiffness and ear discharge.  Eyes: Negative for pain, discharge, redness, itching and visual disturbance. Respiratory: Negative for cough, choking, chest tightness, shortness of breath, wheezing and stridor.  Cardiovascular: Negative for chest pain, palpitations and leg swelling. Gastrointestinal: Negative for abdominal distention. Genitourinary: Negative for dysuria, urgency, frequency, hematuria, flank pain, decreased urine volume, difficulty urinating and dyspareunia.  Neurological: Negative for dizziness, tremors, seizures, syncope, facial asymmetry, speech difficulty, weakness, light-headedness, numbness and headaches.  Hematological: Negative for adenopathy. Does not bruise/bleed easily. Psychiatric/Behavioral: Negative for hallucinations, behavioral problems, confusion, dysphoric mood, decreased concentration and agitation.    Objective:    Filed Vitals:   10/04/13 1317  BP: 141/85  Pulse: 91  Temp: 98.2 F (36.8 C)  Resp: 16    Physical Exam: Constitutional: Patient appears well-developed and well-nourished. No distress. HENT: Normocephalic, atraumatic, External right and left ear normal. Oropharynx is clear and moist.  Eyes: Conjunctivae and EOM are normal. PERRLA, no scleral  icterus. Neck: Normal ROM. Neck supple. No JVD. No tracheal deviation. No thyromegaly. CVS: RRR, S1/S2 +, no murmurs, no gallops, no carotid bruit.  Pulmonary: Effort and breath sounds normal, no stridor, rhonchi, wheezes, rales.  Abdominal: Soft. BS +, no distension, tenderness, rebound or guarding.  Musculoskeletal: Normal range of motion. No edema and no tenderness. Paraspinal spasm and  tenderness present.  Lymphadenopathy: No lymphadenopathy noted, cervical, inguinal or axillary Neuro: Alert. Normal reflexes, muscle tone coordination. No cranial nerve deficit. Skin: Skin is warm and dry. No rash noted. Not diaphoretic. No erythema. No pallor. Psychiatric: Normal mood and affect. Behavior, judgment, thought content normal.  Lab Results  Component Value Date   WBC 5.5 08/23/2013   HGB 13.5 08/23/2013   HCT 40.6 08/23/2013   MCV 83.7 08/23/2013   PLT 267 08/23/2013  Lab Results  Component Value Date   CREATININE 0.51 08/23/2013   BUN 9 08/23/2013   NA 142 08/23/2013   K 3.6 09/23/2013   CL 104 08/23/2013   CO2 25 08/23/2013    Lab Results  Component Value Date   HGBA1C 5.9* 08/23/2013   Lipid Panel     Component Value Date/Time   CHOL 241* 08/23/2013 1604   TRIG 161* 08/23/2013 1604   HDL 40 08/23/2013 1604   CHOLHDL 6.0 08/23/2013 1604   VLDL 32 08/23/2013 1604   LDLCALC 169* 08/23/2013 1604       Assessment and plan:   1. Back spasm - Pt has had a history of back spasms and has used flexeril in the past which has been effective.  - cyclobenzaprine (FLEXERIL) 10 MG tablet; Take 1 tablet (10 mg total) by mouth 3 (three) times daily as needed for muscle spasms.  Dispense: 30 tablet; Refill: 0   2. DEPRESSION - Pt went to [facility] as a walk in but was unable to be seen due to volume. She plans to go back this week. She is neither suicidal or homicidal. Continue Zoloft.  4. Groin strain, subsequent encounter - Pt reports that her pain in the groin is improved but is worse in hte  morning and improves with activity as the day progresses. - Continue stretches and applying heat and cold before exercise.  5. OBESITY - Discussed calorie restriction with patient and advised a calorie limit of 2500 /day based on current average caloric intake of 3000 calories/day and a weight loss goal of 1-2 #'s week. Pt was given the [employer] Eating Plate as a reference.  6. Angioedema, sequela - Pt has described angioedema and reports that she has had a work-up for this. It is unclear what the work-up has been. Patient is to bring me the name of the Physician by whom she was evaluated and I will review and pursue any further evaluation and management as indicated.  7. HYPERTENSION - BP still mildly elevated. Pt on Hydralazine TID which is impacting compliance. Additionally she has tachycardia so I will add Metoprolol at 25 mg and eliminate a dose of Hydralazine. The goal being to wen off Hydralazine and replace with long acting metoprolol at appropriate dose.  - metoprolol tartrate (LOPRESSOR) 25 MG tablet; Take 1 tablet (25 mg total) by mouth 2 (two) times daily.  Dispense: 60 tablet; Refill: 1  8. Mild Intermittent Asthma - Last PFT's more than 5 years ago. Will repeat PFT to evaluate pulmonary function.    Follow-up in 2 months for HTN, depression, Obesity.  The patient was given clear instructions to go to ER or return to medical center if symptoms don't improve, worsen or new problems develop. The patient verbalized understanding. The patient was told to call to get lab results if they haven't heard anything in the next week.     This note has been created with Education officer, environmental. Any transcriptional errors are unintentional.    MATTHEWS,MICHELLE A., MD [facility] [place], [place] [phone]   10/04/2013, 2:02 PM

## 2013-11-16 ENCOUNTER — Ambulatory Visit (INDEPENDENT_AMBULATORY_CARE_PROVIDER_SITE_OTHER): Payer: No Typology Code available for payment source | Admitting: Family Medicine

## 2013-11-16 VITALS — BP 143/76 | HR 84 | Temp 98.2°F | Resp 18 | Ht 62.0 in | Wt 228.0 lb

## 2013-11-16 DIAGNOSIS — L304 Erythema intertrigo: Secondary | ICD-10-CM

## 2013-11-16 DIAGNOSIS — L299 Pruritus, unspecified: Secondary | ICD-10-CM

## 2013-11-16 DIAGNOSIS — Z1231 Encounter for screening mammogram for malignant neoplasm of breast: Secondary | ICD-10-CM

## 2013-11-16 DIAGNOSIS — J45909 Unspecified asthma, uncomplicated: Secondary | ICD-10-CM

## 2013-11-16 DIAGNOSIS — T783XXD Angioneurotic edema, subsequent encounter: Secondary | ICD-10-CM

## 2013-11-16 DIAGNOSIS — J309 Allergic rhinitis, unspecified: Secondary | ICD-10-CM

## 2013-11-16 DIAGNOSIS — F3289 Other specified depressive episodes: Secondary | ICD-10-CM

## 2013-11-16 DIAGNOSIS — Z87898 Personal history of other specified conditions: Secondary | ICD-10-CM

## 2013-11-16 DIAGNOSIS — Z23 Encounter for immunization: Secondary | ICD-10-CM

## 2013-11-16 DIAGNOSIS — R7303 Prediabetes: Secondary | ICD-10-CM

## 2013-11-16 DIAGNOSIS — T783XXA Angioneurotic edema, initial encounter: Secondary | ICD-10-CM

## 2013-11-16 DIAGNOSIS — E785 Hyperlipidemia, unspecified: Secondary | ICD-10-CM

## 2013-11-16 DIAGNOSIS — F329 Major depressive disorder, single episode, unspecified: Secondary | ICD-10-CM

## 2013-11-16 DIAGNOSIS — K219 Gastro-esophageal reflux disease without esophagitis: Secondary | ICD-10-CM

## 2013-11-16 DIAGNOSIS — Z720 Tobacco use: Secondary | ICD-10-CM

## 2013-11-16 DIAGNOSIS — E669 Obesity, unspecified: Secondary | ICD-10-CM

## 2013-11-16 DIAGNOSIS — F17200 Nicotine dependence, unspecified, uncomplicated: Secondary | ICD-10-CM

## 2013-11-16 DIAGNOSIS — L538 Other specified erythematous conditions: Secondary | ICD-10-CM

## 2013-11-16 DIAGNOSIS — Z5189 Encounter for other specified aftercare: Secondary | ICD-10-CM

## 2013-11-16 DIAGNOSIS — R7309 Other abnormal glucose: Secondary | ICD-10-CM

## 2013-11-16 MED ORDER — DIPHENHYDRAMINE HCL 12.5 MG/5ML PO ELIX: 12.5000 mg | ORAL_SOLUTION | Freq: Four times a day (QID) | ORAL | Status: DC | PRN

## 2013-11-16 MED ORDER — TRIAMCINOLONE ACETONIDE 0.1 % EX CREA: 1.0000 "application " | TOPICAL_CREAM | Freq: Two times a day (BID) | CUTANEOUS | Status: DC

## 2013-11-16 NOTE — Patient Instructions (Signed)
Intertrigo Intertrigo is a skin condition that occurs in between folds of skin in places on the body that rub together a lot and do not get much ventilation. It is caused by heat, moisture, friction, sweat retention, and lack of air circulation, which produces red, irritated patches and, sometimes, scaling or drainage. People who have diabetes, who are obese, or who have treatment with antibiotics are at increased risk for intertrigo. The most common sites for intertrigo to occur include:  The groin.  The breasts.  The armpits.  Folds of abdominal skin.  Webbed spaces between the fingers or toes. Intertrigo may be aggravated by:  Sweat.  Feces.  Yeast or bacteria that are present near skin folds.  Urine.  Vaginal discharge. HOME CARE INSTRUCTIONS  The following steps can be taken to reduce friction and keep the affected area cool and dry:  Expose skin folds to the air.  Keep deep skin folds separated with cotton or linen cloth. Avoid tight fitting clothing that could cause chafing.  Wear open-toed shoes or sandals to help reduce moisture between the toes.  Apply absorbent powders to affected areas as directed by your caregiver.  Apply over-the-counter barrier pastes, such as zinc oxide, as directed by your caregiver.  If you develop a fungal infection in the affected area, your caregiver may have you use antifungal creams. SEEK MEDICAL CARE IF:   The rash is not improving after 1 week of treatment.  The rash is getting worse (more red, more swollen, more painful, or spreading).  You have a fever or chills. MAKE SURE YOU:   Understand these instructions.  Will watch your condition.  Will get help right away if you are not doing well or get worse. Document Released: 02/25/2005 Document Revised: 05/20/2011 Document Reviewed: 08/10/2009 [facility] Patient Information 2015 [place], [place]. This information is not intended to replace advice given to you by your health  care provider. Make sure you discuss any questions you have with your health care provider. DASH Eating Plan DASH stands for "Dietary Approaches to Stop Hypertension." The DASH eating plan is a healthy eating plan that has been shown to reduce high blood pressure (hypertension). Additional health benefits may include reducing the risk of type 2 diabetes mellitus, heart disease, and stroke. The DASH eating plan may also help with weight loss. WHAT DO I NEED TO KNOW ABOUT THE DASH EATING PLAN? For the DASH eating plan, you will follow these general guidelines:  Choose foods with a percent daily value for sodium of less than 5% (as listed on the food label).  Use salt-free seasonings or herbs instead of table salt or sea salt.  Check with your health care provider or pharmacist before using salt substitutes.  Eat lower-sodium products, often labeled as "lower sodium" or "no salt added."  Eat fresh foods.  Eat more vegetables, fruits, and low-fat dairy products.  Choose whole grains. Look for the word "whole" as the first word in the ingredient list.  Choose fish and skinless chicken or [place] more often than red meat. Limit fish, poultry, and meat to 6 oz (170 g) each day.  Limit sweets, desserts, sugars, and sugary drinks.  Choose heart-healthy fats.  Limit cheese to 1 oz (28 g) per day.  Eat more home-cooked food and less restaurant, buffet, and fast food.  Limit fried foods.  Cook foods using methods other than frying.  Limit canned vegetables. If you do use them, rinse them well to decrease the sodium.  When eating  at a restaurant, ask that your food be prepared with less salt, or no salt if possible. WHAT FOODS CAN I EAT? Seek help from a dietitian for individual calorie needs. Grains Whole grain or whole wheat bread. Brown rice. Whole grain or whole wheat pasta. Quinoa, bulgur, and whole grain cereals. Low-sodium cereals. Corn or whole wheat flour tortillas. Whole grain  cornbread. Whole grain crackers. Low-sodium crackers. Vegetables Fresh or frozen vegetables (raw, steamed, roasted, or grilled). Low-sodium or reduced-sodium tomato and vegetable juices. Low-sodium or reduced-sodium tomato sauce and paste. Low-sodium or reduced-sodium canned vegetables.  Fruits All fresh, canned (in natural juice), or frozen fruits. Meat and Other Protein Products Ground beef (85% or leaner), grass-fed beef, or beef trimmed of fat. Skinless chicken or [place]. Ground chicken or [place]. Pork trimmed of fat. All fish and seafood. Eggs. Dried beans, peas, or lentils. Unsalted nuts and seeds. Unsalted canned beans. Dairy Low-fat dairy products, such as skim or 1% milk, 2% or reduced-fat cheeses, low-fat ricotta or cottage cheese, or plain low-fat yogurt. Low-sodium or reduced-sodium cheeses. Fats and Oils Tub margarines without trans fats. Light or reduced-fat mayonnaise and salad dressings (reduced sodium). Avocado. Safflower, olive, or canola oils. Natural peanut or almond butter. Other Unsalted popcorn and pretzels. The items listed above may not be a complete list of recommended foods or beverages. Contact your dietitian for more options. WHAT FOODS ARE NOT RECOMMENDED? Grains White bread. White pasta. White rice. Refined cornbread. Bagels and croissants. Crackers that contain trans fat. Vegetables Creamed or fried vegetables. Vegetables in a cheese sauce. Regular canned vegetables. Regular canned tomato sauce and paste. Regular tomato and vegetable juices. Fruits Dried fruits. Canned fruit in light or heavy syrup. Fruit juice. Meat and Other Protein Products Fatty cuts of meat. Ribs, chicken wings, bacon, sausage, bologna, salami, chitterlings, fatback, hot dogs, bratwurst, and packaged luncheon meats. Salted nuts and seeds. Canned beans with salt. Dairy Whole or 2% milk, cream, half-and-half, and cream cheese. Whole-fat or sweetened yogurt. Full-fat cheeses or blue cheese.  Nondairy creamers and whipped toppings. Processed cheese, cheese spreads, or cheese curds. Condiments Onion and garlic salt, seasoned salt, table salt, and sea salt. Canned and packaged gravies. Worcestershire sauce. Tartar sauce. Barbecue sauce. Teriyaki sauce. Soy sauce, including reduced sodium. Steak sauce. Fish sauce. Oyster sauce. Cocktail sauce. Horseradish. Ketchup and mustard. Meat flavorings and tenderizers. Bouillon cubes. Hot sauce. Tabasco sauce. Marinades. Taco seasonings. Relishes. Fats and Oils Butter, stick margarine, lard, shortening, ghee, and bacon fat. Coconut, palm kernel, or palm oils. Regular salad dressings. Other Pickles and olives. Salted popcorn and pretzels. The items listed above may not be a complete list of foods and beverages to avoid. Contact your dietitian for more information. WHERE CAN I FIND MORE INFORMATION? National Heart, Lung, and Blood Institute: [employer] Document Released: 02/14/2011 Document Revised: 07/12/2013 Document Reviewed: 12/30/2012 [facility] Patient Information 2015 [place], [place]. This information is not intended to replace advice given to you by your health care provider. Make sure you discuss any questions you have with your health care provider. How to Avoid Diabetes Problems You can do a lot to prevent or slow down diabetes problems. Following your diabetes plan and taking care of yourself can reduce your risk of serious or life-threatening complications. Below, you will find certain things you can do to prevent diabetes problems. MANAGE YOUR DIABETES Follow your health care provider's, nurse educator's, and dietitian's instructions for managing your diabetes. They will teach you the basics of diabetes care. They can help answer  questions you may have. Learn about diabetes and make healthy choices regarding eating and physical activity. Monitor your blood glucose level regularly. Your health care  provider will help you decide how often to check your blood glucose level depending on your treatment goals and how well you are meeting them.  DO NOT USE NICOTINE Nicotine and diabetes are a dangerous combination. Nicotine raises your risk for diabetes problems. If you quit using nicotine, you will lower your risk for heart attack, stroke, nerve disease, and kidney disease. Your cholesterol and your blood pressure levels may improve. Your blood circulation will also improve. Do not use any tobacco products, including cigarettes, chewing tobacco, or electronic cigarettes. If you need help quitting, ask your health care provider. KEEP YOUR BLOOD PRESSURE UNDER CONTROL Keeping your blood pressure under control will help prevent damage to your eyes, kidneys, heart, and blood vessels. Blood pressure consists of two numbers. The top number should be below 120, and the bottom number should be below 80 (120/80). Keep your blood pressure as close to these numbers as you can. If you already have kidney disease, you may want even lower blood pressure to protect your kidneys. Talk to your health care provider to make sure that your blood pressure goal is right for your needs. Meal planning, medicines, and exercise can help you reach your blood pressure target. Have your blood pressure checked at every visit with your health care provider. KEEP YOUR CHOLESTEROL UNDER CONTROL Normal cholesterol levels will help prevent heart disease and stroke. These are the biggest health problems for people with diabetes. Keeping cholesterol levels under control can also help with blood flow. Have your cholesterol level checked at least once a year. Your health care provider may prescribe a medicine known as a statin. Statins lower your cholesterol. If you are not taking a statin, ask your health care provider if you should be. Meal planning, exercise, and medicines can help you reach your cholesterol targets.  SCHEDULE AND KEEP YOUR  ANNUAL PHYSICAL EXAMS AND EYE EXAMS Your health care provider will tell you how often he or she wants to see you depending on your plan of treatment. It is important that you keep these appointments so that possible problems can be identified early and complications can be avoided or treated.  Every visit with your health care provider should include your weight, blood pressure, and an evaluation of your blood glucose control.  Your hemoglobin A1c should be checked:  At least twice a year if you are at your goal.  Every 3 months if there are changes in treatment.  If you are not meeting your goals.  Your blood lipids should be checked yearly. You should also be checked yearly to see if you have protein in your urine (microalbumin).  Schedule a dilated eye exam within 5 years of your diagnosis if you have type 1 diabetes, and then yearly. Schedule a dilated eye exam at diagnosis if you have type 2 diabetes, and then yearly. All exams thereafter can be extended to every 2 to 3 years if one or more exams have been normal. KEEP YOUR VACCINES CURRENT The flu vaccine is recommended yearly. The formula for the vaccine changes every year and needs to be updated for the best protection against current viruses. It is recommended that people with diabetes who are over 59 years old get the pneumonia vaccine. In some cases, two separate shots may be given. Ask your health care provider if your pneumonia vaccination is  up-to-date. However, there are some instances where another vaccine is recommended. Check with your health care provider. TAKE CARE OF YOUR FEET  Diabetes may cause you to have a poor blood supply (circulation) to your legs and feet. Because of this, the skin may be thinner, break easier, and heal more slowly. You also may have nerve damage in your legs and feet, causing decreased feeling. You may not notice minor injuries to your feet that could lead to serious problems or infections. Taking  care of your feet is very important. Visual foot exams are performed at every routine medical visit. The exams check for cuts, injuries, or other problems with the feet. A comprehensive foot exam should be done yearly. This includes visual inspection as well as assessing foot pulses and testing for loss of sensation. You should also do the following:  Inspect your feet daily for cuts, calluses, blisters, ingrown toenails, and signs of infection, such as redness, swelling, or pus.  Wash and dry your feet thoroughly, especially between the toes.  Avoid soaking your feet regularly in hot water baths.  Moisturize dry skin with lotion, avoiding areas between your toes.  Cut toenails straight across and file the edges.  Avoid shoes that do not fit well or have areas that irritate your skin.  Avoid going barefooted or wearing only socks. Your feet need protection. TAKE CARE OF YOUR TEETH People with poorly controlled diabetes are more likely to have gum (periodontal) disease. These infections make diabetes harder to control. Periodontal diseases, if left untreated, can lead to tooth loss. Brush your teeth twice a day, floss, and see your dentist for checkups and cleaning every 6 months, or 2 times a year. ASK YOUR HEALTH CARE PROVIDER ABOUT TAKING ASPIRIN Taking aspirin daily is recommended to help prevent cardiovascular disease in people with and without diabetes. Ask your health care provider if this would benefit you and what dose he or she would recommend. DRINK RESPONSIBLY Moderate amounts of alcohol (less than 1 drink per day for adult women and less than 2 drinks per day for adult men) have a minimal effect on blood glucose if ingested with food. It is important to eat food with alcohol to avoid hypoglycemia. People should avoid alcohol if they have a history of alcohol abuse or dependence, if they are pregnant, and if they have liver disease, pancreatitis, advanced neuropathy, or severe  hypertriglyceridemia. LESSEN STRESS Living with diabetes can be stressful. When you are under stress, your blood glucose may be affected in two ways:  Stress hormones may cause your blood glucose to rise.  You may be distracted from taking good care of yourself. It is a good idea to be aware of your stress level and make changes that are necessary to help you better manage challenging situations. Support groups, planned relaxation, a hobby you enjoy, meditation, healthy relationships, and exercise all work to lower your stress level. If your efforts do not seem to be helping, get help from your health care provider or a trained mental health professional. Document Released: 11/13/2010 Document Revised: 07/12/2013 Document Reviewed: 04/21/2013 [facility] Patient Information 2015 [place], [place]. This information is not intended to replace advice given to you by your health care provider. Make sure you discuss any questions you have with your health care provider.

## 2013-11-16 NOTE — Progress Notes (Signed)
Subjective:    Patient ID: Deborah Knight, female    DOB: April 21, 1965, 48 y.o.   MRN: [id number]  HPI Patient presents complaining of itching. She states that she broke out in hives over the weekend. She reports a history of angio-edema. She states that she has been getting periodic angio-edema for years that typically begins with hives. 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 She states that she took Benadryl with minimal relief on Sunday. The hives have dissipated, but she feels that her throat felt that it was closing on yesterday. She currently denies weakness, shortness of breath, headache, chest pain, hives, nausea, vomiting or diarrhea.  Deborah Knight is also complaining of itching under breast and abdominal folds. She reports that she is constantly moist and itching under breast folds. She also finds that her skin is becoming darker in those areas. She reports that she last had benadryl for itching on Sunday with minimal relief. She also maintains that itching worsens at night.     Review of Systems  Constitutional: Positive for unexpected weight change (weight gain).  HENT: Negative.   Eyes: Negative.   Respiratory: Negative.   Cardiovascular: Negative.   Gastrointestinal: Negative.   Genitourinary: Negative.   Musculoskeletal: Positive for back pain.  Skin: Positive for rash.       Itching under abdominal and breast folds.   Allergic/Immunologic: Negative.   Neurological: Negative.   Hematological: Negative.   Psychiatric/Behavioral: Negative.  Negative for suicidal ideas.       Depression       Objective:   Physical Exam  Constitutional: She is oriented to person, place, and time. She appears well-developed.  HENT:  Head: Normocephalic and atraumatic.  Right Ear: External ear normal.  Left Ear: External ear normal.  Mouth/Throat: Oropharynx is clear and moist.  Eyes: Conjunctivae and EOM are normal.  Pupils are equal, round, and reactive to light.  Neck: Normal range of motion. Neck supple.  Cardiovascular: Normal rate, regular rhythm, normal heart sounds and intact distal pulses.   Pulmonary/Chest: Effort normal and breath sounds normal.  Abdominal: Soft. Bowel sounds are normal.  Musculoskeletal: Normal range of motion.  Neurological: She is alert and oriented to person, place, and time. She has normal reflexes.  Skin: Skin is warm and intact. No cyanosis. Nails show no clubbing.            BP 143/76  Pulse 84  Temp(Src) 98.2 F (36.8 C) (Oral)  Resp 18  Ht  (1.575 m)  Wt 228 lb (103.42 kg)  BMI 41.69 kg/m2  LMP 08/13/2012 Assessment & Plan:l  1. History of angioedema Patient has periodic episodes of angioedema. She states that last episode was 2 months ago.  - diphenhydrAMINE (BENADRYL) 12.5 MG/5ML elixir; Take 5 mLs (12.5 mg total) by mouth 4 (four) times daily as needed for itching.  Dispense: 120 mL; Refill: 0  2. Itching Patient has itching underneath breast folds - diphenhydrAMINE (BENADRYL) 12.5 MG/5ML elixir; Take 5 mLs (12.5 mg total) by mouth 4 (four) times daily as needed for itching.  Dispense: 120 mL; Refill: 0  3. Intertrigo Apply cream underneath breast folds twice daily to clean skin.   - triamcinolone cream (KENALOG) 0.1 %; Apply 1 application topically 2 (two) times daily.  Dispense: 30 g; Refill: 0  4.  HYPERLIPIDEMIA Discussed previous laboratory results and diet recommendations at length.   5. ALLERGIC RHINITIS  Stable; continue  with current medication regimen 6. Gastroesophageal reflux disease without esophagitis Stable, continue with current medication regimen  7. DEPRESSION Deborah Knight states that she did not follow up with [facility] due to wait times. She denies suicidal or homicidal ideations. She states that she is dealing with her son's tragic death 1 day at a time. She says that she has a 27 year old son at  home that keeps her going. Also, she has 2 sons that are married and she has grandchildren. .   8. Tobacco abuse She currently smokes 3-4 cigarettes per day. She says that she has cut down on smoking. She is currently not interested in smoking interventions.   9. OBESITY   -Discussed previous hemoglobin a1C results. She is pre-diabetic. Recommend that she start 1800 calorie lowfat, low carbohydrate diet divided over 6 small meals. She is currently eating 1-2 meals per day.   10. ASTHMA  Stable on current medication regimen  11.  Need for prophylactic vaccination and inoculation against influenza - Flu Vaccine QUAD 36+ mos PF IM (Fluarix Quad PF)   12. Other screening mammogram Last mammogram was in November of 2013 per patient  - MM DIGITAL SCREENING BILATERAL; Future   Deborah Maroon, FNP

## 2013-11-17 ENCOUNTER — Encounter: Payer: Self-pay | Admitting: Family Medicine

## 2013-11-17 DIAGNOSIS — R7303 Prediabetes: Secondary | ICD-10-CM | POA: Insufficient documentation

## 2013-12-06 ENCOUNTER — Ambulatory Visit (INDEPENDENT_AMBULATORY_CARE_PROVIDER_SITE_OTHER): Payer: No Typology Code available for payment source | Admitting: Family Medicine

## 2013-12-06 VITALS — BP 156/86 | HR 75 | Temp 98.3°F | Resp 18 | Ht 62.0 in | Wt 223.0 lb

## 2013-12-06 DIAGNOSIS — R7309 Other abnormal glucose: Secondary | ICD-10-CM

## 2013-12-06 DIAGNOSIS — F3289 Other specified depressive episodes: Secondary | ICD-10-CM

## 2013-12-06 DIAGNOSIS — R5381 Other malaise: Secondary | ICD-10-CM

## 2013-12-06 DIAGNOSIS — R7303 Prediabetes: Secondary | ICD-10-CM

## 2013-12-06 DIAGNOSIS — R5383 Other fatigue: Secondary | ICD-10-CM

## 2013-12-06 DIAGNOSIS — F329 Major depressive disorder, single episode, unspecified: Secondary | ICD-10-CM

## 2013-12-06 DIAGNOSIS — G4700 Insomnia, unspecified: Secondary | ICD-10-CM

## 2013-12-06 DIAGNOSIS — I10 Essential (primary) hypertension: Secondary | ICD-10-CM

## 2013-12-06 DIAGNOSIS — Z720 Tobacco use: Secondary | ICD-10-CM

## 2013-12-06 DIAGNOSIS — F32A Depression, unspecified: Secondary | ICD-10-CM

## 2013-12-06 DIAGNOSIS — F17200 Nicotine dependence, unspecified, uncomplicated: Secondary | ICD-10-CM

## 2013-12-06 DIAGNOSIS — E669 Obesity, unspecified: Secondary | ICD-10-CM

## 2013-12-06 MED ORDER — AMITRIPTYLINE HCL 25 MG PO TABS: 25.0000 mg | ORAL_TABLET | Freq: Every day | ORAL | Status: DC

## 2013-12-06 NOTE — Progress Notes (Signed)
Subjective:    Patient ID: Deborah Knight, female    DOB: 06-04-1965, 48 y.o.   MRN: [id number]  HPI  Patient presents for follow up of generalized pruritis and hives. She states that symptoms have improved since last visit. She states that she took Diphenhydramine liquid with moderate relief and has taken a daily anti-histamine. She currently denies weakness, shortness of breath, headache, chest pain, hives, nausea, vomiting or diarrhea.   Patient complaining of depression. She complains of anhedonia and depressed mood. Onset was approximately 1 year ago, gradually worsening since that time. She reports that her son was tragically killed almost 1 year ago. Patient maintains that she has not deatlt with it properly. She states that she has been unable to fall asleep lately due to racing thought.  She denies current suicidal and homicidal plan or intent.   Patient here for follow-up of hypertension She is not exercising and is adherent to low salt diet.  Blood pressure is well controlled at home.  Patient denies chest pain, dyspnea, near-syncope, orthopnea, palpitations, syncope and tachypnea.  Cardiovascular risk factors: hypertension, obesity (BMI >= 30 kg/m2), sedentary lifestyle and smoking/ tobacco exposure.   Past Medical History  Diagnosis Date  . Hypertension   . Anxiety   . Seasonal allergies   . HLD (hyperlipidemia)   . Asthma      Review of Systems  Constitutional: Positive for unexpected weight change (weight gain).  HENT: Negative.   Eyes: Negative.   Respiratory: Negative.   Cardiovascular: Negative.   Gastrointestinal: Negative.   Endocrine: Negative.  Negative for polydipsia, polyphagia and polyuria.  Genitourinary: Negative.   Musculoskeletal: Positive for back pain.  Skin: Positive for rash.       Itching under abdominal and breast folds.   Allergic/Immunologic: Negative.   Neurological: Negative.   Hematological: Negative.   Psychiatric/Behavioral:  Positive for sleep disturbance. Negative for suicidal ideas.       Depression       Objective:   Physical Exam  Constitutional: She is oriented to person, place, and time. Vital signs are normal. She appears well-developed.  HENT:  Head: Normocephalic and atraumatic.  Right Ear: External ear normal.  Left Ear: External ear normal.  Mouth/Throat: Oropharynx is clear and moist.  Eyes: Conjunctivae and EOM are normal. Pupils are equal, round, and reactive to light.  Neck: Normal range of motion. Neck supple.  Cardiovascular: Normal rate, regular rhythm, normal heart sounds and intact distal pulses.   Pulmonary/Chest: Effort normal and breath sounds normal.  Abdominal: Soft. Bowel sounds are normal.  Musculoskeletal: Normal range of motion.  Neurological: She is alert and oriented to person, place, and time. She has normal reflexes.  Skin: Skin is warm, dry and intact. No cyanosis. Nails show no clubbing.  Psychiatric: Thought content normal. She exhibits a depressed mood (tearful).         BP 156/86  Pulse 75  Temp(Src) 98.3 F (36.8 C) (Oral)  Resp 18  Ht  (1.575 m)  Wt 223 lb (101.152 kg)  BMI 40.78 kg/m2  LMP 08/13/2012 Assessment & Plan:l   1. Depression:  Deborah Knight states that she did not follow up with [facility] due to wait time. Will refer to [facility] the [place] for counseling. She denies suicidal or homicidal ideations. She states that she is dealing with her son's tragic death 1 day at a time. She says that she has a 38 year old son at home that  keeps her going. Also, she has 2 sons that are married and she has grandchildren. .   2. . HYPERTENSION Stable; BP repeat is 148/88. She states that her blood pressure has been controlled at home.   - COMPLETE METABOLIC PANEL WITH GFR - CBC with Differential  3. Prediabetes She has decreased 5 pounds since last visit. She states that she is following a carbohydrate modified diet.    4. OBESITY Continue 1800 calorie carb modified diet 5.  Insomnia - amitriptyline (ELAVIL) 25 MG tablet; Take 1 tablet (25 mg total) by mouth at bedtime.  Dispense: 30 tablet; Refill: 0  6. Tobacco abuse She reports that she is not ready to quit smoking. She maintains that smoking is helping her cope with stress. She is not coping well with the death of her son.    7. Other malaise and fatigue  - CBC with Differential   RTC: 1 month depression/Hypertension  Elham Fini M, FNP

## 2013-12-07 LAB — COMPLETE METABOLIC PANEL WITH GFR
ALK PHOS: 99 U/L (ref 39–117)
ALT: 24 U/L (ref 0–35)
AST: 18 U/L (ref 0–37)
Albumin: 4.3 g/dL (ref 3.5–5.2)
BILIRUBIN TOTAL: 0.2 mg/dL (ref 0.2–1.2)
BUN: 15 mg/dL (ref 6–23)
CO2: 22 mEq/L (ref 19–32)
CREATININE: 0.56 mg/dL (ref 0.50–1.10)
Calcium: 9.6 mg/dL (ref 8.4–10.5)
Chloride: 108 mEq/L (ref 96–112)
GFR, Est African American: >89 mL/min
GLUCOSE: 72 mg/dL (ref 70–99)
Potassium: 3.7 mEq/L (ref 3.5–5.3)
Sodium: 142 mEq/L (ref 135–145)
Total Protein: 7 g/dL (ref 6.0–8.3)

## 2013-12-08 LAB — CBC WITH DIFFERENTIAL/PLATELET
BASOS ABS: 0.1 10*3/uL (ref 0.0–0.1)
Basophils Relative: 1 % (ref 0–1)
Eosinophils Absolute: 0.2 10*3/uL (ref 0.0–0.7)
Eosinophils Relative: 4 % (ref 0–5)
HCT: 38.4 % (ref 36.0–46.0)
HEMOGLOBIN: 12.8 g/dL (ref 12.0–15.0)
LYMPHS PCT: 49 % — AB (ref 12–46)
Lymphs Abs: 2.6 10*3/uL (ref 0.7–4.0)
MCH: 27.9 pg (ref 26.0–34.0)
MCHC: 33.3 g/dL (ref 30.0–36.0)
MCV: 83.8 fL (ref 78.0–100.0)
MONOS PCT: 13 % — AB (ref 3–12)
Monocytes Absolute: 0.7 10*3/uL (ref 0.1–1.0)
NEUTROS ABS: 1.8 10*3/uL (ref 1.7–7.7)
Neutrophils Relative %: 33 % — ABNORMAL LOW (ref 43–77)
Platelets: 261 10*3/uL (ref 150–400)
RBC: 4.58 MIL/uL (ref 3.87–5.11)
RDW: 15.7 % — ABNORMAL HIGH (ref 11.5–15.5)
WBC: 5.4 10*3/uL (ref 4.0–10.5)

## 2013-12-13 ENCOUNTER — Encounter: Payer: Self-pay | Admitting: Family Medicine

## 2013-12-14 ENCOUNTER — Telehealth: Payer: Self-pay

## 2013-12-14 NOTE — Telephone Encounter (Signed)
Left message for patient to walk [facility] at [facility] during their walk-in clinic hours which are Monday-Friday 8:30am-12pm and 1-2:30pm on [address] In [place], [place].

## 2014-01-01 ENCOUNTER — Other Ambulatory Visit: Payer: Self-pay | Admitting: Family Medicine

## 2014-02-01 ENCOUNTER — Ambulatory Visit: Payer: No Typology Code available for payment source | Attending: Internal Medicine

## 2014-02-07 ENCOUNTER — Encounter (HOSPITAL_COMMUNITY): Payer: Self-pay | Admitting: Emergency Medicine

## 2014-02-07 ENCOUNTER — Emergency Department (HOSPITAL_COMMUNITY)
Admission: EM | Admit: 2014-02-07 | Discharge: 2014-02-07 | Disposition: A | Discharge status: Home / Self Care | Payer: No Typology Code available for payment source | Attending: Emergency Medicine | Admitting: Emergency Medicine

## 2014-02-07 ENCOUNTER — Emergency Department (HOSPITAL_COMMUNITY): Payer: No Typology Code available for payment source

## 2014-02-07 DIAGNOSIS — Z79899 Other long term (current) drug therapy: Secondary | ICD-10-CM | POA: Insufficient documentation

## 2014-02-07 DIAGNOSIS — Z720 Tobacco use: Secondary | ICD-10-CM | POA: Insufficient documentation

## 2014-02-07 DIAGNOSIS — M545 Low back pain: Secondary | ICD-10-CM | POA: Insufficient documentation

## 2014-02-07 DIAGNOSIS — J45909 Unspecified asthma, uncomplicated: Secondary | ICD-10-CM | POA: Insufficient documentation

## 2014-02-07 DIAGNOSIS — M62838 Other muscle spasm: Secondary | ICD-10-CM | POA: Insufficient documentation

## 2014-02-07 DIAGNOSIS — R51 Headache: Secondary | ICD-10-CM | POA: Insufficient documentation

## 2014-02-07 DIAGNOSIS — Z7951 Long term (current) use of inhaled steroids: Secondary | ICD-10-CM | POA: Insufficient documentation

## 2014-02-07 DIAGNOSIS — I10 Essential (primary) hypertension: Secondary | ICD-10-CM | POA: Insufficient documentation

## 2014-02-07 DIAGNOSIS — E785 Hyperlipidemia, unspecified: Secondary | ICD-10-CM | POA: Insufficient documentation

## 2014-02-07 DIAGNOSIS — M542 Cervicalgia: Secondary | ICD-10-CM | POA: Insufficient documentation

## 2014-02-07 DIAGNOSIS — J029 Acute pharyngitis, unspecified: Secondary | ICD-10-CM | POA: Insufficient documentation

## 2014-02-07 DIAGNOSIS — F419 Anxiety disorder, unspecified: Secondary | ICD-10-CM | POA: Insufficient documentation

## 2014-02-07 DIAGNOSIS — Z91040 Latex allergy status: Secondary | ICD-10-CM | POA: Insufficient documentation

## 2014-02-07 MED ORDER — METHOCARBAMOL 500 MG PO TABS: 500.0000 mg | ORAL_TABLET | Freq: Two times a day (BID) | ORAL | Status: DC

## 2014-02-07 MED ORDER — AMLODIPINE BESYLATE 10 MG PO TABS: 10.0000 mg | ORAL_TABLET | Freq: Every day | ORAL | Status: DC

## 2014-02-07 MED ORDER — PREDNISONE 10 MG PO TABS: 20.0000 mg | ORAL_TABLET | Freq: Every day | ORAL | Status: DC

## 2014-02-07 MED ORDER — HYDROCODONE-ACETAMINOPHEN 5-325 MG PO TABS: 1.0000 | ORAL_TABLET | Freq: Four times a day (QID) | ORAL | Status: DC | PRN

## 2014-02-07 NOTE — ED Notes (Signed)
Pt reports neck and lower back pain for 3 weeks. Hx muscle spasms, pt has been taking flexeril and ibuprofen for pain yet it did not help. denies hx of arthritis, per pt, she is going through menopause.

## 2014-02-07 NOTE — Discharge Instructions (Signed)

## 2014-02-07 NOTE — ED Provider Notes (Signed)
CSN: [id number]     Arrival date & time 02/07/14  1153 History  This chart was scribed for non-physician practitioner, Marlon Pel, PA-C, working with Enid Skeens, MD by Milly Jakob, ED Scribe. The patient was seen in room WTR7/WTR7. Patient's care was started at 12:19 PM.     Chief Complaint  Patient presents with  . Back Pain    lower/neck pain   The history is provided by the patient. No language interpreter was used.   HPI Comments: Deborah Knight is a 48 y.o. female with a history of HTN who presents to the Emergency Department complaining of gradually worsening left sided neck pain which began three weeks ago. She reports a constant, mild headache onset three days ago. She reports taking Flexeril, Tylenol, and Ibuprofen for her neck with no relief. She states that her pain is exacerbated by movement, and reports difficulty sleeping due to the pain. 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 She reports nasal congestion and a sore throat for the past few days. She states that she controls her BP with amlodipine, but that she ran out and has not taken it today. She states that she has a PCP, but they could not work her in today.  PCP: MATTHEWS,MICHELLE A., MD  Past Medical History  Diagnosis Date  . Hypertension   . Anxiety   . Seasonal allergies   . HLD (hyperlipidemia)   . Asthma    Past Surgical History  Procedure Laterality Date  . Cesarean section  1997  . Hernia repair  1989  . Tubal ligation  1997  . Shoulder arthroscopy     Family History  Problem Relation Age of Onset  . Asthma Father   . Asthma Sister   . Asthma Brother   . Asthma Maternal Grandmother   . Asthma Maternal Grandfather   . Asthma Paternal Grandmother   . Asthma Paternal Grandfather   . Cancer Father   . Hyperlipidemia Mother   . Hyperlipidemia Sister    History  Substance Use  Topics  . Smoking status: Current Some Day Smoker    Types: Cigars  . Smokeless tobacco: Not on file  . Alcohol Use: No   OB History    No data available     Review of Systems  HENT: Positive for congestion and sore throat.   Musculoskeletal: Positive for back pain and neck pain.  Neurological: Positive for headaches.  All other systems reviewed and are negative.  Allergies  Losartan; Shellfish-derived products; Toradol; and Latex  Home Medications   Prior to Admission medications   Medication Sig Start Date End Date Taking? Authorizing Provider  albuterol (PROVENTIL HFA;VENTOLIN HFA) 108 (90 BASE) MCG/ACT inhaler Inhale 2 puffs into the lungs every 6 (six) hours as needed for wheezing. Shortness of breath 08/23/13  Yes [name], MD  amitriptyline (ELAVIL) 25 MG tablet Take 1 tablet (25 mg total) by mouth at bedtime. 12/06/13  Yes [name], FNP  amLODipine (NORVASC) 10 MG tablet Take 1 tablet (10 mg total) by mouth daily. 08/23/13  Yes [name], MD  cetirizine (ZYRTEC) 10 MG tablet Take 1 tablet (10 mg total) by mouth daily. 08/23/13  Yes [name], MD  diphenhydrAMINE (BENADRYL) 12.5 MG/5ML elixir Take 5 mLs (12.5 mg total) by mouth 4 (four) times daily as needed for itching. 11/16/13  Yes  Massie MaroonLachina M Hollis, FNP  EPINEPHrine (EPIPEN) 0.3 mg/0.3 mL IJ SOAJ injection Inject 0.3 mg into the muscle once.   Yes Historical Provider, MD  ergocalciferol (VITAMIN D2) 50000 UNITS capsule Take 1 capsule (50,000 Units total) by mouth once a week. Patient taking differently: Take 50,000 Units by mouth once a week. sundays 08/24/13  Yes Altha HarmMichelle A Matthews, MD  fluticasone (FLONASE) 50 MCG/ACT nasal spray Place 2 sprays into both nostrils daily as needed (congestion). 08/23/13  Yes Altha HarmMichelle A Matthews, MD  fluticasone-salmeterol (ADVAIR HFA) [phone] MCG/ACT inhaler Inhale 2 puffs into the lungs 2 (two) times daily. 08/23/13  Yes Altha HarmMichelle A Matthews, MD  hydrALAZINE  (APRESOLINE) 25 MG tablet Take 1 tablet (25 mg total) by mouth 3 (three) times daily. 09/23/13  Yes Altha HarmMichelle A Matthews, MD  hydrochlorothiazide (HYDRODIURIL) 25 MG tablet Take 1 tablet (25 mg total) by mouth daily. 08/23/13  Yes Altha HarmMichelle A Matthews, MD  metoprolol tartrate (LOPRESSOR) 25 MG tablet Take 1 tablet (25 mg total) by mouth 2 (two) times daily. 10/04/13  Yes Altha HarmMichelle A Matthews, MD  montelukast (SINGULAIR) 10 MG tablet Take 1 tablet (10 mg total) by mouth at bedtime. 08/23/13  Yes Altha HarmMichelle A Matthews, MD  omeprazole (PRILOSEC) 20 MG capsule Take 1 capsule (20 mg total) by mouth daily. 08/23/13  Yes Altha HarmMichelle A Matthews, MD  potassium chloride SA (K-DUR,KLOR-CON) 20 MEQ tablet Take 1 tablet (20 mEq total) by mouth daily. 08/24/13  Yes Altha HarmMichelle A Matthews, MD  pravastatin (PRAVACHOL) 40 MG tablet Take 1 tablet (40 mg total) by mouth daily. 09/23/13  Yes Altha HarmMichelle A Matthews, MD  promethazine (PHENERGAN) 25 MG tablet Take 1 tablet (25 mg total) by mouth every 6 (six) hours as needed for nausea or vomiting. 03/04/13  Yes Mora BellmanHannah S Merrell, PA-C  sertraline (ZOLOFT) 50 MG tablet TAKE 1.5 TABLET (75 MG TOTAL) BY MOUTH DAILY. 09/23/13  Yes Altha HarmMichelle A Matthews, MD  triamcinolone cream (KENALOG) 0.1 % Apply 1 application topically 2 (two) times daily. 11/16/13  Yes Massie MaroonLachina M Hollis, FNP  amLODipine (NORVASC) 10 MG tablet Take 1 tablet (10 mg total) by mouth daily. 02/07/14   Dorthula Matasiffany G Kees Idrovo, PA-C  HYDROcodone-acetaminophen (NORCO/VICODIN) 5-325 MG per tablet Take 1-2 tablets by mouth every 6 (six) hours as needed for moderate pain or severe pain. 02/07/14   Kaena Santori Irine SealG Glynn Yepes, PA-C  methocarbamol (ROBAXIN) 500 MG tablet Take 1 tablet (500 mg total) by mouth 2 (two) times daily. 02/07/14   Armstead Heiland Irine SealG Yeraldi Fidler, PA-C  predniSONE (DELTASONE) 10 MG tablet Take 2 tablets (20 mg total) by mouth daily. 02/07/14   Dorthula Matasiffany G Albin Duckett, PA-C   Triage Vitals: BP 164/105 mmHg  Pulse 63  Temp(Src) 98.9 F (37.2 C) (Oral)  Resp  14  SpO2 99%  LMP 08/13/2012 Physical Exam  Constitutional: She is oriented to person, place, and time. She appears well-developed and well-nourished. No distress.  HENT:  Head: Normocephalic and atraumatic.  Eyes: Conjunctivae and EOM are normal.  Neck: Neck supple. Spinous process tenderness and muscular tenderness present. No rigidity. Decreased range of motion present. No tracheal deviation, no edema and no erythema present.  Equal strength to bilateral upper extremities. Physiologic grip strengths bilaterally.  Cardiovascular: Normal rate.   Pulmonary/Chest: Effort normal. No respiratory distress.  Neurological: She is alert and oriented to person, place, and time.  Skin: Skin is warm and dry.  Psychiatric: She has a normal mood and affect. Her behavior is normal.  Nursing note and vitals reviewed.  ED Course  Procedures (including critical care time) DIAGNOSTIC STUDIES: Oxygen Saturation is 99% on room air, normal by my interpretation.    COORDINATION OF CARE: 12:26 PM-Discussed treatment plan which includes amlodipine refill, and C-spine CT-scan with pt at bedside and pt agreed to plan.   Labs Review Labs Reviewed - No data to display  Imaging Review Ct Cervical Spine Wo Contrast  02/07/2014   CLINICAL DATA:  Patient had injury lifting child ; currently with bilateral upper extremity radicular type symptoms  EXAM: CT CERVICAL SPINE WITHOUT CONTRAST  TECHNIQUE: Multidetector CT imaging of the cervical spine was performed without intravenous contrast. Multiplanar CT image reconstructions were also generated.  COMPARISON:  None.  FINDINGS: There is no fracture or spondylolisthesis. Prevertebral soft tissues and predental space regions are normal.  At C2-3, there is no appreciable extradural defect.  At C3-4, there is no appreciable extradural defect.  At C4-5, there is minimal central disc bulging. There is no nerve root edema or effacement. No disc extrusion or stenosis.  At  C5-6, there is mild facet hypertrophy bilaterally. There is no nerve root edema or effacement. No disc extrusion or stenosis.  At C6-7, there is minimal facet hypertrophy bilaterally. No nerve root edema or effacement. No disc extrusion or stenosis.  At C7-T1, there is slight central disc bulging. No nerve root edema or effacement. No disc extrusion or stenosis.  At T1-2, there is no appreciable extradural defect.  IMPRESSION: No nerve root edema or effacement. No disc extrusion or stenosis. No fracture or spondylolisthesis.   Electronically Signed   By: Bretta BangWilliam  Woodruff M.D.   On: 02/07/2014 13:14     EKG Interpretation None      MDM   Final diagnoses:  Neck pain  Neck muscle spasm   Rx:   amLODipine (NORVASC) 10 MG tablet Take 1 tablet (10 mg total) by mouth daily. 30 tablet Dorthula Matasiffany G Tomicka Lover, PA-C    HYDROcodone-acetaminophen (NORCO/VICODIN) 5-325 MG per tablet Take 1-2 tablets by mouth every 6 (six) hours as needed for moderate pain or severe pain. 12 tablet Dorthula Matasiffany G Ronn Smolinsky, PA-C   methocarbamol (ROBAXIN) 500 MG tablet Take 1 tablet (500 mg total) by mouth 2 (two) times daily. 20 tablet Dorthula Matasiffany G Tracy Gerken, PA-C   predniSONE (DELTASONE) 10 MG tablet Take 2 tablets (20 mg total) by mouth daily. 21 tablet Dorthula Matasiffany G Gus Littler, PA-C     CT scan of the cervical spine is WNL. Etiology of patients neck pain is unclear. No weakness to upper or lower extremities. No hx of IV drug use or significant trauma.  48 y.o.Deborah Knight evaluation in the Emergency Department is complete. It has been determined that no acute conditions requiring further emergency intervention are present at this time. The patient/guardian have been advised of the diagnosis and plan. We have discussed signs and symptoms that warrant return to the ED, such as changes or worsening in symptoms.  Vital signs are stable at discharge. Filed Vitals:   02/07/14 1342  BP: 162/109  Pulse: 63  Temp:   Resp: 16     Patient/guardian has voiced understanding and agreed to follow-up with the PCP or specialist.  I personally performed the services described in this documentation, which was scribed in my presence. The recorded information has been reviewed and is accurate.     Dorthula Matasiffany G Sugey Trevathan, PA-C 02/07/14 2350  Enid SkeensJoshua M Zavitz, MD 02/09/14 [phone]

## 2014-04-08 ENCOUNTER — Ambulatory Visit (INDEPENDENT_AMBULATORY_CARE_PROVIDER_SITE_OTHER): Payer: Worker's Compensation | Admitting: Family Medicine

## 2014-04-08 ENCOUNTER — Encounter: Payer: Self-pay | Admitting: Family Medicine

## 2014-04-08 VITALS — BP 125/92 | HR 82 | Temp 98.2°F | Resp 16 | Ht 62.0 in | Wt 228.0 lb

## 2014-04-08 DIAGNOSIS — F329 Major depressive disorder, single episode, unspecified: Secondary | ICD-10-CM

## 2014-04-08 DIAGNOSIS — Z720 Tobacco use: Secondary | ICD-10-CM

## 2014-04-08 DIAGNOSIS — M545 Low back pain, unspecified: Secondary | ICD-10-CM

## 2014-04-08 DIAGNOSIS — J069 Acute upper respiratory infection, unspecified: Secondary | ICD-10-CM

## 2014-04-08 DIAGNOSIS — R52 Pain, unspecified: Secondary | ICD-10-CM

## 2014-04-08 DIAGNOSIS — I10 Essential (primary) hypertension: Secondary | ICD-10-CM

## 2014-04-08 DIAGNOSIS — R059 Cough, unspecified: Secondary | ICD-10-CM

## 2014-04-08 DIAGNOSIS — F32A Depression, unspecified: Secondary | ICD-10-CM

## 2014-04-08 DIAGNOSIS — R05 Cough: Secondary | ICD-10-CM

## 2014-04-08 DIAGNOSIS — J4521 Mild intermittent asthma with (acute) exacerbation: Secondary | ICD-10-CM

## 2014-04-08 DIAGNOSIS — R6883 Chills (without fever): Secondary | ICD-10-CM

## 2014-04-08 MED ORDER — BENZONATATE 100 MG PO CAPS: 100.0000 mg | ORAL_CAPSULE | Freq: Three times a day (TID) | ORAL | Status: DC | PRN

## 2014-04-08 MED ORDER — AZITHROMYCIN 250 MG PO TABS: ORAL_TABLET | ORAL | Status: DC

## 2014-04-08 MED ORDER — ALBUTEROL SULFATE HFA 108 (90 BASE) MCG/ACT IN AERS: 2.0000 | INHALATION_SPRAY | Freq: Four times a day (QID) | RESPIRATORY_TRACT | Status: DC | PRN

## 2014-04-08 NOTE — Patient Instructions (Signed)
Upper Respiratory Infection, Adult °An upper respiratory infection (URI) is also sometimes known as the common cold. The upper respiratory tract includes the nose, sinuses, throat, trachea, and bronchi. Bronchi are the airways leading to the lungs. Most people improve within 1 week, but symptoms can last up to 2 weeks. A residual cough may last even longer.  °CAUSES °Many different viruses can infect the tissues lining the upper respiratory tract. The tissues become irritated and inflamed and often become very moist. Mucus production is also common. A cold is contagious. You can easily spread the virus to others by oral contact. This includes kissing, sharing a glass, coughing, or sneezing. Touching your mouth or nose and then touching a surface, which is then touched by another person, can also spread the virus. °SYMPTOMS  °Symptoms typically develop 1 to 3 days after you come in contact with a cold virus. Symptoms vary from person to person. They may include: °· Runny nose. °· Sneezing. °· Nasal congestion. °· Sinus irritation. °· Sore throat. °· Loss of voice (laryngitis). °· Cough. °· Fatigue. °· Muscle aches. °· Loss of appetite. °· Headache. °· Low-grade fever. °DIAGNOSIS  °You might diagnose your own cold based on familiar symptoms, since most people get a cold 2 to 3 times a year. Your caregiver can confirm this based on your exam. Most importantly, your caregiver can check that your symptoms are not due to another disease such as strep throat, sinusitis, pneumonia, asthma, or epiglottitis. Blood tests, throat tests, and X-rays are not necessary to diagnose a common cold, but they may sometimes be helpful in excluding other more serious diseases. Your caregiver will decide if any further tests are required. °RISKS AND COMPLICATIONS  °You may be at risk for a more severe case of the common cold if you smoke cigarettes, have chronic heart disease (such as heart failure) or lung disease (such as asthma), or if  you have a weakened immune system. The very young and very old are also at risk for more serious infections. Bacterial sinusitis, middle ear infections, and bacterial pneumonia can complicate the common cold. The common cold can worsen asthma and chronic obstructive pulmonary disease (COPD). Sometimes, these complications can require emergency medical care and may be life-threatening. °PREVENTION  °The best way to protect against getting a cold is to practice good hygiene. Avoid oral or hand contact with people with cold symptoms. Wash your hands often if contact occurs. There is no clear evidence that vitamin C, vitamin E, echinacea, or exercise reduces the chance of developing a cold. However, it is always recommended to get plenty of rest and practice good nutrition. °TREATMENT  °Treatment is directed at relieving symptoms. There is no cure. Antibiotics are not effective, because the infection is caused by a virus, not by bacteria. Treatment may include: °· Increased fluid intake. Sports drinks offer valuable electrolytes, sugars, and fluids. °· Breathing heated mist or steam (vaporizer or shower). °· Eating chicken soup or other clear broths, and maintaining good nutrition. °· Getting plenty of rest. °· Using gargles or lozenges for comfort. °· Controlling fevers with ibuprofen or acetaminophen as directed by your caregiver. °· Increasing usage of your inhaler if you have asthma. °Zinc gel and zinc lozenges, taken in the first 24 hours of the common cold, can shorten the duration and lessen the severity of symptoms. Pain medicines may help with fever, muscle aches, and throat pain. A variety of non-prescription medicines are available to treat congestion and runny nose. Your caregiver   can make recommendations and may suggest nasal or lung inhalers for other symptoms.  °HOME CARE INSTRUCTIONS  °· Only take over-the-counter or prescription medicines for pain, discomfort, or fever as directed by your  caregiver. °· Use a warm mist humidifier or inhale steam from a shower to increase air moisture. This may keep secretions moist and make it easier to breathe. °· Drink enough water and fluids to keep your urine clear or pale yellow. °· Rest as needed. °· Return to work when your temperature has returned to normal or as your caregiver advises. You may need to stay home longer to avoid infecting others. You can also use a face mask and careful hand washing to prevent spread of the virus. °SEEK MEDICAL CARE IF:  °· After the first few days, you feel you are getting worse rather than better. °· You need your caregiver's advice about medicines to control symptoms. °· You develop chills, worsening shortness of breath, or brown or red sputum. These may be signs of pneumonia. °· You develop yellow or brown nasal discharge or pain in the face, especially when you bend forward. These may be signs of sinusitis. °· You develop a fever, swollen neck glands, pain with swallowing, or white areas in the back of your throat. These may be signs of strep throat. °SEEK IMMEDIATE MEDICAL CARE IF:  °· You have a fever. °· You develop severe or persistent headache, ear pain, sinus pain, or chest pain. °· You develop wheezing, a prolonged cough, cough up blood, or have a change in your usual mucus (if you have chronic lung disease). °· You develop sore muscles or a stiff neck. °Document Released: 08/21/2000 Document Revised: 05/20/2011 Document Reviewed: 06/02/2013 °[employer]® Patient Information ©2015 [employer]. This information is not intended to replace advice given to you by your health care provider. Make sure you discuss any questions you have with your health care provider. °Asthma °Asthma is a recurring condition in which the airways tighten and narrow. Asthma can make it difficult to breathe. It can cause coughing, wheezing, and shortness of breath. Asthma episodes, also called asthma attacks, range from minor to  life-threatening. Asthma cannot be cured, but medicines and lifestyle changes can help control it. °CAUSES °Asthma is believed to be caused by inherited (genetic) and environmental factors, but its exact cause is unknown. Asthma may be triggered by allergens, lung infections, or irritants in the air. Asthma triggers are different for each person. Common triggers include:  °· Animal dander. °· Dust mites. °· Cockroaches. °· Pollen from trees or grass. °· Mold. °· Smoke. °· Air pollutants such as dust, household cleaners, hair sprays, aerosol sprays, paint fumes, strong chemicals, or strong odors. °· Cold air, weather changes, and winds (which increase molds and pollens in the air). °· Strong emotional expressions such as crying or laughing hard. °· Stress. °· Certain medicines (such as aspirin) or types of drugs (such as beta-blockers). °· Sulfites in foods and drinks. Foods and drinks that may contain sulfites include dried fruit, potato chips, and sparkling grape juice. °· Infections or inflammatory conditions such as the flu, a cold, or an inflammation of the nasal membranes (rhinitis). °· Gastroesophageal reflux disease (GERD). °· Exercise or strenuous activity. °SYMPTOMS °Symptoms may occur immediately after asthma is triggered or many hours later. Symptoms include: °· Wheezing. °· Excessive nighttime or early morning coughing. °· Frequent or severe coughing with a common cold. °· Chest tightness. °· Shortness of breath. °DIAGNOSIS  °The diagnosis of asthma is   made by a review of your medical history and a physical exam. Tests may also be performed. These may include: °· Lung function studies. These tests show how much air you breathe in and out. °· Allergy tests. °· Imaging tests such as X-rays. °TREATMENT  °Asthma cannot be cured, but it can usually be controlled. Treatment involves identifying and avoiding your asthma triggers. It also involves medicines. There are 2 classes of medicine used for asthma  treatment:  °· Controller medicines. These prevent asthma symptoms from occurring. They are usually taken every day. °· Reliever or rescue medicines. These quickly relieve asthma symptoms. They are used as needed and provide short-term relief. °Your health care provider will help you create an asthma action plan. An asthma action plan is a written plan for managing and treating your asthma attacks. It includes a list of your asthma triggers and how they may be avoided. It also includes information on when medicines should be taken and when their dosage should be changed. An action plan may also involve the use of a device called a peak flow meter. A peak flow meter measures how well the lungs are working. It helps you monitor your condition. °HOME CARE INSTRUCTIONS  °· Take medicines only as directed by your health care provider. Speak with your health care provider if you have questions about how or when to take the medicines. °· Use a peak flow meter as directed by your health care provider. Record and keep track of readings. °· Understand and use the action plan to help minimize or stop an asthma attack without needing to seek medical care. °· Control your home environment in the following ways to help prevent asthma attacks: °¨ Do not smoke. Avoid being exposed to secondhand smoke. °¨ Change your heating and air conditioning filter regularly. °¨ Limit your use of fireplaces and wood stoves. °¨ Get rid of pests (such as roaches and mice) and their droppings. °¨ Throw away plants if you see mold on them. °¨ Clean your floors and dust regularly. Use unscented cleaning products. °¨ Try to have someone else vacuum for you regularly. Stay out of rooms while they are being vacuumed and for a short while afterward. If you vacuum, use a dust mask from a hardware store, a double-layered or microfilter vacuum cleaner bag, or a vacuum cleaner with a HEPA filter. °¨ Replace carpet with wood, tile, or vinyl flooring. Carpet  can trap dander and dust. °¨ Use allergy-proof pillows, mattress covers, and box spring covers. °¨ Wash bed sheets and blankets every week in hot water and dry them in a dryer. °¨ Use blankets that are made of polyester or cotton. °¨ Clean bathrooms and kitchens with bleach. If possible, have someone repaint the walls in these rooms with mold-resistant paint. Keep out of the rooms that are being cleaned and painted. °¨ Wash hands frequently. °SEEK MEDICAL CARE IF:  °· You have wheezing, shortness of breath, or a cough even if taking medicine to prevent attacks. °· The colored mucus you cough up (sputum) is thicker than usual. °· Your sputum changes from clear or white to yellow, green, gray, or bloody. °· You have any problems that may be related to the medicines you are taking (such as a rash, itching, swelling, or trouble breathing). °· You are using a reliever medicine more than 2-3 times per week. °· Your peak flow is still at 50-79% of your personal best after following your action plan for 1 hour. °· You   have a fever. °SEEK IMMEDIATE MEDICAL CARE IF:  °· You seem to be getting worse and are unresponsive to treatment during an asthma attack. °· You are short of breath even at rest. °· You get short of breath when doing very little physical activity. °· You have difficulty eating, drinking, or talking due to asthma symptoms. °· You develop chest pain. °· You develop a fast heartbeat. °· You have a bluish color to your lips or fingernails. °· You are light-headed, dizzy, or faint. °· Your peak flow is less than 50% of your personal best. °MAKE SURE YOU:  °· Understand these instructions. °· Will watch your condition. °· Will get help right away if you are not doing well or get worse. °Document Released: 02/25/2005 Document Revised: 07/12/2013 Document Reviewed: 09/24/2012 °ExitCare® Patient Information ©2015 [employer]. This information is not intended to replace advice given to you by your health care  provider. Make sure you discuss any questions you have with your health care provider. ° °

## 2014-04-08 NOTE — Progress Notes (Signed)
Subjective:    Patient ID: Deborah Knight, female    DOB: March 02, 1966, 49 y.o.   MRN: [id number]  HPI  [name] is a 49 year old female with a history of hypertension and mild intermittent asthma that presents with a 1 week history of a cough, congestion, sore throat, and runny nose.  Symptoms include congestion, cough, irritability, sore throat and swollen glands. Symptoms have been increasing since initial onset.  She also c/o achiness and cough described as nonproductive and worsening over time for the past few  days .  She is not drinking much. She states that she used albuterol inhaler this am for coughing with minimal relief. She states that she was reluctant to utilize OTC symptom relief due to history of high blood pressure. She also maintains that symptoms are worsened by cigarette smoking.   Patient is also complaining of low back pain.  Symptoms have been present for several months and include pain in lumbar sacral area (midline) (aching in character; 4/10 in severity). Patient has not identified an Initial inciting event. Symptoms are worst in the am, she reports that she often feels "stiff" in the am. Alleviating factors identifiable by patient are standing and walking. Exacerbating factors identifiable by patient are bending backwards, bending forwards, bending sideways and recumbency. Treatments so far initiated by patient include ibuprofen, stretching, and heat with minimal relief.  Past Medical History  Diagnosis Date  . Hypertension   . Anxiety   . Seasonal allergies   . HLD (hyperlipidemia)   . Asthma      Review of Systems  Constitutional: Positive for chills and fatigue. Negative for fever.  HENT: Positive for congestion, postnasal drip and sore throat.   Eyes: Negative.   Respiratory: Positive for cough and chest tightness (occasionally).   Cardiovascular: Negative.   Gastrointestinal: Negative.   Endocrine: Negative.   Genitourinary: Negative.    Musculoskeletal: Positive for back pain.  Skin: Negative.   Allergic/Immunologic: Negative.   Neurological: Negative.   Hematological: Negative.   Psychiatric/Behavioral: Negative.        Patient is currently followed by a therapist for depression, controlled on current medication regimen.        Objective:   Physical Exam  Constitutional: She is oriented to person, place, and time. She appears well-developed and well-nourished. She appears distressed.  HENT:  Head: Normocephalic and atraumatic.  Right Ear: External ear normal.  Left Ear: External ear normal.  Nose: Mucosal edema and rhinorrhea present.  Mouth/Throat: Posterior oropharyngeal erythema present.  Eyes: Conjunctivae and EOM are normal. Pupils are equal, round, and reactive to light.  Neck: Normal range of motion. Neck supple.  Cardiovascular: Normal rate, regular rhythm, normal heart sounds and intact distal pulses.   Pulmonary/Chest: Effort normal and breath sounds normal.  Abdominal: Soft. Bowel sounds are normal.  Musculoskeletal:       Lumbar back: She exhibits decreased range of motion and pain. She exhibits no tenderness, no swelling and no edema.  Neurological: She is alert and oriented to person, place, and time. She has normal reflexes.  Skin: Skin is warm and dry.  Psychiatric: She has a normal mood and affect. Her behavior is normal. Judgment and thought content normal.      BP 125/92 mmHg  Pulse 82  Temp(Src) 98.2 F (36.8 C) (Oral)  Resp 16  Ht  (1.575 m)  Wt 228 lb (103.42 kg)  BMI 41.69 kg/m2  LMP 08/13/2012    Assessment &  Plan:  1. Acute upper respiratory infection I will start antibiotic for upper respiratory infection. Recommend increase fluid intake, rest, and handwashing. - azithromycin (ZITHROMAX) 250 MG tablet; Take 500 mg day 1; take 250 mg days 2-5  Dispense: 6 tablet; Refill: 0  2. Asthma with acute exacerbation, mild intermittent Patient to continue Advair HFA 2 puffs BID  as previously ordered. Discussed the importance of smoking cessation with asthma. Reviewed the use of albuterol and advair. - albuterol (PROVENTIL HFA;VENTOLIN HFA) 108 (90 BASE) MCG/ACT inhaler; Inhale 2 puffs into the lungs every 6 (six) hours as needed for wheezing. Shortness of breath  Dispense: 1 Inhaler; Refill: 2  3. Cough - benzonatate (TESSALON) 100 MG capsule; Take 1 capsule (100 mg total) by mouth 3 (three) times daily as needed for cough.  Dispense: 30 capsule; Refill: 0  4. Chills (without fever) - Influenza A/B  5. Body aches  - Influenza A/B  6. Midline low back pain without sciatica Discussed the role of obesity and depression in back pain. Patient was previously evaluated in the emergency department for increased back pain. I reviewed CT, was negative for nerve root edema or effacement. Patient is to continue back exercises and heat therapy as previously discussed. I recommend OTC Tylenol 500 mg every 6 hours for mild to moderate pain.  7. Essential hypertension At goal on current medication regimen. I will continue current medications  8. Depression Controlled on Zoloft. She denies suicidal or homicidal ideation.   9. Tobacco abuse Smoking cessation instruction/counseling given:  counseled patient on the dangers of tobacco use, advised patient to stop smoking, and reviewed strategies to maximize success   RTC: Follow up with Dr. Ashley RoyaltyMatthews for hypertension, hyperlipidemia as previously scheduled    Massie MaroonHollis,Danny Zimny M, FNP

## 2014-05-02 ENCOUNTER — Ambulatory Visit (INDEPENDENT_AMBULATORY_CARE_PROVIDER_SITE_OTHER): Payer: Self-pay | Admitting: Family Medicine

## 2014-05-02 VITALS — BP 124/81 | HR 76 | Temp 98.3°F | Resp 16 | Ht 62.0 in | Wt 231.0 lb

## 2014-05-02 DIAGNOSIS — G4719 Other hypersomnia: Secondary | ICD-10-CM

## 2014-05-02 DIAGNOSIS — F32A Depression, unspecified: Secondary | ICD-10-CM

## 2014-05-02 DIAGNOSIS — R0683 Snoring: Secondary | ICD-10-CM

## 2014-05-02 DIAGNOSIS — F329 Major depressive disorder, single episode, unspecified: Secondary | ICD-10-CM

## 2014-05-02 DIAGNOSIS — I10 Essential (primary) hypertension: Secondary | ICD-10-CM

## 2014-05-02 DIAGNOSIS — G4730 Sleep apnea, unspecified: Secondary | ICD-10-CM

## 2014-05-02 DIAGNOSIS — Z720 Tobacco use: Secondary | ICD-10-CM

## 2014-05-02 DIAGNOSIS — R29818 Other symptoms and signs involving the nervous system: Secondary | ICD-10-CM

## 2014-05-02 NOTE — Progress Notes (Signed)
Subjective:    Patient ID: Deborah Knight, female    DOB: 1966-03-08, 49 y.o.   MRN: [id number]  HPI [name], a 49 year old patient with hypertension, depression, and asthma, present with possible obstructive sleep apnea.  Patient generally gets 4 or 5 hours of sleep per night, and states they generally have poor sleep quality. Snoring of moderate severity present. Apneic episodes are present. Nasal obstruction is not present.  Patient has never had tonsillectomy.    She currently denies headache, weakness, chest pain, shortness of breath, nausea, vomiting, or diarrhea.   Past Medical History  Diagnosis Date  . Hypertension   . Anxiety   . Seasonal allergies   . HLD (hyperlipidemia)   . Asthma    History   Social History  . Marital Status: Single    Spouse Name: N/A  . Number of Children: N/A  . Years of Education: N/A   Occupational History  . Not on file.   Social History Main Topics  . Smoking status: Current Some Day Smoker    Types: Cigars  . Smokeless tobacco: Not on file  . Alcohol Use: No  . Drug Use: No  . Sexual Activity: Yes    Birth Control/ Protection: Condom, None   Other Topics Concern  . Not on file   Social History Narrative   Employed as a Education officer, environmental.    Review of Systems  Constitutional: Positive for fatigue.  HENT: Negative.   Eyes: Negative.   Respiratory: Positive for apnea and wheezing (occasional wheezing). Negative for shortness of breath.   Cardiovascular: Negative.   Gastrointestinal: Negative.   Endocrine: Negative.   Genitourinary: Negative.   Musculoskeletal: Negative.   Skin: Negative.   Allergic/Immunologic: Negative.   Neurological: Negative.   Hematological: Negative.   Psychiatric/Behavioral: Negative.        Objective:   Physical Exam  Constitutional: She is oriented to person, place, and time.  HENT:  Head: Normocephalic and atraumatic.  Right Ear: External ear normal.  Left Ear:  External ear normal.  Mouth/Throat: Oropharynx is clear and moist.  Eyes: Conjunctivae are normal. Pupils are equal, round, and reactive to light.  Neck: Normal range of motion. Neck supple.  Cardiovascular: Normal rate, regular rhythm, normal heart sounds and intact distal pulses.   Pulmonary/Chest: Effort normal and breath sounds normal.  Neurological: She is alert and oriented to person, place, and time. She has normal reflexes.  Skin: Skin is warm and dry.  Psychiatric: She has a normal mood and affect. Her behavior is normal. Judgment and thought content normal.         BP 124/81 mmHg  Pulse 76  Temp(Src) 98.3 F (36.8 C) (Oral)  Resp 16  Ht  (1.575 m)  Wt 231 lb (104.781 kg)  BMI 42.24 kg/m2  LMP 08/13/2012 Assessment & Plan:  1. Suspected sleep apnea Patient has witnessed sleep apnea, she often naps during the day. Her son has witnessed apneic episodes and she has been snoring loudly for years.  - Split night study; Future  2. Excessive daytime sleepiness Patient states that she is often sleepy and will nap during the day  3.  Morbid obesity Patient is not following a diet or exercise regimen.   4. Snoring Patient reports that her son has witnessed her snoring.   5. Essential hypertension At goal on current medication regimen. I will continue current medications  6. Depression Controlled on current medication regimen. Patient is currently  followed by a psychiatrist. She denies suicidal or homicidal ideations.   7. Tobacco abuse Smoking cessation instruction/counseling given:  counseled patient on the dangers of tobacco use, advised patient to stop smoking, and reviewed strategies to maximize success     Massie MaroonHollis,Deborah Hendriks M, FNP

## 2014-05-04 ENCOUNTER — Encounter: Payer: Self-pay | Admitting: Family Medicine

## 2014-05-10 ENCOUNTER — Ambulatory Visit (HOSPITAL_BASED_OUTPATIENT_CLINIC_OR_DEPARTMENT_OTHER): Payer: Self-pay | Attending: Family Medicine | Admitting: Radiology

## 2014-05-10 DIAGNOSIS — R0683 Snoring: Secondary | ICD-10-CM | POA: Insufficient documentation

## 2014-05-10 DIAGNOSIS — G4730 Sleep apnea, unspecified: Secondary | ICD-10-CM | POA: Insufficient documentation

## 2014-05-10 DIAGNOSIS — R29818 Other symptoms and signs involving the nervous system: Secondary | ICD-10-CM

## 2014-05-13 ENCOUNTER — Other Ambulatory Visit: Payer: Self-pay | Admitting: Internal Medicine

## 2014-05-13 ENCOUNTER — Other Ambulatory Visit: Payer: Self-pay

## 2014-05-13 DIAGNOSIS — I10 Essential (primary) hypertension: Secondary | ICD-10-CM

## 2014-05-13 MED ORDER — AMLODIPINE BESYLATE 10 MG PO TABS: 10.0000 mg | ORAL_TABLET | Freq: Every day | ORAL | Status: DC

## 2014-05-14 DIAGNOSIS — R0683 Snoring: Secondary | ICD-10-CM

## 2014-05-14 DIAGNOSIS — G4730 Sleep apnea, unspecified: Secondary | ICD-10-CM

## 2014-05-14 NOTE — Sleep Study (Signed)
   NAME: Deborah SalmLinda S [name] DATE OF BIRTH:  07/03/1965 MEDICAL RECORD NUMBER [id number]  LOCATION: [facility]  PHYSICIAN: Wenda Vanschaick D  DATE OF STUDY: 05/10/2014  SLEEP STUDY TYPE: Nocturnal Polysomnogram               REFERRING PHYSICIAN: Massie MaroonHollis, Lachina M, FNP  INDICATION FOR STUDY: Hypersomnia with sleep apnea  EPWORTH SLEEPINESS SCORE:   16/24 HEIGHT:   5 feet 2 inches WEIGHT:   230 pounds    BMI 42 NECK SIZE: 13.5 in.  MEDICATIONS: Charted for review  SLEEP ARCHITECTURE: Total sleep time 287.5 minutes with sleep efficiency 78.1%. Stage I was 7.5%, stage II 58.3%, stage III 14.4%, REM 19.8% of total sleep time. Sleep latency 28.5 minutes, REM latency 148 minutes, awake after sleep onset 52 minutes, arousal index 16.5, bedtime medication: Robaxin, ranitidine  RESPIRATORY DATA: Apnea hypopnea index (AHI) 4.4 per hour. 21 total events scored, all as hypopneas. Events were more frequent while nonsupine. REM AHI 16.8 per hour. There were not enough early events to meet requirements for split protocol CPAP titration.  OXYGEN DATA: Mild to sometimes loud snoring with oxygen desaturation to a nadir of 87% and mean saturation 94.2% on room air.  CARDIAC DATA: Normal sinus rhythm  MOVEMENT/PARASOMNIA: No significant movement disturbance, bathroom 1  IMPRESSION/ RECOMMENDATION:   1) Occasional respiratory events with sleep disturbance, within normal limits. AHI 4.4 per hour. The normal range for adults is an AHI from 0-5 events per hour. Mild to sometimes loud snoring with oxygen desaturation to a nadir of 87% and mean saturation 94.2% on room air.   Waymon BudgeYOUNG,Conrad Zajkowski D Diplomate, American Board of Sleep Medicine  ELECTRONICALLY SIGNED ON:  05/14/2014, 3:17 PM [name] PH: [phone]   FX: [phone] ACCREDITED BY THE [employer]ICAN [employer] OF SLEEP MEDICINE

## 2014-05-19 ENCOUNTER — Ambulatory Visit: Payer: Self-pay | Admitting: Family Medicine

## 2014-05-26 ENCOUNTER — Ambulatory Visit (INDEPENDENT_AMBULATORY_CARE_PROVIDER_SITE_OTHER): Payer: Worker's Compensation | Admitting: Family Medicine

## 2014-05-26 VITALS — BP 148/87 | HR 91 | Temp 98.6°F | Resp 16 | Ht 62.0 in | Wt 224.0 lb

## 2014-05-26 DIAGNOSIS — M62830 Muscle spasm of back: Secondary | ICD-10-CM

## 2014-05-26 DIAGNOSIS — Z720 Tobacco use: Secondary | ICD-10-CM

## 2014-05-26 DIAGNOSIS — F32A Depression, unspecified: Secondary | ICD-10-CM

## 2014-05-26 DIAGNOSIS — F329 Major depressive disorder, single episode, unspecified: Secondary | ICD-10-CM

## 2014-05-26 DIAGNOSIS — G8929 Other chronic pain: Secondary | ICD-10-CM

## 2014-05-26 DIAGNOSIS — M549 Dorsalgia, unspecified: Secondary | ICD-10-CM

## 2014-05-26 NOTE — Patient Instructions (Signed)
Back Pain, Adult Low back pain is very common. About 1 in 5 people have back pain.The cause of low back pain is rarely dangerous. The pain often gets better over time.About half of people with a sudden onset of back pain feel better in just 2 weeks. About 8 in 10 people feel better by 6 weeks.  CAUSES Some common causes of back pain include:  Strain of the muscles or ligaments supporting the spine.  Wear and tear (degeneration) of the spinal discs.  Arthritis.  Direct injury to the back. DIAGNOSIS Most of the time, the direct cause of low back pain is not known.However, back pain can be treated effectively even when the exact cause of the pain is unknown.Answering your caregiver's questions about your overall health and symptoms is one of the most accurate ways to make sure the cause of your pain is not dangerous. If your caregiver needs more information, he or she may order lab work or imaging tests (X-rays or MRIs).However, even if imaging tests show changes in your back, this usually does not require surgery. HOME CARE INSTRUCTIONS For many people, back pain returns.Since low back pain is rarely dangerous, it is often a condition that people can learn to manageon their own.   Remain active. It is stressful on the back to sit or stand in one place. Do not sit, drive, or stand in one place for more than 30 minutes at a time. Take short walks on level surfaces as soon as pain allows.Try to increase the length of time you walk each day.  Do not stay in bed.Resting more than 1 or 2 days can delay your recovery.  Do not avoid exercise or work.Your body is made to move.It is not dangerous to be active, even though your back may hurt.Your back will likely heal faster if you return to being active before your pain is gone.  Pay attention to your body when you bend and lift. Many people have less discomfortwhen lifting if they bend their knees, keep the load close to their bodies,and  avoid twisting. Often, the most comfortable positions are those that put less stress on your recovering back.  Find a comfortable position to sleep. Use a firm mattress and lie on your side with your knees slightly bent. If you lie on your back, put a pillow under your knees.  Only take over-the-counter or prescription medicines as directed by your caregiver. Over-the-counter medicines to reduce pain and inflammation are often the most helpful.Your caregiver may prescribe muscle relaxant drugs.These medicines help dull your pain so you can more quickly return to your normal activities and healthy exercise.  Put ice on the injured area.  Put ice in a plastic bag.  Place a towel between your skin and the bag.  Leave the ice on for 15-20 minutes, 03-04 times a day for the first 2 to 3 days. After that, ice and heat may be alternated to reduce pain and spasms.  Ask your caregiver about trying back exercises and gentle massage. This may be of some benefit.  Avoid feeling anxious or stressed.Stress increases muscle tension and can worsen back pain.It is important to recognize when you are anxious or stressed and learn ways to manage it.Exercise is a great option. SEEK MEDICAL CARE IF:  You have pain that is not relieved with rest or medicine.  You have pain that does not improve in 1 week.  You have new symptoms.  You are generally not feeling well. SEEK   IMMEDIATE MEDICAL CARE IF:   You have pain that radiates from your back into your legs.  You develop new bowel or bladder control problems.  You have unusual weakness or numbness in your arms or legs.  You develop nausea or vomiting.  You develop abdominal pain.  You feel faint. Document Released: 02/25/2005 Document Revised: 08/27/2011 Document Reviewed: 06/29/2013 [facility] Patient Information 2015 [place], [place]. This information is not intended to replace advice given to you by your health care provider. Make sure you  discuss any questions you have with your health care provider. Back Pain, Adult Low back pain is very common. About 1 in 5 people have back pain.The cause of low back pain is rarely dangerous. The pain often gets better over time.About half of people with a sudden onset of back pain feel better in just 2 weeks. About 8 in 10 people feel better by 6 weeks.  CAUSES Some common causes of back pain include:  Strain of the muscles or ligaments supporting the spine.  Wear and tear (degeneration) of the spinal discs.  Arthritis.  Direct injury to the back. DIAGNOSIS Most of the time, the direct cause of low back pain is not known.However, back pain can be treated effectively even when the exact cause of the pain is unknown.Answering your caregiver's questions about your overall health and symptoms is one of the most accurate ways to make sure the cause of your pain is not dangerous. If your caregiver needs more information, he or she may order lab work or imaging tests (X-rays or MRIs).However, even if imaging tests show changes in your back, this usually does not require surgery. HOME CARE INSTRUCTIONS For many people, back pain returns.Since low back pain is rarely dangerous, it is often a condition that people can learn to [facility] their own.   Remain active. It is stressful on the back to sit or stand in one place. Do not sit, drive, or stand in one place for more than 30 minutes at a time. Take short walks on level surfaces as soon as pain allows.Try to increase the length of time you walk each day.  Do not stay in bed.Resting more than 1 or 2 days can delay your recovery.  Do not avoid exercise or work.Your body is made to move.It is not dangerous to be active, even though your back may hurt.Your back will likely heal faster if you return to being active before your pain is gone.  Pay attention to your body when you bend and lift. Many people have less discomfortwhen lifting if  they bend their knees, keep the load close to their bodies,and avoid twisting. Often, the most comfortable positions are those that put less stress on your recovering back.  Find a comfortable position to sleep. Use a firm mattress and lie on your side with your knees slightly bent. If you lie on your back, put a pillow under your knees.  Only take over-the-counter or prescription medicines as directed by your caregiver. Over-the-counter medicines to reduce pain and inflammation are often the most helpful.Your caregiver may prescribe muscle relaxant drugs.These medicines help dull your pain so you can more quickly return to your normal activities and healthy exercise.  Put ice on the injured area.  Put ice in a plastic bag.  Place a towel between your skin and the bag.  Leave the ice on for 15-20 minutes, 03-04 times a day for the first 2 to 3 days. After that, ice and heat may  be alternated to reduce pain and spasms.  Ask your caregiver about trying back exercises and gentle massage. This may be of some benefit.  Avoid feeling anxious or stressed.Stress increases muscle tension and can worsen back pain.It is important to recognize when you are anxious or stressed and learn ways to manage it.Exercise is a great option. SEEK MEDICAL CARE IF:  You have pain that is not relieved with rest or medicine.  You have pain that does not improve in 1 week.  You have new symptoms.  You are generally not feeling well. SEEK IMMEDIATE MEDICAL CARE IF:   You have pain that radiates from your back into your legs.  You develop new bowel or bladder control problems.  You have unusual weakness or numbness in your arms or legs.  You develop nausea or vomiting.  You develop abdominal pain.  You feel faint. Document Released: 02/25/2005 Document Revised: 08/27/2011 Document Reviewed: 06/29/2013 [facility] Patient Information 2015 [place], [place]. This information is not intended to replace  advice given to you by your health care provider. Make sure you discuss any questions you have with your health care provider. Back Pain, Adult Low back pain is very common. About 1 in 5 people have back pain.The cause of low back pain is rarely dangerous. The pain often gets better over time.About half of people with a sudden onset of back pain feel better in just 2 weeks. About 8 in 10 people feel better by 6 weeks.  CAUSES Some common causes of back pain include:  Strain of the muscles or ligaments supporting the spine.  Wear and tear (degeneration) of the spinal discs.  Arthritis.  Direct injury to the back. DIAGNOSIS Most of the time, the direct cause of low back pain is not known.However, back pain can be treated effectively even when the exact cause of the pain is unknown.Answering your caregiver's questions about your overall health and symptoms is one of the most accurate ways to make sure the cause of your pain is not dangerous. If your caregiver needs more information, he or she may order lab work or imaging tests (X-rays or MRIs).However, even if imaging tests show changes in your back, this usually does not require surgery. HOME CARE INSTRUCTIONS For many people, back pain returns.Since low back pain is rarely dangerous, it is often a condition that people can learn to [facility] their own.   Remain active. It is stressful on the back to sit or stand in one place. Do not sit, drive, or stand in one place for more than 30 minutes at a time. Take short walks on level surfaces as soon as pain allows.Try to increase the length of time you walk each day.  Do not stay in bed.Resting more than 1 or 2 days can delay your recovery.  Do not avoid exercise or work.Your body is made to move.It is not dangerous to be active, even though your back may hurt.Your back will likely heal faster if you return to being active before your pain is gone.  Pay attention to your body when you  bend and lift. Many people have less discomfortwhen lifting if they bend their knees, keep the load close to their bodies,and avoid twisting. Often, the most comfortable positions are those that put less stress on your recovering back.  Find a comfortable position to sleep. Use a firm mattress and lie on your side with your knees slightly bent. If you lie on your back, put a pillow under your  knees.  Only take over-the-counter or prescription medicines as directed by your caregiver. Over-the-counter medicines to reduce pain and inflammation are often the most helpful.Your caregiver may prescribe muscle relaxant drugs.These medicines help dull your pain so you can more quickly return to your normal activities and healthy exercise.  Put ice on the injured area.  Put ice in a plastic bag.  Place a towel between your skin and the bag.  Leave the ice on for 15-20 minutes, 03-04 times a day for the first 2 to 3 days. After that, ice and heat may be alternated to reduce pain and spasms.  Ask your caregiver about trying back exercises and gentle massage. This may be of some benefit.  Avoid feeling anxious or stressed.Stress increases muscle tension and can worsen back pain.It is important to recognize when you are anxious or stressed and learn ways to manage it.Exercise is a great option. SEEK MEDICAL CARE IF:  You have pain that is not relieved with rest or medicine.  You have pain that does not improve in 1 week.  You have new symptoms.  You are generally not feeling well. SEEK IMMEDIATE MEDICAL CARE IF:   You have pain that radiates from your back into your legs.  You develop new bowel or bladder control problems.  You have unusual weakness or numbness in your arms or legs.  You develop nausea or vomiting.  You develop abdominal pain.  You feel faint. Document Released: 02/25/2005 Document Revised: 08/27/2011 Document Reviewed: 06/29/2013 [facility] Patient Information 2015  [place], [place]. This information is not intended to replace advice given to you by your health care provider. Make sure you discuss any questions you have with your health care provider. Back Pain, Adult Low back pain is very common. About 1 in 5 people have back pain.The cause of low back pain is rarely dangerous. The pain often gets better over time.About half of people with a sudden onset of back pain feel better in just 2 weeks. About 8 in 10 people feel better by 6 weeks.  CAUSES Some common causes of back pain include:  Strain of the muscles or ligaments supporting the spine.  Wear and tear (degeneration) of the spinal discs.  Arthritis.  Direct injury to the back. DIAGNOSIS Most of the time, the direct cause of low back pain is not known.However, back pain can be treated effectively even when the exact cause of the pain is unknown.Answering your caregiver's questions about your overall health and symptoms is one of the most accurate ways to make sure the cause of your pain is not dangerous. If your caregiver needs more information, he or she may order lab work or imaging tests (X-rays or MRIs).However, even if imaging tests show changes in your back, this usually does not require surgery. HOME CARE INSTRUCTIONS For many people, back pain returns.Since low back pain is rarely dangerous, it is often a condition that people can learn to [facility] their own.   Remain active. It is stressful on the back to sit or stand in one place. Do not sit, drive, or stand in one place for more than 30 minutes at a time. Take short walks on level surfaces as soon as pain allows.Try to increase the length of time you walk each day.  Do not stay in bed.Resting more than 1 or 2 days can delay your recovery.  Do not avoid exercise or work.Your body is made to move.It is not dangerous to be active, even though your  back may hurt.Your back will likely heal faster if you return to being active before  your pain is gone.  Pay attention to your body when you bend and lift. Many people have less discomfortwhen lifting if they bend their knees, keep the load close to their bodies,and avoid twisting. Often, the most comfortable positions are those that put less stress on your recovering back.  Find a comfortable position to sleep. Use a firm mattress and lie on your side with your knees slightly bent. If you lie on your back, put a pillow under your knees.  Only take over-the-counter or prescription medicines as directed by your caregiver. Over-the-counter medicines to reduce pain and inflammation are often the most helpful.Your caregiver may prescribe muscle relaxant drugs.These medicines help dull your pain so you can more quickly return to your normal activities and healthy exercise.  Put ice on the injured area.  Put ice in a plastic bag.  Place a towel between your skin and the bag.  Leave the ice on for 15-20 minutes, 03-04 times a day for the first 2 to 3 days. After that, ice and heat may be alternated to reduce pain and spasms.  Ask your caregiver about trying back exercises and gentle massage. This may be of some benefit.  Avoid feeling anxious or stressed.Stress increases muscle tension and can worsen back pain.It is important to recognize when you are anxious or stressed and learn ways to manage it.Exercise is a great option. SEEK MEDICAL CARE IF:  You have pain that is not relieved with rest or medicine.  You have pain that does not improve in 1 week.  You have new symptoms.  You are generally not feeling well. SEEK IMMEDIATE MEDICAL CARE IF:   You have pain that radiates from your back into your legs.  You develop new bowel or bladder control problems.  You have unusual weakness or numbness in your arms or legs.  You develop nausea or vomiting.  You develop abdominal pain.  You feel faint. Document Released: 02/25/2005 Document Revised: 08/27/2011 Document  Reviewed: 06/29/2013 [facility] Patient Information 2015 [place]. This information is not intended to replace advice given to you by your health care provider. Make sure you discuss any questions you have with your health care provider. Chronic Back Pain  When back pain lasts longer than 3 months, it is called chronic back pain.People with chronic back pain often go through certain periods that are more intense (flare-ups).  CAUSES Chronic back pain can be caused by wear and tear (degeneration) on different structures in your back. These structures include:  The bones of your spine (vertebrae) and the joints surrounding your spinal cord and nerve roots (facets).  The strong, fibrous tissues that connect your vertebrae (ligaments). Degeneration of these structures may result in pressure on your nerves. This can lead to constant pain. HOME CARE INSTRUCTIONS  Avoid bending, heavy lifting, prolonged sitting, and activities which make the problem worse.  Take brief periods of rest throughout the day to reduce your pain. Lying down or standing usually is better than sitting while you are resting.  Take over-the-counter or prescription medicines only as directed by your caregiver. SEEK IMMEDIATE MEDICAL CARE IF:   You have weakness or numbness in one of your legs or feet.  You have trouble controlling your bladder or bowels.  You have nausea, vomiting, abdominal pain, shortness of breath, or fainting. Document Released: 04/04/2004 Document Revised: 05/20/2011 Document Reviewed: 02/09/2011 [employer] Patient Information 2015 [place]. This information is not intended to replace advice given to you by your health care provider. Make sure you discuss any questions you have with your health care provider. Chronic Back Pain  When back pain lasts longer than 3 months, it is called chronic back pain.People with chronic back pain often go through certain periods that are more intense  (flare-ups).  CAUSES Chronic back pain can be caused by wear and tear (degeneration) on different structures in your back. These structures include:  The bones of your spine (vertebrae) and the joints surrounding your spinal cord and nerve roots (facets).  The strong, fibrous tissues that connect your vertebrae (ligaments). Degeneration of these structures may result in pressure on your nerves. This can lead to constant pain. HOME CARE INSTRUCTIONS  Avoid bending, heavy lifting, prolonged sitting, and activities which make the problem worse.  Take brief periods of rest throughout the day to reduce your pain. Lying down or standing usually is better than sitting while you are resting.  Take over-the-counter or prescription medicines only as directed by your caregiver. SEEK IMMEDIATE MEDICAL CARE IF:   You have weakness or numbness in one of your legs or feet.  You have trouble controlling your bladder or bowels.  You have nausea, vomiting, abdominal pain, shortness of breath, or fainting. Document Released: 04/04/2004 Document Revised: 05/20/2011 Document Reviewed: 02/09/2011 [facility] Patient Information 2015 [place], [place]. This information is not intended to replace advice given to you by your health care provider. Make sure you discuss any questions you have with your health care provider.

## 2014-05-26 NOTE — Progress Notes (Signed)
Subjective:    Patient ID: Deborah Knight, female    DOB: August 12, 1965, 49 y.o.   MRN: [id number]  HPI  [name], a 48 year old female with a history of hypertension, depression, and asthma, presents for an evaluation of chronic low back pain. She states that back pain has been increasing over the past several weeks. She states that she is unable to sleep due to increased back pain and it has been difficult to walk.   Symptoms have been present for several years and include pain in lumbar sacral area (aching and throbbing in character; 6/10 in severity). Patient has not idenitfied and initial inciting event. Symptoms often worsen throughout the day.  Exacerbating factors identifiable by patient are recumbency, sitting, standing and walking uphill. Treatments so far initiated by patient include: heating pad and Ibuprofen. Previous workup Includes CT of cervical spine and xrays.   Past Medical History  Diagnosis Date  . Hypertension   . Anxiety   . Seasonal allergies   . HLD (hyperlipidemia)   . Asthma    History   Social History  . Marital Status: Single    Spouse Name: N/A  . Number of Children: N/A  . Years of Education: N/A   Occupational History  . Not on file.   Social History Main Topics  . Smoking status: Current Some Day Smoker    Types: Cigars  . Smokeless tobacco: Not on file  . Alcohol Use: No  . Drug Use: No  . Sexual Activity: Yes    Birth Control/ Protection: Condom, None   Other Topics Concern  . Not on file   Social History Narrative   Employed as a Education officer, environmental.   Allergies  Allergen Reactions  . Losartan Swelling    Lip swelling  . Shellfish-Derived Products     Other reaction(s): Angioedema (ALLERGY/intolerance)  . Toradol [Ketorolac Tromethamine] Swelling    Lip swelling  . Latex Rash   Review of Systems  Constitutional: Negative.   HENT: Negative.   Eyes: Negative.   Respiratory: Negative for cough, shortness of breath  and stridor.   Gastrointestinal: Negative.   Genitourinary: Negative.  Negative for urgency and pelvic pain.  Musculoskeletal: Positive for back pain.  Skin: Negative.   Allergic/Immunologic: Negative.   Neurological: Negative.   Hematological: Negative.   Psychiatric/Behavioral: Negative.        Patient has a history of depression and is under the care of Dr. Patton Salles, therapist.        Objective:   Physical Exam  Constitutional: She is oriented to person, place, and time. She appears well-developed and well-nourished.  HENT:  Head: Normocephalic and atraumatic.  Right Ear: External ear normal.  Left Ear: External ear normal.  Mouth/Throat: Oropharynx is clear and moist.  Eyes: Conjunctivae and EOM are normal. Pupils are equal, round, and reactive to light.  Neck: Normal range of motion. Neck supple.  Cardiovascular: Normal rate, regular rhythm, normal heart sounds and intact distal pulses.   Pulmonary/Chest: Effort normal and breath sounds normal.  Abdominal: Soft. Bowel sounds are normal.  Musculoskeletal:       Lumbar back: She exhibits decreased range of motion, tenderness, pain and spasm. She exhibits no swelling and no edema.  Neurological: She is alert and oriented to person, place, and time. She has normal reflexes.  Skin: Skin is warm, dry and intact.  Psychiatric: Her behavior is normal. Judgment and thought content normal. Her affect is labile. She exhibits  a depressed mood (tearful). She expresses no homicidal and no suicidal ideation.  Vitals reviewed.    BP 148/87 mmHg  Pulse 91  Temp(Src) 98.6 F (37 C) (Oral)  Resp 16  Ht 5\' 2"  (1.575 m)  Wt 224 lb (101.606 kg)  BMI 40.96 kg/m2  LMP 08/13/2012 Assessment & Plan:   1. Chronic back pain greater than 3 months duration [name] states that she completed a course of physical therapy several years ago for chronic back pain. She reports that pain primarily occurs in the lumbar sacral area. No fever,  neurological deficits, infection risk factors, or unexplained weight loss. She reports that she has severe nocturnal pain and it is often difficult to walk long distances. Her pain is no longer relieved by ibuprofen and muscle relaxants. Reviewed previous CT of spine from 02/07/2014, which does not show nerve root edema, effacement, no disc extrusion or stenosis, fracture, or spondylolisthesis.   - Ambulatory referral to Orthopedic Surgery - acetaminophen (TYLENOL) 500 MG tablet; Take 1 tablet (500 mg total) by mouth every 6 (six) hours as needed for moderate pain.  Dispense: 30 tablet; Refill: 0   2. Back muscle spasm Back spasms reproduced with straight left lifting. Patient to continue muscle relaxants as previously prescribed.   3. Depression [name] is tearful today. She states that she has been grieving her son a great deal due to the fact that it is his birthday month. She states that she has increased the number of visits to her therapist. Also, therapist increased Zoloft to 100 mg daily. Patient denies suicidal or homicidal ideations.   4. Tobacco abuse Patient is in the pre-contemplative state. Smoking cessation instruction/counseling given:  counseled patient on the dangers of tobacco use, advised patient to stop smoking, and reviewed strategies to maximize success   Massie MaroonHollis,Aarilyn Dye M, FNP

## 2014-05-27 ENCOUNTER — Telehealth: Payer: Self-pay | Admitting: Internal Medicine

## 2014-05-27 NOTE — Telephone Encounter (Signed)
Received faxed request for medical records. [facility] to [facility].

## 2014-05-28 ENCOUNTER — Encounter: Payer: Self-pay | Admitting: Family Medicine

## 2014-05-28 MED ORDER — ACETAMINOPHEN 500 MG PO TABS: 500.0000 mg | ORAL_TABLET | Freq: Four times a day (QID) | ORAL | Status: DC | PRN

## 2014-07-21 ENCOUNTER — Emergency Department (HOSPITAL_COMMUNITY): Payer: Worker's Compensation

## 2014-07-21 ENCOUNTER — Emergency Department (HOSPITAL_COMMUNITY): Payer: Self-pay

## 2014-07-21 ENCOUNTER — Ambulatory Visit (INDEPENDENT_AMBULATORY_CARE_PROVIDER_SITE_OTHER): Payer: Self-pay

## 2014-07-21 ENCOUNTER — Emergency Department (INDEPENDENT_AMBULATORY_CARE_PROVIDER_SITE_OTHER)
Admission: EM | Admit: 2014-07-21 | Discharge: 2014-07-21 | Discharge status: Against Medical Advice | Payer: Self-pay | Source: Home / Self Care | Attending: Family Medicine | Admitting: Family Medicine

## 2014-07-21 ENCOUNTER — Ambulatory Visit (INDEPENDENT_AMBULATORY_CARE_PROVIDER_SITE_OTHER)
Admission: RE | Admit: 2014-07-21 | Discharge: 2014-07-21 | Disposition: A | Discharge status: Home / Self Care | Payer: Self-pay | Source: Ambulatory Visit | Attending: Family Medicine | Admitting: Family Medicine

## 2014-07-21 ENCOUNTER — Ambulatory Visit (HOSPITAL_COMMUNITY): Admission: RE | Admit: 2014-07-21 | Payer: Worker's Compensation | Source: Ambulatory Visit

## 2014-07-21 ENCOUNTER — Encounter (HOSPITAL_COMMUNITY): Payer: Self-pay | Admitting: Emergency Medicine

## 2014-07-21 DIAGNOSIS — M79673 Pain in unspecified foot: Secondary | ICD-10-CM

## 2014-07-21 MED ORDER — DICLOFENAC SODIUM 75 MG PO TBEC: 75.0000 mg | DELAYED_RELEASE_TABLET | Freq: Two times a day (BID) | ORAL | Status: DC | PRN

## 2014-07-21 NOTE — ED Notes (Signed)
C/o  Having an episode of the room spinning while sitting yesterday.    Left foot swelling off/on for several years but over the past few days pain has become significatly worse.     Also having n/v/d  On set this a.m.   Pt is having no relief with otc meds.

## 2014-07-21 NOTE — Discharge Instructions (Signed)
Please call or see Ms Deborah Knightfor assistance with your bill.  °You may qualify for reduced or free services.  °Her phone number is [phone]. °Her email is [email or web address] ° °

## 2014-07-21 NOTE — ED Provider Notes (Signed)
Deborah Knight is a 49 y.o. female who presents to Urgent Care today for foot pain. Patient notes left foot pain and swelling occurring off and on over the last few months and years. Pain worsened recently without injury. No radiating pain weakness or numbness. No fevers or chills. Additionally she developed dizziness yesterday. She describes a room spinning sensation that lasted for about 20 minutes or so. She developed some vomiting. She feels better today. She denies any weakness or numbness or loss of function.   She has tried some ibuprofen and Tylenol which did not help much.  Past Medical History  Diagnosis Date  . Hypertension   . Anxiety   . Seasonal allergies   . HLD (hyperlipidemia)   . Asthma    Past Surgical History  Procedure Laterality Date  . Cesarean section  1997  . Hernia repair  1989  . Tubal ligation  1997  . Shoulder arthroscopy     History  Substance Use Topics  . Smoking status: Current Some Day Smoker    Types: Cigars  . Smokeless tobacco: Not on file  . Alcohol Use: No   ROS as above Medications: No current facility-administered medications for this encounter.   Current Outpatient Prescriptions  Medication Sig Dispense Refill  . amitriptyline (ELAVIL) 25 MG tablet Take 1 tablet (25 mg total) by mouth at bedtime. 30 tablet 0  . amLODipine (NORVASC) 10 MG tablet Take 1 tablet (10 mg total) by mouth daily. 90 tablet 1  . cetirizine (ZYRTEC) 10 MG tablet Take 1 tablet (10 mg total) by mouth daily. 90 tablet 3  . hydrochlorothiazide (HYDRODIURIL) 25 MG tablet Take 1 tablet (25 mg total) by mouth daily. 90 tablet 1  . metoprolol tartrate (LOPRESSOR) 25 MG tablet TAKE 1 TABLET BY MOUTH 2 TIMES DAILY. 60 tablet 1  . sertraline (ZOLOFT) 50 MG tablet TAKE 1.5 TABLET (75 MG TOTAL) BY MOUTH DAILY. (Patient taking differently: Take 100 mg by mouth daily. TAKE 1.5 TABLET (75 MG TOTAL) BY MOUTH DAILY.) 90 tablet 1  . acetaminophen (TYLENOL) 500 MG tablet Take 1  tablet (500 mg total) by mouth every 6 (six) hours as needed for moderate pain. 30 tablet 0  . albuterol (PROVENTIL HFA;VENTOLIN HFA) 108 (90 BASE) MCG/ACT inhaler Inhale 2 puffs into the lungs every 6 (six) hours as needed for wheezing. Shortness of breath 1 Inhaler 2  . diclofenac (VOLTAREN) 75 MG EC tablet Take 1 tablet (75 mg total) by mouth 2 (two) times daily as needed. 30 tablet 0  . diphenhydrAMINE (BENADRYL) 12.5 MG/5ML elixir Take 5 mLs (12.5 mg total) by mouth 4 (four) times daily as needed for itching. 120 mL 0  . EPINEPHrine (EPIPEN) 0.3 mg/0.3 mL IJ SOAJ injection Inject 0.3 mg into the muscle once.    . ergocalciferol (VITAMIN D2) 50000 UNITS capsule Take 1 capsule (50,000 Units total) by mouth once a week. (Patient taking differently: Take 50,000 Units by mouth once a week. sundays) 4 capsule 12  . fluticasone (FLONASE) 50 MCG/ACT nasal spray Place 2 sprays into both nostrils daily as needed (congestion). 16 g 3  . fluticasone-salmeterol (ADVAIR HFA) 115-21 MCG/ACT inhaler Inhale 2 puffs into the lungs 2 (two) times daily. 1 Inhaler 11  . methocarbamol (ROBAXIN) 500 MG tablet Take 1 tablet (500 mg total) by mouth 2 (two) times daily. 20 tablet 0  . montelukast (SINGULAIR) 10 MG tablet Take 1 tablet (10 mg total) by mouth at bedtime. 90 tablet 3  .  omeprazole (PRILOSEC) 20 MG capsule Take 1 capsule (20 mg total) by mouth daily. 90 capsule 3  . potassium chloride SA (K-DUR,KLOR-CON) 20 MEQ tablet Take 1 tablet (20 mEq total) by mouth daily. 30 tablet 3  . pravastatin (PRAVACHOL) 40 MG tablet Take 1 tablet (40 mg total) by mouth daily. 90 tablet 3   Allergies  Allergen Reactions  . Losartan Swelling    Lip swelling  . Shellfish-Derived Products     Other reaction(s): Angioedema (ALLERGY/intolerance)  . Toradol [Ketorolac Tromethamine] Swelling    Lip swelling  . Latex Rash     Exam:  BP 139/92 mmHg  Pulse 86  Temp(Src) 97.7 F (36.5 C) (Oral)  Resp 20  SpO2 98%  LMP  08/13/2012 Gen: Well NAD HEENT: EOMI,  MMM Lungs: Normal work of breathing. CTABL Heart: RRR no MRG Abd: NABS, Soft. Nondistended, Nontender Exts: Brisk capillary refill, warm and well perfused.   No results found for this or any previous visit (from the past 24 hour(s)). Dg Ankle Complete Left  07/21/2014   CLINICAL DATA:  Foot swelling.  EXAM: LEFT ANKLE COMPLETE - 3+ VIEW  COMPARISON:  None.  FINDINGS: There is no fracture or dislocation or joint effusion. There is soft tissue swelling lower leg and around the ankle and on the dorsum of the foot. There is a small spur at the tip of the lateral malleolus.  IMPRESSION: No acute abnormality.   Electronically Signed   By: Francene BoyersJames  Maxwell M.D.   On: 07/21/2014 14:48   Dg Foot Complete Left  07/21/2014   CLINICAL DATA:  Swelling of the foot  EXAM: LEFT FOOT - COMPLETE 3+ VIEW  COMPARISON:  None.  FINDINGS: Tarsal-metatarsal alignment is normal. Joint spaces appear normal. No erosion is seen. Only a small plantar calcaneal degenerative spur is present.  IMPRESSION: No significant abnormality. Small plantar calcaneal degenerative spur.   Electronically Signed   By: Dwyane DeePaul  Barry M.D.   On: 07/21/2014 14:00    Assessment and Plan: 49 y.o. female with after x-rays were obtained but before they were read the patient had to leave. She left AMA. I called her with the results of the x-ray and will treat with diclofenac and recommend follow-up with [facility]. She will contact our office financial counselor to get [facility]   Discussed warning signs or symptoms. Please see discharge instructions. Patient expresses understanding.     Rodolph BongEvan S Shereena Berquist, MD 07/21/14 [phone]

## 2014-07-25 ENCOUNTER — Ambulatory Visit: Payer: Self-pay | Admitting: Internal Medicine

## 2014-08-31 ENCOUNTER — Ambulatory Visit: Payer: Self-pay | Attending: Internal Medicine

## 2014-10-18 ENCOUNTER — Emergency Department (HOSPITAL_COMMUNITY)
Admission: EM | Admit: 2014-10-18 | Discharge: 2014-10-18 | Disposition: A | Discharge status: Home / Self Care | Payer: Self-pay | Attending: Emergency Medicine | Admitting: Emergency Medicine

## 2014-10-18 ENCOUNTER — Encounter (HOSPITAL_COMMUNITY): Payer: Self-pay | Admitting: Emergency Medicine

## 2014-10-18 ENCOUNTER — Emergency Department (HOSPITAL_COMMUNITY): Payer: Self-pay

## 2014-10-18 DIAGNOSIS — E785 Hyperlipidemia, unspecified: Secondary | ICD-10-CM | POA: Insufficient documentation

## 2014-10-18 DIAGNOSIS — Z79899 Other long term (current) drug therapy: Secondary | ICD-10-CM | POA: Insufficient documentation

## 2014-10-18 DIAGNOSIS — R197 Diarrhea, unspecified: Secondary | ICD-10-CM | POA: Insufficient documentation

## 2014-10-18 DIAGNOSIS — R109 Unspecified abdominal pain: Secondary | ICD-10-CM

## 2014-10-18 DIAGNOSIS — Z7951 Long term (current) use of inhaled steroids: Secondary | ICD-10-CM | POA: Insufficient documentation

## 2014-10-18 DIAGNOSIS — Z91040 Latex allergy status: Secondary | ICD-10-CM | POA: Insufficient documentation

## 2014-10-18 DIAGNOSIS — N200 Calculus of kidney: Secondary | ICD-10-CM | POA: Insufficient documentation

## 2014-10-18 DIAGNOSIS — I10 Essential (primary) hypertension: Secondary | ICD-10-CM | POA: Insufficient documentation

## 2014-10-18 DIAGNOSIS — F419 Anxiety disorder, unspecified: Secondary | ICD-10-CM | POA: Insufficient documentation

## 2014-10-18 DIAGNOSIS — Z9851 Tubal ligation status: Secondary | ICD-10-CM | POA: Insufficient documentation

## 2014-10-18 DIAGNOSIS — J45909 Unspecified asthma, uncomplicated: Secondary | ICD-10-CM | POA: Insufficient documentation

## 2014-10-18 DIAGNOSIS — Z720 Tobacco use: Secondary | ICD-10-CM | POA: Insufficient documentation

## 2014-10-18 LAB — URINE MICROSCOPIC-ADD ON

## 2014-10-18 LAB — URINALYSIS, ROUTINE W REFLEX MICROSCOPIC
Bilirubin Urine: NEGATIVE
GLUCOSE, UA: NEGATIVE mg/dL
Ketones, ur: NEGATIVE mg/dL
Nitrite: NEGATIVE
Protein, ur: NEGATIVE mg/dL
Specific Gravity, Urine: 1.016 (ref 1.005–1.030)
UROBILINOGEN UA: 0.2 mg/dL (ref 0.0–1.0)
pH: 8 (ref 5.0–8.0)

## 2014-10-18 LAB — I-STAT CHEM 8, ED
BUN: 10 mg/dL (ref 6–20)
CALCIUM ION: 1.23 mmol/L (ref 1.12–1.23)
CREATININE: 0.6 mg/dL (ref 0.44–1.00)
Chloride: 105 mmol/L (ref 101–111)
Glucose, Bld: 111 mg/dL — ABNORMAL HIGH (ref 65–99)
HCT: 42 % (ref 36.0–46.0)
Hemoglobin: 14.3 g/dL (ref 12.0–15.0)
POTASSIUM: 3.7 mmol/L (ref 3.5–5.1)
SODIUM: 145 mmol/L (ref 135–145)
TCO2: 25 mmol/L (ref 0–100)

## 2014-10-18 MED ORDER — ONDANSETRON HCL 4 MG PO TABS: 4.0000 mg | ORAL_TABLET | Freq: Three times a day (TID) | ORAL | Status: DC | PRN

## 2014-10-18 MED ORDER — OXYCODONE-ACETAMINOPHEN 5-325 MG PO TABS: 0.5000 | ORAL_TABLET | ORAL | Status: DC | PRN

## 2014-10-18 MED ORDER — SODIUM CHLORIDE 0.9 % IV SOLN
1000.0000 mL | Freq: Once | INTRAVENOUS | Status: AC
  Administered 2014-10-18: 1000 mL via INTRAVENOUS

## 2014-10-18 MED ORDER — ONDANSETRON 4 MG PO TBDP
4.0000 mg | ORAL_TABLET | Freq: Once | ORAL | Status: AC
  Administered 2014-10-18: 4 mg via ORAL
  Filled 2014-10-18: qty 1

## 2014-10-18 MED ORDER — OXYCODONE HCL 5 MG PO TABS
10.0000 mg | ORAL_TABLET | Freq: Once | ORAL | Status: AC
Start: 2014-10-18 — End: 2014-10-18
  Administered 2014-10-18: 10 mg via ORAL
  Filled 2014-10-18 (×2): qty 2

## 2014-10-18 MED ORDER — SODIUM CHLORIDE 0.9 % IV SOLN
1000.0000 mL | INTRAVENOUS | Status: DC
  Administered 2014-10-18: 1000 mL via INTRAVENOUS

## 2014-10-18 MED ORDER — ONDANSETRON HCL 4 MG/2ML IJ SOLN
4.0000 mg | Freq: Once | INTRAMUSCULAR | Status: AC
  Administered 2014-10-18: 4 mg via INTRAVENOUS
  Filled 2014-10-18: qty 2

## 2014-10-18 MED ORDER — TAMSULOSIN HCL 0.4 MG PO CAPS: 0.4000 mg | ORAL_CAPSULE | Freq: Every day | ORAL | Status: DC

## 2014-10-18 MED ORDER — HYDROMORPHONE HCL 1 MG/ML IJ SOLN
1.0000 mg | Freq: Once | INTRAMUSCULAR | Status: AC
  Administered 2014-10-18: 1 mg via INTRAVENOUS
  Filled 2014-10-18: qty 1

## 2014-10-18 NOTE — ED Provider Notes (Signed)
CSN: [id number]     Arrival date & time 10/18/14  1540 History   First MD Initiated Contact with Patient 10/18/14 1546     Chief Complaint  Patient presents with  . Flank Pain     (Consider location/radiation/quality/duration/timing/severity/associated sxs/prior Treatment) HPI  49 yo F w/ h/o HTN and nephrolithiasis presents to the emergency department today with right-sided flank pain and decreased urine output for approximately 12 hours. Pain is similar to stone in the past however has not had decreased urine before. No preceding fever, dysuria, polyuria or other symptoms. Has had one episode of diarrhea that was nonbloody but does not feel is related. Has had some nausea without vomiting. Receive some pain medication with EMS and initially worked however the pain is now worsening again. No recent travels, suspicious food intake, sick contacts.  Past Medical History  Diagnosis Date  . Hypertension   . Anxiety   . Seasonal allergies   . HLD (hyperlipidemia)   . Asthma    Past Surgical History  Procedure Laterality Date  . Cesarean section  1997  . Hernia repair  1989  . Tubal ligation  1997  . Shoulder arthroscopy     Family History  Problem Relation Age of Onset  . Asthma Father   . Asthma Sister   . Asthma Brother   . Asthma Maternal Grandmother   . Asthma Maternal Grandfather   . Asthma Paternal Grandmother   . Asthma Paternal Grandfather   . Cancer Father   . Hyperlipidemia Mother   . Hyperlipidemia Sister    History  Substance Use Topics  . Smoking status: Current Some Day Smoker    Types: Cigars  . Smokeless tobacco: Not on file  . Alcohol Use: No   OB History    No data available     Review of Systems  Constitutional: Negative for fever and chills.  Respiratory: Negative for cough, choking and shortness of breath.   Cardiovascular: Negative for chest pain and palpitations.  Gastrointestinal: Positive for nausea and diarrhea. Negative for abdominal pain  and abdominal distention.  Endocrine: Negative for polydipsia and polyuria.  Genitourinary: Negative for dyspareunia.  All other systems reviewed and are negative.     Allergies  Losartan; Shellfish-derived products; Toradol; and Latex  Home Medications   Prior to Admission medications   Medication Sig Start Date End Date Taking? Authorizing Provider  acetaminophen (TYLENOL) 500 MG tablet Take 1 tablet (500 mg total) by mouth every 6 (six) hours as needed for moderate pain. 05/28/14   [name], FNP  albuterol (PROVENTIL HFA;VENTOLIN HFA) 108 (90 BASE) MCG/ACT inhaler Inhale 2 puffs into the lungs every 6 (six) hours as needed for wheezing. Shortness of breath 04/08/14   [name], FNP  amitriptyline (ELAVIL) 25 MG tablet Take 1 tablet (25 mg total) by mouth at bedtime. 12/06/13   [name], FNP  amLODipine (NORVASC) 10 MG tablet Take 1 tablet (10 mg total) by mouth daily. 05/13/14   [name], FNP  cetirizine (ZYRTEC) 10 MG tablet Take 1 tablet (10 mg total) by mouth daily. 08/23/13   [name], MD  diclofenac (VOLTAREN) 75 MG EC tablet Take 1 tablet (75 mg total) by mouth 2 (two) times daily as needed. 07/21/14   Rodolph Bong, MD  diphenhydrAMINE (BENADRYL) 12.5 MG/5ML elixir Take 5 mLs (12.5 mg total) by mouth 4 (four) times daily as needed for itching. 11/16/13   [name], FNP  EPINEPHrine (  EPIPEN) 0.3 mg/0.3 mL IJ SOAJ injection Inject 0.3 mg into the muscle once.    Historical Provider, MD  ergocalciferol (VITAMIN D2) 50000 UNITS capsule Take 1 capsule (50,000 Units total) by mouth once a week. Patient taking differently: Take 50,000 Units by mouth once a week. sundays 08/24/13   [name], MD  fluticasone (FLONASE) 50 MCG/ACT nasal spray Place 2 sprays into both nostrils daily as needed (congestion). 08/23/13   [name], MD  fluticasone-salmeterol (ADVAIR HFA) [phone] MCG/ACT inhaler Inhale 2 puffs into the lungs 2 (two) times  daily. 08/23/13   [name], MD  hydrochlorothiazide (HYDRODIURIL) 25 MG tablet Take 1 tablet (25 mg total) by mouth daily. 08/23/13   [name], MD  methocarbamol (ROBAXIN) 500 MG tablet Take 1 tablet (500 mg total) by mouth 2 (two) times daily. 02/07/14   [name], PA-C  metoprolol tartrate (LOPRESSOR) 25 MG tablet TAKE 1 TABLET BY MOUTH 2 TIMES DAILY. 05/13/14   [name], FNP  montelukast (SINGULAIR) 10 MG tablet Take 1 tablet (10 mg total) by mouth at bedtime. 08/23/13   [name], MD  omeprazole (PRILOSEC) 20 MG capsule Take 1 capsule (20 mg total) by mouth daily. 08/23/13   [name], MD  potassium chloride SA (K-DUR,KLOR-CON) 20 MEQ tablet Take 1 tablet (20 mEq total) by mouth daily. 08/24/13   [name], MD  pravastatin (PRAVACHOL) 40 MG tablet Take 1 tablet (40 mg total) by mouth daily. 09/23/13   [name], MD  sertraline (ZOLOFT) 50 MG tablet TAKE 1.5 TABLET (75 MG TOTAL) BY MOUTH DAILY. Patient taking differently: Take 100 mg by mouth daily. TAKE 1.5 TABLET (75 MG TOTAL) BY MOUTH DAILY. 09/23/13   [name], MD   BP 148/101 mmHg  Pulse 73  Temp(Src) 98.5 F (36.9 C) (Oral)  Resp 16  SpO2 95%  LMP 08/13/2012 Physical Exam  Constitutional: She is oriented to person, place, and time. She appears well-developed and well-nourished.  HENT:  Head: Normocephalic and atraumatic.  Eyes: Conjunctivae and EOM are normal. Right eye exhibits no discharge. Left eye exhibits no discharge.  Cardiovascular: Normal rate and regular rhythm.   Pulmonary/Chest: Effort normal and breath sounds normal. No respiratory distress.  Abdominal: Soft. She exhibits no distension. There is no tenderness. There is no rebound.  Musculoskeletal: Normal range of motion. She exhibits no edema or tenderness.  Neurological: She is alert and oriented to person, place, and time.  Skin: Skin is warm and dry.  Nursing note and vitals  reviewed.   ED Course  Procedures (including critical care time) Labs Review Labs Reviewed  URINALYSIS, ROUTINE W REFLEX MICROSCOPIC (NOT AT [facility])    Imaging Review No results found.   EKG Interpretation None      MDM   Final diagnoses:  None    49 yo F w/ hydronephrosis, flank pain 2/2 ureterolithiasis. No kidney damage. Tolerating PO, no UTI. Pain improved in ED will continue pain meds at home along with flomax. Has urologist and will go back, if not info for [facility] given.   I have personally and contemperaneously reviewed labs and imaging and used in my decision making as above.   A medical screening exam was performed and I feel the patient has had an appropriate workup for their chief complaint at this time and likelihood of emergent condition existing is low. They have been counseled on decision, discharge, follow up and which symptoms necessitate  immediate return to the emergency department. They or their family verbally stated understanding and agreement with plan and discharged in stable condition.      Marily Memos, MD 10/19/14 1340

## 2014-10-18 NOTE — ED Notes (Signed)
Bed: WA04 Expected date:  Expected time:  Means of arrival:  Comments: EMS- right flank pain, hx of kidney stones

## 2014-10-18 NOTE — ED Notes (Signed)
Per EMS sudden onset right flank pain and dysuria today, went on for 3 hours before calling EMS. Hx of kidney stones, states the pain is more intense than previous kidney stones.  Given 100 mcg of Fentanyl in route.

## 2014-10-31 ENCOUNTER — Ambulatory Visit: Payer: Self-pay | Admitting: Family Medicine

## 2014-12-12 ENCOUNTER — Other Ambulatory Visit: Payer: Self-pay | Admitting: Family Medicine

## 2014-12-12 DIAGNOSIS — I10 Essential (primary) hypertension: Secondary | ICD-10-CM

## 2014-12-12 MED ORDER — AMLODIPINE BESYLATE 10 MG PO TABS: 10.0000 mg | ORAL_TABLET | Freq: Every day | ORAL | Status: DC

## 2014-12-12 MED ORDER — METOPROLOL TARTRATE 25 MG PO TABS: ORAL_TABLET | ORAL | Status: DC

## 2014-12-12 MED ORDER — HYDROCHLOROTHIAZIDE 25 MG PO TABS: 25.0000 mg | ORAL_TABLET | Freq: Every day | ORAL | Status: DC

## 2014-12-12 NOTE — Telephone Encounter (Signed)
meds have been refilled to [facility]. Thanks!

## 2014-12-12 NOTE — Telephone Encounter (Signed)
PLEASE SEND TO [facility]

## 2014-12-19 ENCOUNTER — Ambulatory Visit: Payer: Self-pay | Admitting: Family Medicine

## 2014-12-20 ENCOUNTER — Ambulatory Visit: Payer: Self-pay | Admitting: Family Medicine

## 2014-12-30 ENCOUNTER — Ambulatory Visit: Payer: Self-pay

## 2014-12-30 VITALS — BP 142/90 | HR 71 | Temp 98.2°F | Resp 16 | Ht 62.0 in | Wt 219.0 lb

## 2014-12-30 DIAGNOSIS — I10 Essential (primary) hypertension: Secondary | ICD-10-CM

## 2015-01-31 ENCOUNTER — Ambulatory Visit (INDEPENDENT_AMBULATORY_CARE_PROVIDER_SITE_OTHER): Payer: Self-pay | Admitting: Family Medicine

## 2015-01-31 ENCOUNTER — Encounter: Payer: Self-pay | Admitting: Family Medicine

## 2015-01-31 VITALS — BP 132/83 | HR 90 | Temp 98.3°F | Resp 16 | Ht 62.0 in | Wt 219.0 lb

## 2015-01-31 DIAGNOSIS — Z23 Encounter for immunization: Secondary | ICD-10-CM

## 2015-01-31 DIAGNOSIS — K219 Gastro-esophageal reflux disease without esophagitis: Secondary | ICD-10-CM

## 2015-01-31 DIAGNOSIS — J309 Allergic rhinitis, unspecified: Secondary | ICD-10-CM

## 2015-01-31 DIAGNOSIS — E785 Hyperlipidemia, unspecified: Secondary | ICD-10-CM

## 2015-01-31 DIAGNOSIS — F32A Depression, unspecified: Secondary | ICD-10-CM

## 2015-01-31 DIAGNOSIS — I10 Essential (primary) hypertension: Secondary | ICD-10-CM

## 2015-01-31 DIAGNOSIS — G4700 Insomnia, unspecified: Secondary | ICD-10-CM

## 2015-01-31 DIAGNOSIS — Z720 Tobacco use: Secondary | ICD-10-CM

## 2015-01-31 DIAGNOSIS — F329 Major depressive disorder, single episode, unspecified: Secondary | ICD-10-CM

## 2015-01-31 LAB — POCT URINALYSIS DIP (DEVICE)
Bilirubin Urine: NEGATIVE
GLUCOSE, UA: NEGATIVE mg/dL
KETONES UR: NEGATIVE mg/dL
Nitrite: NEGATIVE
PROTEIN: NEGATIVE mg/dL
SPECIFIC GRAVITY, URINE: 1.02 (ref 1.005–1.030)
UROBILINOGEN UA: 1 mg/dL (ref 0.0–1.0)
pH: 7 (ref 5.0–8.0)

## 2015-01-31 LAB — COMPLETE METABOLIC PANEL WITH GFR
ALK PHOS: 93 U/L (ref 33–130)
ALT: 19 U/L (ref 6–29)
AST: 16 U/L (ref 10–35)
Albumin: 3.9 g/dL (ref 3.6–5.1)
BILIRUBIN TOTAL: 0.4 mg/dL (ref 0.2–1.2)
BUN: 10 mg/dL (ref 7–25)
CO2: 28 mmol/L (ref 20–31)
Calcium: 9.6 mg/dL (ref 8.6–10.4)
Chloride: 101 mmol/L (ref 98–110)
Creat: 0.47 mg/dL — ABNORMAL LOW (ref 0.50–1.05)
GFR, Est African American: >89 mL/min (ref >=60)
Glucose, Bld: 82 mg/dL (ref 65–99)
Potassium: 3.3 mmol/L — ABNORMAL LOW (ref 3.5–5.3)
SODIUM: 139 mmol/L (ref 135–146)
TOTAL PROTEIN: 7.2 g/dL (ref 6.1–8.1)

## 2015-01-31 LAB — LIPID PANEL
Cholesterol: 237 mg/dL — ABNORMAL HIGH (ref 125–200)
HDL: 47 mg/dL (ref >=46)
LDL Cholesterol: 170 mg/dL — ABNORMAL HIGH (ref <130)
Total CHOL/HDL Ratio: 5 Ratio (ref <=5.0)
Triglycerides: 98 mg/dL (ref <150)
VLDL: 20 mg/dL (ref <30)

## 2015-01-31 MED ORDER — AMITRIPTYLINE HCL 25 MG PO TABS: 25.0000 mg | ORAL_TABLET | Freq: Every evening | ORAL | Status: DC | PRN

## 2015-01-31 MED ORDER — OMEPRAZOLE 20 MG PO CPDR: 20.0000 mg | DELAYED_RELEASE_CAPSULE | Freq: Every day | ORAL | Status: DC

## 2015-01-31 MED ORDER — AMLODIPINE BESYLATE 10 MG PO TABS: 10.0000 mg | ORAL_TABLET | Freq: Every day | ORAL | Status: DC

## 2015-01-31 MED ORDER — MONTELUKAST SODIUM 10 MG PO TABS: 10.0000 mg | ORAL_TABLET | Freq: Every day | ORAL | Status: DC

## 2015-01-31 MED ORDER — HYDROCHLOROTHIAZIDE 25 MG PO TABS: 25.0000 mg | ORAL_TABLET | Freq: Every day | ORAL | Status: DC

## 2015-01-31 MED ORDER — PRAVASTATIN SODIUM 40 MG PO TABS: 40.0000 mg | ORAL_TABLET | Freq: Every day | ORAL | Status: DC

## 2015-01-31 NOTE — Patient Instructions (Addendum)
Patient has lost 12 pounds since last visit. KEEP UP THE GOOD WORK!  DASH Eating Plan DASH stands for "Dietary Approaches to Stop Hypertension." The DASH eating plan is a healthy eating plan that has been shown to reduce high blood pressure (hypertension). Additional health benefits may include reducing the risk of type 2 diabetes mellitus, heart disease, and stroke. The DASH eating plan may also help with weight loss. WHAT DO I NEED TO KNOW ABOUT THE DASH EATING PLAN? For the DASH eating plan, you will follow these general guidelines:  Choose foods with a percent daily value for sodium of less than 5% (as listed on the food label).  Use salt-free seasonings or herbs instead of table salt or sea salt.  Check with your health care provider or pharmacist before using salt substitutes.  Eat lower-sodium products, often labeled as "lower sodium" or "no salt added."  Eat fresh foods.  Eat more vegetables, fruits, and low-fat dairy products.  Choose whole grains. Look for the word "whole" as the first word in the ingredient list.  Choose fish and skinless chicken or [place] more often than red meat. Limit fish, poultry, and meat to 6 oz (170 g) each day.  Limit sweets, desserts, sugars, and sugary drinks.  Choose heart-healthy fats.  Limit cheese to 1 oz (28 g) per day.  Eat more home-cooked food and less restaurant, buffet, and fast food.  Limit fried foods.  Cook foods using methods other than frying.  Limit canned vegetables. If you do use them, rinse them well to decrease the sodium.  When eating at a restaurant, ask that your food be prepared with less salt, or no salt if possible. WHAT FOODS CAN I EAT? Seek help from a dietitian for individual calorie needs. Grains Whole grain or whole wheat bread. Brown rice. Whole grain or whole wheat pasta. Quinoa, bulgur, and whole grain cereals. Low-sodium cereals. Corn or whole wheat flour tortillas. Whole grain cornbread. Whole grain  crackers. Low-sodium crackers. Vegetables Fresh or frozen vegetables (raw, steamed, roasted, or grilled). Low-sodium or reduced-sodium tomato and vegetable juices. Low-sodium or reduced-sodium tomato sauce and paste. Low-sodium or reduced-sodium canned vegetables.  Fruits All fresh, canned (in natural juice), or frozen fruits. Meat and Other Protein Products Ground beef (85% or leaner), grass-fed beef, or beef trimmed of fat. Skinless chicken or [place]. Ground chicken or [place]. Pork trimmed of fat. All fish and seafood. Eggs. Dried beans, peas, or lentils. Unsalted nuts and seeds. Unsalted canned beans. Dairy Low-fat dairy products, such as skim or 1% milk, 2% or reduced-fat cheeses, low-fat ricotta or cottage cheese, or plain low-fat yogurt. Low-sodium or reduced-sodium cheeses. Fats and Oils Tub margarines without trans fats. Light or reduced-fat mayonnaise and salad dressings (reduced sodium). Avocado. Safflower, olive, or canola oils. Natural peanut or almond butter. Other Unsalted popcorn and pretzels. The items listed above may not be a complete list of recommended foods or beverages. Contact your dietitian for more options. WHAT FOODS ARE NOT RECOMMENDED? Grains White bread. White pasta. White rice. Refined cornbread. Bagels and croissants. Crackers that contain trans fat. Vegetables Creamed or fried vegetables. Vegetables in a cheese sauce. Regular canned vegetables. Regular canned tomato sauce and paste. Regular tomato and vegetable juices. Fruits Dried fruits. Canned fruit in light or heavy syrup. Fruit juice. Meat and Other Protein Products Fatty cuts of meat. Ribs, chicken wings, bacon, sausage, bologna, salami, chitterlings, fatback, hot dogs, bratwurst, and packaged luncheon meats. Salted nuts and seeds. Canned beans with salt. Dairy  Whole or 2% milk, cream, half-and-half, and cream cheese. Whole-fat or sweetened yogurt. Full-fat cheeses or blue cheese. Nondairy creamers and  whipped toppings. Processed cheese, cheese spreads, or cheese curds. Condiments Onion and garlic salt, seasoned salt, table salt, and sea salt. Canned and packaged gravies. Worcestershire sauce. Tartar sauce. Barbecue sauce. Teriyaki sauce. Soy sauce, including reduced sodium. Steak sauce. Fish sauce. Oyster sauce. Cocktail sauce. Horseradish. Ketchup and mustard. Meat flavorings and tenderizers. Bouillon cubes. Hot sauce. Tabasco sauce. Marinades. Taco seasonings. Relishes. Fats and Oils Butter, stick margarine, lard, shortening, ghee, and bacon fat. Coconut, palm kernel, or palm oils. Regular salad dressings. Other Pickles and olives. Salted popcorn and pretzels. The items listed above may not be a complete list of foods and beverages to avoid. Contact your dietitian for more information. WHERE CAN I FIND MORE INFORMATION? National Heart, Lung, and Blood Institute: [employer].itwww.nhlbi.nih.gov/health/health-topics/topics/dash/   This information is not intended to replace advice given to you by your health care provider. Make sure you discuss any questions you have with your health care provider.   Document Released: 02/14/2011 Document Revised: 03/18/2014 Document Reviewed: 12/30/2012 Elsevier Interactive Patient Education 2016 [employer] Hypertension Hypertension, commonly called high blood pressure, is when the force of blood pumping through your arteries is too strong. Your arteries are the blood vessels that carry blood from your heart throughout your body. A blood pressure reading consists of a higher number over a lower number, such as 110/72. The higher number (systolic) is the pressure inside your arteries when your heart pumps. The lower number (diastolic) is the pressure inside your arteries when your heart relaxes. Ideally you want your blood pressure below 120/80. Hypertension forces your heart to work harder to pump blood. Your arteries may become narrow or stiff. Having untreated or  uncontrolled hypertension can cause heart attack, stroke, kidney disease, and other problems. RISK FACTORS Some risk factors for high blood pressure are controllable. Others are not.  Risk factors you cannot control include:   Race. You may be at higher risk if you are African American.  Age. Risk increases with age.  Gender. Men are at higher risk than women before age 11045 years. After age 50, women are at higher risk than men. Risk factors you can control include:  Not getting enough exercise or physical activity.  Being overweight.  Getting too much fat, sugar, calories, or salt in your diet.  Drinking too much alcohol. SIGNS AND SYMPTOMS Hypertension does not usually cause signs or symptoms. Extremely high blood pressure (hypertensive crisis) may cause headache, anxiety, shortness of breath, and nosebleed. DIAGNOSIS To check if you have hypertension, your health care provider will measure your blood pressure while you are seated, with your arm held at the level of your heart. It should be measured at least twice using the same arm. Certain conditions can cause a difference in blood pressure between your right and left arms. A blood pressure reading that is higher than normal on one occasion does not mean that you need treatment. If it is not clear whether you have high blood pressure, you may be asked to return on a different day to have your blood pressure checked again. Or, you may be asked to monitor your blood pressure at home for 1 or more weeks. TREATMENT Treating high blood pressure includes making lifestyle changes and possibly taking medicine. Living a healthy lifestyle can help lower high blood pressure. You may need to change some of your habits. Lifestyle changes may include:  Following the DASH diet. This diet is high in fruits, vegetables, and whole grains. It is low in salt, red meat, and added sugars.  Keep your sodium intake below 2,300 mg per day.  Getting at least  30-45 minutes of aerobic exercise at least 4 times per week.  Losing weight if necessary.  Not smoking.  Limiting alcoholic beverages.  Learning ways to reduce stress. Your health care provider may prescribe medicine if lifestyle changes are not enough to get your blood pressure under control, and if one of the following is true:  You are 3-55 years of age and your systolic blood pressure is above 140.  You are 55 years of age or older, and your systolic blood pressure is above 150.  Your diastolic blood pressure is above 90.  You have diabetes, and your systolic blood pressure is over [id number] or your diastolic blood pressure is over 90.  You have kidney disease and your blood pressure is above 140/90.  You have heart disease and your blood pressure is above 140/90. Your personal target blood pressure may vary depending on your medical conditions, your age, and other factors. HOME CARE INSTRUCTIONS  Have your blood pressure rechecked as directed by your health care provider.   Take medicines only as directed by your health care provider. Follow the directions carefully. Blood pressure medicines must be taken as prescribed. The medicine does not work as well when you skip doses. Skipping doses also puts you at risk for problems.  Do not smoke.   Monitor your blood pressure at home as directed by your health care provider. SEEK MEDICAL CARE IF:   You think you are having a reaction to medicines taken.  You have recurrent headaches or feel dizzy.  You have swelling in your ankles.  You have trouble with your vision. SEEK IMMEDIATE MEDICAL CARE IF:  You develop a severe headache or confusion.  You have unusual weakness, numbness, or feel faint.  You have severe chest or abdominal pain.  You vomit repeatedly.  You have trouble breathing. MAKE SURE YOU:   Understand these instructions.  Will watch your condition.  Will get help right away if you are not doing well  or get worse.   This information is not intended to replace advice given to you by your health care provider. Make sure you discuss any questions you have with your health care provider.   Document Released: 02/25/2005 Document Revised: 07/12/2014 Document Reviewed: 12/18/2012 Elsevier Interactive Patient Education [employer].

## 2015-01-31 NOTE — Progress Notes (Signed)
Subjective:    Patient ID: Deborah Knight, female    DOB: 04/05/1965, 49 y.o.   MRN: [id number]  HPI Deborah Knight, a 49 year old female presents for a follow-up of hypertension,  Hyperlipidemia, and medication refills.  She is not exercising and is adherent to low salt diet. Patient does not check blood pressures at home.  Patient denies chest pain, chest pressure/discomfort, near-syncope, orthopnea, palpitations, syncope and tachypnea.  Patient presents with hyperlipidemia. Her last labs showed Total cholesterol of 241, HDL 40, LDL 169 ,  Triglycerides 161. There is a family history of hyperlipidemia. There is not a family history of early ischemia heart disease. Patient reports that she has been eating smaller meals and increased water intake.   Past Medical History  Diagnosis Date  . Hypertension   . Anxiety   . Seasonal allergies   . HLD (hyperlipidemia)   . Asthma     Immunization History  Administered Date(s) Administered  . DTP 08/03/2004  . Influenza Split 04/09/2012  . Influenza Whole 02/09/2004, 02/03/2007, 02/22/2008, 06/12/2009, 01/23/2010  . Influenza,inj,Quad PF,36+ Mos 12/14/2012, 11/17/2013  . Pneumococcal Polysaccharide-23 01/11/2004, 06/12/2009  . Td 06/29/2009   Social History   Social History  . Marital Status: Single    Spouse Name: N/A  . Number of Children: N/A  . Years of Education: N/A   Occupational History  . Not on file.   Social History Main Topics  . Smoking status: Current Some Day Smoker    Types: Cigars  . Smokeless tobacco: Not on file  . Alcohol Use: No  . Drug Use: No  . Sexual Activity: Yes    Birth Control/ Protection: Condom, None   Other Topics Concern  . Not on file   Social History Narrative   Employed as a Education officer, environmentalchildcare worker.   Review of Systems  Constitutional: Negative.  Negative for unexpected weight change.  HENT: Negative.  Negative for postnasal drip.   Eyes: Negative.  Negative for visual  disturbance.  Respiratory: Negative.   Cardiovascular: Negative.   Gastrointestinal: Negative.  Negative for constipation.  Endocrine: Negative.  Negative for polydipsia, polyphagia and polyuria.  Genitourinary: Negative.   Musculoskeletal: Negative.  Negative for myalgias.  Skin: Negative.  Negative for rash.  Neurological: Negative.  Negative for dizziness, weakness and light-headedness.  Hematological: Negative.   Psychiatric/Behavioral: Positive for sleep disturbance (Occasional insomnia). Negative for suicidal ideas.       Objective:   Physical Exam  Constitutional: She is oriented to person, place, and time. She appears well-developed and well-nourished.  HENT:  Head: Normocephalic and atraumatic.  Right Ear: Hearing, tympanic membrane, external ear and ear canal normal.  Left Ear: Hearing, tympanic membrane, external ear and ear canal normal.  Mouth/Throat: Oropharynx is clear and moist.  Eyes: Conjunctivae and EOM are normal. Pupils are equal, round, and reactive to light.  Neck: Normal range of motion. Neck supple.  Cardiovascular: Normal rate, regular rhythm, normal heart sounds and intact distal pulses.   Pulmonary/Chest: Effort normal and breath sounds normal.  Abdominal: Soft. Bowel sounds are normal.  Musculoskeletal: Normal range of motion.  Neurological: She is alert and oriented to person, place, and time. She has normal reflexes.  Skin: Skin is warm and dry.  Psychiatric: She has a normal mood and affect. Her behavior is normal. Judgment and thought content normal.      BP 132/83 mmHg  Pulse 90  Temp(Src) 98.3 F (36.8 C) (Oral)  Resp 16  Ht   (1.575 m)  Wt 219 lb (99.338 kg)  BMI 40.05 kg/m2  LMP 08/13/2012 Assessment & Plan:  1. Essential hypertension Blood pressure is at goal on current medication regimen. Will continue current medication regimen. Patient has lost 12 pounds since previous visit. The patient is asked to make an attempt to improve  diet and exercise patterns to aid in medical management of this problem. - hydrochlorothiazide (HYDRODIURIL) 25 MG tablet; Take 1 tablet (25 mg total) by mouth daily.  Dispense: 90 tablet; Refill: 1 - amLODipine (NORVASC) 10 MG tablet; Take 1 tablet (10 mg total) by mouth daily.  Dispense: 90 tablet; Refill: 1 - POCT urinalysis dipstick - COMPLETE METABOLIC PANEL WITH GFR  2. Hyperlipidemia  - pravastatin (PRAVACHOL) 40 MG tablet; Take 1 tablet (40 mg total) by mouth daily.  Dispense: 90 tablet; Refill: 3 - Lipid Panel  3. Gastroesophageal reflux disease without esophagitis  - omeprazole (PRILOSEC) 20 MG capsule; Take 1 capsule (20 mg total) by mouth daily.  Dispense: 90 capsule; Refill: 3  4. Allergic rhinitis, unspecified allergic rhinitis type  - montelukast (SINGULAIR) 10 MG tablet; Take 1 tablet (10 mg total) by mouth at bedtime.  Dispense: 90 tablet; Refill: 3  5. Insomnia  - amitriptyline (ELAVIL) 25 MG tablet; Take 1 tablet (25 mg total) by mouth at bedtime as needed for sleep.  Dispense: 30 tablet; Refill: 0  6. Depression Depression is controlled on Zoloft. Patient is under the care of a therapist. She denies suicidal or homicidal ideations. Will follow up with therapist as previously scheduled.   7. Need for immunization against influenza - Flu Vaccine QUAD 36+ mos IM (Fluarix)  8. Tobacco abuse Smoking cessation instruction/counseling given:  counseled patient on the dangers of tobacco use, advised patient to stop smoking, and reviewed strategies to maximize success  RTC: Around 04/02/2015 for a pap smear  Tamora Huneke M, FNP

## 2015-02-01 ENCOUNTER — Telehealth: Payer: Self-pay

## 2015-02-01 NOTE — Telephone Encounter (Signed)
-----   Message from Massie MaroonLachina M Hollis, [place] sent at 02/01/2015  7:36 AM EST ----- Please inform Ms. Derrell Lollingngram that her potassium is mildly decreased. Add foods that are increased in potassium (banannas, green-leafy veggies, apricots, orange juice, etc). Patient is to follow up in office as scheduled.   Thanks  ----- Message -----    From: [facility] in [facility] Interface    Sent: 01/31/2015   9:54 PM      To: Massie MaroonLachina M Hollis, FNP

## 2015-02-01 NOTE — Telephone Encounter (Signed)
CALLED AND LEFT MESSAGE ASKING PATIENT TO CALL BACK REGARDING LABS. THANKS!  

## 2015-02-06 NOTE — Telephone Encounter (Signed)
Called and left message advising of decreased potassium and to increase bananas, green-leafy veggies in diet to help. Asked patient if any questions to call our office and left call back number. Thanks!

## 2015-03-27 ENCOUNTER — Ambulatory Visit: Payer: Self-pay | Admitting: Family Medicine

## 2015-04-03 ENCOUNTER — Ambulatory Visit: Payer: Self-pay | Admitting: Family Medicine

## 2015-04-18 ENCOUNTER — Other Ambulatory Visit: Payer: Self-pay | Admitting: Family Medicine

## 2015-10-25 ENCOUNTER — Other Ambulatory Visit: Payer: Self-pay

## 2015-10-25 ENCOUNTER — Ambulatory Visit: Payer: Self-pay

## 2015-10-25 ENCOUNTER — Other Ambulatory Visit: Payer: Self-pay | Admitting: Family Medicine

## 2015-10-25 VITALS — BP 143/99

## 2015-10-25 DIAGNOSIS — I10 Essential (primary) hypertension: Secondary | ICD-10-CM

## 2015-10-25 MED ORDER — AMLODIPINE BESYLATE 10 MG PO TABS: 10.0000 mg | ORAL_TABLET | Freq: Every day | ORAL | 0 refills | Status: DC

## 2015-10-25 MED ORDER — METOPROLOL TARTRATE 25 MG PO TABS: ORAL_TABLET | ORAL | 0 refills | Status: DC

## 2015-10-25 MED ORDER — HYDROCHLOROTHIAZIDE 25 MG PO TABS: 25.0000 mg | ORAL_TABLET | Freq: Every day | ORAL | 0 refills | Status: DC

## 2015-10-25 NOTE — Telephone Encounter (Signed)
Refill for bp meds sent in for one month and appointment was scheduled for 1 month follow up. Thanks!

## 2015-11-07 ENCOUNTER — Encounter (HOSPITAL_COMMUNITY): Payer: Self-pay

## 2015-11-07 ENCOUNTER — Emergency Department (HOSPITAL_COMMUNITY)
Admission: EM | Admit: 2015-11-07 | Discharge: 2015-11-07 | Disposition: A | Discharge status: Home / Self Care | Payer: Self-pay | Attending: Emergency Medicine | Admitting: Emergency Medicine

## 2015-11-07 DIAGNOSIS — T783XXD Angioneurotic edema, subsequent encounter: Secondary | ICD-10-CM

## 2015-11-07 DIAGNOSIS — F17210 Nicotine dependence, cigarettes, uncomplicated: Secondary | ICD-10-CM | POA: Insufficient documentation

## 2015-11-07 DIAGNOSIS — Z79899 Other long term (current) drug therapy: Secondary | ICD-10-CM | POA: Insufficient documentation

## 2015-11-07 DIAGNOSIS — I10 Essential (primary) hypertension: Secondary | ICD-10-CM | POA: Insufficient documentation

## 2015-11-07 DIAGNOSIS — J45909 Unspecified asthma, uncomplicated: Secondary | ICD-10-CM | POA: Insufficient documentation

## 2015-11-07 DIAGNOSIS — Z91040 Latex allergy status: Secondary | ICD-10-CM | POA: Insufficient documentation

## 2015-11-07 DIAGNOSIS — R220 Localized swelling, mass and lump, head: Secondary | ICD-10-CM

## 2015-11-07 DIAGNOSIS — Z7951 Long term (current) use of inhaled steroids: Secondary | ICD-10-CM | POA: Insufficient documentation

## 2015-11-07 MED ORDER — HYDROCORTISONE 1 % EX CREA: TOPICAL_CREAM | CUTANEOUS | 0 refills | Status: DC

## 2015-11-07 MED ORDER — METHYLPREDNISOLONE SODIUM SUCC 125 MG IJ SOLR
125.0000 mg | Freq: Once | INTRAMUSCULAR | Status: AC
  Administered 2015-11-07: 125 mg via INTRAVENOUS
  Filled 2015-11-07: qty 2

## 2015-11-07 MED ORDER — SODIUM CHLORIDE 0.9 % IV BOLUS (SEPSIS)
500.0000 mL | Freq: Once | INTRAVENOUS | Status: AC
  Administered 2015-11-07: 500 mL via INTRAVENOUS

## 2015-11-07 MED ORDER — PREDNISONE 50 MG PO TABS: ORAL_TABLET | ORAL | 0 refills | Status: DC

## 2015-11-07 MED ORDER — DIPHENHYDRAMINE HCL 50 MG/ML IJ SOLN
12.5000 mg | Freq: Once | INTRAMUSCULAR | Status: AC
  Administered 2015-11-07: 12.5 mg via INTRAVENOUS
  Filled 2015-11-07: qty 1

## 2015-11-07 MED ORDER — FAMOTIDINE IN NACL 20-0.9 MG/50ML-% IV SOLN
20.0000 mg | Freq: Once | INTRAVENOUS | Status: AC
  Administered 2015-11-07: 20 mg via INTRAVENOUS
  Filled 2015-11-07: qty 50

## 2015-11-07 NOTE — ED Notes (Signed)
Patient ambulatory to lobby. NAD noted.  

## 2015-11-07 NOTE — Discharge Instructions (Signed)
Take prednisone as prescribed. Continue taking home benadryl and apply hydrocortisone cream to rash for itch. Follow up with your PCP for re-evaluation and for referral to allergist or immunologist. Return to the ED if you experience severe worsening of your symptoms, increased swelling, difficulty breathing or swallowing. Use home epi-pen if needed.

## 2015-11-07 NOTE — ED Triage Notes (Signed)
Pt complains of a swollen lip and happened in the middle of the night, she has been through this for several years, she no longer takes lisinopril and was tod to quit eating certain foods. Pt states that is moves from side to side and is sire

## 2015-11-07 NOTE — ED Provider Notes (Signed)
[facility]-EMERGENCY DEPT Provider Note   CSN: [id number] Arrival date & time: 11/07/15  1191     History   Chief Complaint Chief Complaint  Patient presents with  . Oral Swelling    HPI Deborah Knight is a 50 y.o. female with a past medical history of angioedema, HTN, HLD , asthma, anxiety who presents to the ED today complaining of lip swelling. 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 She states yesterday morning she woke up in the left side of her lip and face was swollen. She took Benadryl with moderate relief her symptoms. However, today when she woke up the other side of her lip was swollen as well as her face. Patient states this is the first time this happened 2 days in a row and she became concerned. Patient states that she has seen multiple allergist and has been taken off several different medications. She no longer takes an ACE inhibitor. Patient states that they have not found a clear cause of her angioedema but state that it is hereditary. Patient has an EpiPen at home but has not had to use it. She denies any difficulty breathing or swallowing. Patient does have associated urticarial rash on her trunk that she states she gets often when her lips swell.  HPI  Past Medical History:  Diagnosis Date  . Anxiety   . Asthma   . HLD (hyperlipidemia)   . Hypertension   . Seasonal allergies     Patient Active Problem List   Diagnosis Date Noted  . Prediabetes 11/17/2013  . Unspecified vitamin D deficiency 09/23/2013  . Groin strain 09/14/2013  . Other malaise and fatigue 08/23/2013  . Metabolic syndrome 08/23/2013  . Back pain 08/23/2013  . Angioedema 01/06/2013  . Sprain and strain of shoulder and upper arm 10/09/2012  . Bicipital tenosynovitis 09/04/2012  . Joint derangement, shoulder region 07/10/2012  . Closed dislocation of shoulder 07/10/2012  . Dislocation of left shoulder joint 05/22/2012  . Right facial swelling  04/07/2012  . Rash 04/07/2012  . Tobacco abuse 02/04/2012  . KNEE PAIN, RIGHT 02/07/2010  . GERD 10/10/2009  . Depression 09/13/2009  . ALLERGIC RHINITIS 06/12/2009  . Obesity 08/30/2008  . LOW BACK PAIN SYNDROME 08/30/2008  . HYPERLIPIDEMIA 11/03/2006  . Anxiety state 11/03/2006  . Essential hypertension 11/03/2006  . Asthma 11/03/2006    Past Surgical History:  Procedure Laterality Date  . CESAREAN SECTION  1997  . HERNIA REPAIR  1989  . SHOULDER ARTHROSCOPY    . TUBAL LIGATION  1997    OB History    No data available       Home Medications    Prior to Admission medications   Medication Sig Start Date End Date Taking? Authorizing Provider  acetaminophen (TYLENOL) 500 MG tablet Take 1 tablet (500 mg total) by mouth every 6 (six) hours as needed for moderate pain. 05/28/14   [name], FNP  albuterol (PROVENTIL HFA;VENTOLIN HFA) 108 (90 BASE) MCG/ACT inhaler Inhale 2 puffs into the lungs every 6 (six) hours as needed for wheezing. Shortness of breath 04/08/14   [name], FNP  amitriptyline (ELAVIL) 25 MG tablet Take 1 tablet (25 mg total) by mouth at bedtime as needed for sleep. 01/31/15   [name], FNP  amLODipine (NORVASC) 10 MG tablet TAKE 1 TABLET (10 MG TOTAL) BY MOUTH DAILY. 04/18/15   [name], FNP  amLODipine (NORVASC) 10 MG tablet Take 1 tablet (  10 mg total) by mouth daily. 10/25/15   Henrietta HooverLinda C Bernhardt, NP  cetirizine (ZYRTEC) 10 MG tablet Take 1 tablet (10 mg total) by mouth daily. 08/23/13   Altha HarmMichelle A Matthews, MD  diphenhydrAMINE (BENADRYL) 12.5 MG/5ML elixir Take 5 mLs (12.5 mg total) by mouth 4 (four) times daily as needed for itching. 11/16/13   Massie MaroonLachina M Hollis, FNP  EPINEPHrine (EPIPEN) 0.3 mg/0.3 mL IJ SOAJ injection Inject 0.3 mg into the muscle daily as needed (allergic reation).     Historical Provider, MD  ergocalciferol (VITAMIN D2) 50000 UNITS capsule Take 1 capsule (50,000 Units total) by mouth once a week. Patient not taking:  Reported on 01/31/2015 08/24/13   Altha HarmMichelle A Matthews, MD  fluticasone [facility](FLONASE) 50 MCG/ACT nasal spray Place 2 sprays into both nostrils daily as needed (congestion). 08/23/13   Altha HarmMichelle A Matthews, MD  fluticasone-salmeterol (ADVAIR HFA) [phone] MCG/ACT inhaler Inhale 2 puffs into the lungs 2 (two) times daily. 08/23/13   Altha HarmMichelle A Matthews, MD  hydrochlorothiazide (HYDRODIURIL) 25 MG tablet TAKE 1 TABLET (25 MG TOTAL) BY MOUTH DAILY. 04/18/15   Massie MaroonLachina M Hollis, FNP  hydrochlorothiazide (HYDRODIURIL) 25 MG tablet Take 1 tablet (25 mg total) by mouth daily. 10/25/15   Henrietta HooverLinda C Bernhardt, NP  methocarbamol (ROBAXIN) 500 MG tablet Take 1 tablet (500 mg total) by mouth 2 (two) times daily. Patient taking differently: Take 500 mg by mouth every 6 (six) hours as needed for muscle spasms.  02/07/14   Tiffany Neva SeatGreene, PA-C  metoprolol tartrate (LOPRESSOR) 25 MG tablet TAKE 1 TABLET BY MOUTH 2 TIMES DAILY. 10/25/15   Henrietta HooverLinda C Bernhardt, NP  montelukast (SINGULAIR) 10 MG tablet Take 1 tablet (10 mg total) by mouth at bedtime. 01/31/15   Massie MaroonLachina M Hollis, FNP  omeprazole (PRILOSEC) 20 MG capsule Take 1 capsule (20 mg total) by mouth daily. 01/31/15   Massie MaroonLachina M Hollis, FNP  potassium chloride SA (K-DUR,KLOR-CON) 20 MEQ tablet Take 1 tablet (20 mEq total) by mouth daily. 08/24/13   Altha HarmMichelle A Matthews, MD  pravastatin (PRAVACHOL) 40 MG tablet Take 1 tablet (40 mg total) by mouth daily. 01/31/15   Massie MaroonLachina M Hollis, FNP  sertraline (ZOLOFT) 50 MG tablet TAKE 1.5 TABLET (75 MG TOTAL) BY MOUTH DAILY. Patient taking differently: Take 100 mg by mouth daily. TAKE 1.5 TABLET (75 MG TOTAL) BY MOUTH DAILY. 09/23/13   Altha HarmMichelle A Matthews, MD    Family History Family History  Problem Relation Age of Onset  . Asthma Father   . Cancer Father   . Asthma Sister   . Asthma Brother   . Asthma Maternal Grandmother   . Asthma Maternal Grandfather   . Asthma Paternal Grandmother   . Asthma Paternal Grandfather   . Hyperlipidemia  Mother   . Hyperlipidemia Sister     Social History Social History  Substance Use Topics  . Smoking status: Current Some Day Smoker    Types: Cigars  . Smokeless tobacco: Never Used  . Alcohol use No     Allergies   Losartan; Shellfish-derived products; Toradol [ketorolac tromethamine]; and Latex   Review of Systems Review of Systems  All other systems reviewed and are negative.    Physical Exam Updated Vital Signs BP (!) 153/107 (BP Location: Right Arm)   Pulse 100   Temp 98.3 F (36.8 C) (Oral)   Resp 20   LMP 08/13/2012   SpO2 99%   Physical Exam  Constitutional: She is oriented to person, place, and time. She appears well-developed  and well-nourished. No distress.  HENT:  Head: Normocephalic and atraumatic.  Lower lip swelling. No swelling under tongue or of throat.  Eyes: Conjunctivae are normal. Right eye exhibits no discharge. Left eye exhibits no discharge. No scleral icterus.  Neck: Neck supple.  Cardiovascular: Normal rate.   Pulmonary/Chest: Effort normal and breath sounds normal. No respiratory distress.  Lymphadenopathy:    She has no cervical adenopathy.  Neurological: She is alert and oriented to person, place, and time. Coordination normal.  Skin: Skin is warm and dry. Rash noted. She is not diaphoretic. No erythema. No pallor.  Psychiatric: She has a normal mood and affect. Her behavior is normal.  Nursing note and vitals reviewed.    ED Treatments / Results  Labs (all labs ordered are listed, but only abnormal results are displayed) Labs Reviewed - No data to display  EKG  EKG Interpretation None       Radiology No results found.  Procedures Procedures (including critical care time)  Medications Ordered in ED Medications  methylPREDNISolone sodium succinate (SOLU-MEDROL) 125 mg/2 mL injection 125 mg (not administered)  diphenhydrAMINE (BENADRYL) injection 12.5 mg (not administered)  famotidine (PEPCID) IVPB 20 mg premix (not  administered)  sodium chloride 0.9 % bolus 500 mL (not administered)     Initial Impression / Assessment and Plan / ED Course  I have reviewed the triage vital signs and the nursing notes.  Pertinent labs & imaging results that were available during my care of the patient were reviewed by me and considered in my medical decision making (see chart for details).  Clinical Course    50 year old female with angioedema. This is a recurrent phenomenon. She's not on ACE inhibitor. She reports she has been seen by an allergist but that testing did not reveal an exact etiology and she has been told that this is hereditary. She takes benadryl daily with minimal relief. Pt unsure of specific studies. Upon review of records, pt has had normal c1 esterase inhibitor studies, will not repeat today. Today pts swelling is isolated in her lower lip with associated urticarial rash on trunk.  No evidence of air way compromise, HD instability of other signs of anaphylaxis. Will tx with benadryl, solumedrol, pepcid and observe. Pt has epi-pens and understands indication for usage.   Pt [facility] with [facility] and states she will follow wup there for referral to allergist/immunologist.  No progression of symptoms or new complaints. HD stable and no signs of impeding airway compromise. Return precautions outlined in patient discharge instructions.    Final Clinical Impressions(s) / ED Diagnoses   Final diagnoses:  Angioedema, subsequent encounter  Lip swelling    New Prescriptions New Prescriptions   No medications on file     Dub Mikes, PA-C 11/07/15 1102    Bethann Berkshire, MD 11/11/15 [phone]

## 2015-11-22 ENCOUNTER — Encounter: Payer: Self-pay | Admitting: Family Medicine

## 2015-11-22 ENCOUNTER — Ambulatory Visit (INDEPENDENT_AMBULATORY_CARE_PROVIDER_SITE_OTHER): Payer: Self-pay | Admitting: Family Medicine

## 2015-11-22 VITALS — BP 129/91 | HR 78 | Temp 98.5°F | Resp 16 | Ht 62.0 in | Wt 232.0 lb

## 2015-11-22 DIAGNOSIS — I10 Essential (primary) hypertension: Secondary | ICD-10-CM

## 2015-11-22 DIAGNOSIS — Z1211 Encounter for screening for malignant neoplasm of colon: Secondary | ICD-10-CM

## 2015-11-22 DIAGNOSIS — Z131 Encounter for screening for diabetes mellitus: Secondary | ICD-10-CM

## 2015-11-22 DIAGNOSIS — Z23 Encounter for immunization: Secondary | ICD-10-CM

## 2015-11-22 DIAGNOSIS — Z1239 Encounter for other screening for malignant neoplasm of breast: Secondary | ICD-10-CM

## 2015-11-22 LAB — LIPID PANEL
CHOLESTEROL: 246 mg/dL — AB (ref 125–200)
HDL: 58 mg/dL (ref >=46)
LDL Cholesterol: 166 mg/dL — ABNORMAL HIGH (ref <130)
TRIGLYCERIDES: 110 mg/dL (ref <150)
Total CHOL/HDL Ratio: 4.2 Ratio (ref <=5.0)
VLDL: 22 mg/dL (ref <30)

## 2015-11-22 LAB — CBC WITH DIFFERENTIAL/PLATELET
BASOS ABS: 0 {cells}/uL (ref 0–200)
Basophils Relative: 0 %
EOS PCT: 5 %
Eosinophils Absolute: 255 cells/uL (ref 15–500)
HCT: 41 % (ref 35.0–45.0)
Hemoglobin: 13.7 g/dL (ref 11.7–15.5)
Lymphocytes Relative: 47 %
Lymphs Abs: 2397 cells/uL (ref 850–3900)
MCH: 28.4 pg (ref 27.0–33.0)
MCHC: 33.4 g/dL (ref 32.0–36.0)
MCV: 85.1 fL (ref 80.0–100.0)
MONOS PCT: 8 %
MPV: 10.2 fL (ref 7.5–12.5)
Monocytes Absolute: 408 cells/uL (ref 200–950)
NEUTROS ABS: 2040 {cells}/uL (ref 1500–7800)
NEUTROS PCT: 40 %
PLATELETS: 270 10*3/uL (ref 140–400)
RBC: 4.82 MIL/uL (ref 3.80–5.10)
RDW: 15.4 % — ABNORMAL HIGH (ref 11.0–15.0)
WBC: 5.1 10*3/uL (ref 3.8–10.8)

## 2015-11-22 LAB — COMPLETE METABOLIC PANEL WITH GFR
ALBUMIN: 3.9 g/dL (ref 3.6–5.1)
ALK PHOS: 72 U/L (ref 33–130)
ALT: 23 U/L (ref 6–29)
AST: 18 U/L (ref 10–35)
BILIRUBIN TOTAL: 0.3 mg/dL (ref 0.2–1.2)
BUN: 14 mg/dL (ref 7–25)
CALCIUM: 9.4 mg/dL (ref 8.6–10.4)
CO2: 28 mmol/L (ref 20–31)
Chloride: 105 mmol/L (ref 98–110)
Creat: 0.55 mg/dL (ref 0.50–1.05)
GFR, Est African American: >89 mL/min (ref >=60)
GLUCOSE: 89 mg/dL (ref 65–99)
Potassium: 3.3 mmol/L — ABNORMAL LOW (ref 3.5–5.3)
SODIUM: 141 mmol/L (ref 135–146)
TOTAL PROTEIN: 6.9 g/dL (ref 6.1–8.1)

## 2015-11-22 LAB — TSH: TSH: 0.66 mIU/L

## 2015-11-22 NOTE — Patient Instructions (Signed)
I will send in your refills later this afternoon.

## 2015-11-22 NOTE — Progress Notes (Signed)
Deborah Knight, is a 50 y.o. female  ZOX:[id number]  [id number]:[id number]  DOB - 12/11/1965  CC:  Chief Complaint  Patient presents with  . Hypertension  . Hyperlipidemia  . Hospitalization Follow-up    angioedema        HPI: Deborah Knight is a 50 y.o. female here for follow-up hypertension, and hyperlipidemia. She has a history of intermittent episodes of angioedema with no diagnosis as to why. She was seen recently in ED for same. She also as a history of allergic rhinitis and asthma, GERD  and several musculoskeletal issues. She is on hctz, amlodipine, metoprolol, albuterol, singular and prilosec. She denies needing refills at this time.  She has no specific complaints today. She does report some anxiety.  Allergies  Allergen Reactions  . Losartan Swelling    Lip swelling  . Shellfish-Derived Products     Other reaction(s): Angioedema (ALLERGY/intolerance)  . Toradol [Ketorolac Tromethamine] Swelling    Lip swelling  . Latex Rash   Past Medical History:  Diagnosis Date  . Anxiety   . Asthma   . HLD (hyperlipidemia)   . Hypertension   . Seasonal allergies    Current Outpatient Prescriptions on File Prior to Visit  Medication Sig Dispense Refill  . amLODipine (NORVASC) 10 MG tablet TAKE 1 TABLET (10 MG TOTAL) BY MOUTH DAILY. 90 tablet 0  . amLODipine (NORVASC) 10 MG tablet Take 1 tablet (10 mg total) by mouth daily. 30 tablet 0  . cetirizine (ZYRTEC) 10 MG tablet Take 1 tablet (10 mg total) by mouth daily. (Patient taking differently: Take 10 mg by mouth daily as needed for allergies. ) 90 tablet 3  . diphenhydrAMINE (BENADRYL) 12.5 MG/5ML elixir Take 5 mLs (12.5 mg total) by mouth 4 (four) times daily as needed for itching. 120 mL 0  . EPINEPHrine (EPIPEN) 0.3 mg/0.3 mL IJ SOAJ injection Inject 0.3 mg into the muscle daily as needed (allergic reation).     . fluticasone (FLONASE) 50 MCG/ACT nasal spray Place 2 sprays into both nostrils daily as needed (congestion).  16 g 3  . fluticasone-salmeterol (ADVAIR HFA) 115-21 MCG/ACT inhaler Inhale 2 puffs into the lungs 2 (two) times daily. 1 Inhaler 11  . hydrochlorothiazide (HYDRODIURIL) 25 MG tablet Take 1 tablet (25 mg total) by mouth daily. 30 tablet 0  . methocarbamol (ROBAXIN) 500 MG tablet Take 1 tablet (500 mg total) by mouth 2 (two) times daily. (Patient taking differently: Take 500 mg by mouth every 6 (six) hours as needed for muscle spasms. ) 20 tablet 0  . metoprolol tartrate (LOPRESSOR) 25 MG tablet TAKE 1 TABLET BY MOUTH 2 TIMES DAILY. (Patient taking differently: Take 25 mg by mouth 2 (two) times daily. ) 60 tablet 0  . omeprazole (PRILOSEC) 20 MG capsule Take 1 capsule (20 mg total) by mouth daily. (Patient taking differently: Take 20 mg by mouth daily as needed (heartburn/acid reflux). ) 90 capsule 3  . sertraline (ZOLOFT) 50 MG tablet TAKE 1.5 TABLET (75 MG TOTAL) BY MOUTH DAILY. (Patient taking differently: Take 100 mg by mouth daily. ) 90 tablet 1  . acetaminophen (TYLENOL) 500 MG tablet Take 1 tablet (500 mg total) by mouth every 6 (six) hours as needed for moderate pain. (Patient not taking: Reported on 11/22/2015) 30 tablet 0  . albuterol (PROVENTIL HFA;VENTOLIN HFA) 108 (90 BASE) MCG/ACT inhaler Inhale 2 puffs into the lungs every 6 (six) hours as needed for wheezing. Shortness of breath 1 Inhaler 2  . ergocalciferol (VITAMIN D2)  50000 UNITS capsule Take 1 capsule (50,000 Units total) by mouth once a week. (Patient not taking: Reported on 11/22/2015) 4 capsule 12  . hydrocortisone cream 1 % Apply to affected area 2 times daily (Patient not taking: Reported on 11/22/2015) 1.5 g 0  . montelukast (SINGULAIR) 10 MG tablet Take 1 tablet (10 mg total) by mouth at bedtime. (Patient not taking: Reported on 11/22/2015) 90 tablet 3  . potassium chloride SA (K-DUR,KLOR-CON) 20 MEQ tablet Take 1 tablet (20 mEq total) by mouth daily. (Patient not taking: Reported on 11/22/2015) 30 tablet 3  . pravastatin  (PRAVACHOL) 40 MG tablet Take 1 tablet (40 mg total) by mouth daily. (Patient not taking: Reported on 11/22/2015) 90 tablet 3  . predniSONE (DELTASONE) 50 MG tablet Take once daily for 5 days (Patient not taking: Reported on 11/22/2015) 5 tablet 0   No current facility-administered medications on file prior to visit.    Family History  Problem Relation Age of Onset  . Asthma Father   . Cancer Father   . Asthma Sister   . Asthma Brother   . Asthma Maternal Grandmother   . Asthma Maternal Grandfather   . Asthma Paternal Grandmother   . Asthma Paternal Grandfather   . Hyperlipidemia Mother   . Hyperlipidemia Sister    Social History   Social History  . Marital status: Single    Spouse name: N/A  . Number of children: N/A  . Years of education: N/A   Occupational History  . Not on file.   Social History Main Topics  . Smoking status: Former Smoker    Types: Cigars    Quit date: 11/09/2015  . Smokeless tobacco: Never Used  . Alcohol use No  . Drug use: No  . Sexual activity: Yes    Birth control/ protection: Condom, None   Other Topics Concern  . Not on file   Social History Narrative   Employed as a Education officer, environmentalchildcare worker.    Review of Systems: Constitutional: Negative Skin: Negative HENT: Negative  Eyes: Negative  Neck: Negative Respiratory: Negative Cardiovascular: Negative Gastrointestinal: Negative Genitourinary: Negative  Musculoskeletal: Negative   Neurological: Negative for Hematological: Negative  Psychiatric/Behavioral: Negative    Objective:   Vitals:   11/22/15 1043  BP: (!) 129/91  Pulse: 78  Resp: 16  Temp: 98.5 F (36.9 C)    Physical Exam: Constitutional: Patient appears well-developed and well-nourished. No distress. HENT: Normocephalic, atraumatic, External right and left ear normal. Oropharynx is clear and moist.  Eyes: Conjunctivae and EOM are normal. PERRLA, no scleral icterus. Neck: Normal ROM. Neck supple. No lymphadenopathy, No  thyromegaly. CVS: RRR, S1/S2 +, no murmurs, no gallops, no rubs Pulmonary: Effort and breath sounds normal, no stridor, rhonchi, wheezes, rales.  Abdominal: Soft. Normoactive BS,, no distension, tenderness, rebound or guarding.  Musculoskeletal: Normal range of motion. No edema and no tenderness.  Neuro: Alert.Normal muscle tone coordination. Non-focal Skin: Skin is warm and dry. No rash noted. Not diaphoretic. No erythema. No pallor. Psychiatric: Normal mood and affect. Behavior, judgment, thought content normal.  Lab Results  Component Value Date   WBC 5.4 12/06/2013   HGB 14.3 10/18/2014   HCT 42.0 10/18/2014   MCV 83.8 12/06/2013   PLT 261 12/06/2013   Lab Results  Component Value Date   CREATININE 0.47 (L) 01/31/2015   BUN 10 01/31/2015   NA 139 01/31/2015   K 3.3 (L) 01/31/2015   CL 101 01/31/2015   CO2 28 01/31/2015  Lab Results  Component Value Date   HGBA1C 5.9 (H) 08/23/2013   Lipid Panel     Component Value Date/Time   CHOL 237 (H) 01/31/2015 1112   TRIG 98 01/31/2015 1112   HDL 47 01/31/2015 1112   CHOLHDL 5.0 01/31/2015 1112   VLDL 20 01/31/2015 1112   LDLCALC 170 (H) 01/31/2015 1112       Assessment and plan:   1. Essential hypertension  - COMPLETE METABOLIC PANEL WITH GFR - CBC with Differential - Lipid panel - TSH  2. Screening for diabetes mellitus  - Hemoglobin A1c  3. Screening for breast cancer   DIGITAL SCREENING BILATERAL; Future  4. Screening for colon cancer - Hemoccult Bld/Stl (3-Cd Home Screen); Future  5. Encounter for immunization  - Flu Vaccine QUAD 36+ mos IM   Return in about 6 months (around 05/21/2016) for HTN.  The patient was given clear instructions to go to ER or return to medical center if symptoms don't improve, worsen or new problems develop. The patient verbalized understanding.    SALIMATA CHRISTENSON FNP  11/22/2015, 4:19 PM

## 2015-11-23 ENCOUNTER — Other Ambulatory Visit: Payer: Self-pay | Admitting: Family Medicine

## 2015-11-23 ENCOUNTER — Other Ambulatory Visit: Payer: Self-pay

## 2015-11-23 LAB — HEMOGLOBIN A1C
Hgb A1c MFr Bld: 5.7 % — ABNORMAL HIGH (ref <5.7)
Mean Plasma Glucose: 117 mg/dL

## 2015-11-23 MED ORDER — SIMVASTATIN 20 MG PO TABS: 20.0000 mg | ORAL_TABLET | Freq: Every day | ORAL | 1 refills | Status: DC

## 2015-11-23 NOTE — Telephone Encounter (Signed)
Sent simvastatin to correct pharmacy. Thanks!

## 2015-12-01 ENCOUNTER — Emergency Department (HOSPITAL_COMMUNITY): Payer: No Typology Code available for payment source

## 2015-12-01 ENCOUNTER — Emergency Department (HOSPITAL_COMMUNITY)
Admission: EM | Admit: 2015-12-01 | Discharge: 2015-12-01 | Disposition: A | Discharge status: Home / Self Care | Payer: No Typology Code available for payment source | Attending: Emergency Medicine | Admitting: Emergency Medicine

## 2015-12-01 ENCOUNTER — Encounter (HOSPITAL_COMMUNITY): Payer: Self-pay | Admitting: Emergency Medicine

## 2015-12-01 DIAGNOSIS — Y939 Activity, unspecified: Secondary | ICD-10-CM | POA: Insufficient documentation

## 2015-12-01 DIAGNOSIS — S4992XA Unspecified injury of left shoulder and upper arm, initial encounter: Secondary | ICD-10-CM | POA: Diagnosis present

## 2015-12-01 DIAGNOSIS — S8992XA Unspecified injury of left lower leg, initial encounter: Secondary | ICD-10-CM | POA: Insufficient documentation

## 2015-12-01 DIAGNOSIS — J45909 Unspecified asthma, uncomplicated: Secondary | ICD-10-CM | POA: Diagnosis not present

## 2015-12-01 DIAGNOSIS — S59902A Unspecified injury of left elbow, initial encounter: Secondary | ICD-10-CM | POA: Diagnosis not present

## 2015-12-01 DIAGNOSIS — Z79899 Other long term (current) drug therapy: Secondary | ICD-10-CM | POA: Diagnosis not present

## 2015-12-01 DIAGNOSIS — I10 Essential (primary) hypertension: Secondary | ICD-10-CM | POA: Diagnosis not present

## 2015-12-01 DIAGNOSIS — Y92410 Unspecified street and highway as the place of occurrence of the external cause: Secondary | ICD-10-CM | POA: Insufficient documentation

## 2015-12-01 DIAGNOSIS — Z91040 Latex allergy status: Secondary | ICD-10-CM | POA: Diagnosis not present

## 2015-12-01 DIAGNOSIS — Z87891 Personal history of nicotine dependence: Secondary | ICD-10-CM | POA: Diagnosis not present

## 2015-12-01 DIAGNOSIS — Y999 Unspecified external cause status: Secondary | ICD-10-CM | POA: Diagnosis not present

## 2015-12-01 MED ORDER — METHOCARBAMOL 500 MG PO TABS: 500.0000 mg | ORAL_TABLET | Freq: Two times a day (BID) | ORAL | 0 refills | Status: DC

## 2015-12-01 MED ORDER — IBUPROFEN 400 MG PO TABS
600.0000 mg | ORAL_TABLET | Freq: Once | ORAL | Status: AC
  Administered 2015-12-01: 600 mg via ORAL
  Filled 2015-12-01: qty 1

## 2015-12-01 MED ORDER — IBUPROFEN 600 MG PO TABS: 600.0000 mg | ORAL_TABLET | Freq: Four times a day (QID) | ORAL | 0 refills | Status: DC | PRN
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
## 2015-12-01 NOTE — Discharge Instructions (Signed)
Take medications as prescribed as needed for pain. I also recommend applying ice to affected pain for 15 minutes 3-4 times daily for pain relief.  Please follow up with a primary care provider from the Resource Guide provided below in 1 week as needed. Please return to the Emergency Department if symptoms worsen or new onset of fever, numbness, tingling, saddle anesthesia, loss of bowel or bladder, weakness.
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
## 2016-02-18 ENCOUNTER — Other Ambulatory Visit: Payer: Self-pay | Admitting: Family Medicine

## 2016-02-18 DIAGNOSIS — I10 Essential (primary) hypertension: Secondary | ICD-10-CM

## 2016-03-24 ENCOUNTER — Emergency Department (HOSPITAL_COMMUNITY)
Admission: EM | Admit: 2016-03-24 | Discharge: 2016-03-24 | Disposition: A | Discharge status: Home / Self Care | Payer: Self-pay | Attending: Emergency Medicine | Admitting: Emergency Medicine

## 2016-03-24 ENCOUNTER — Encounter (HOSPITAL_COMMUNITY): Payer: Self-pay | Admitting: Emergency Medicine

## 2016-03-24 DIAGNOSIS — Z87891 Personal history of nicotine dependence: Secondary | ICD-10-CM | POA: Insufficient documentation

## 2016-03-24 DIAGNOSIS — J45909 Unspecified asthma, uncomplicated: Secondary | ICD-10-CM | POA: Insufficient documentation

## 2016-03-24 DIAGNOSIS — R21 Rash and other nonspecific skin eruption: Secondary | ICD-10-CM | POA: Insufficient documentation

## 2016-03-24 DIAGNOSIS — I10 Essential (primary) hypertension: Secondary | ICD-10-CM | POA: Insufficient documentation

## 2016-03-24 DIAGNOSIS — Z91040 Latex allergy status: Secondary | ICD-10-CM | POA: Insufficient documentation

## 2016-03-24 DIAGNOSIS — Z79899 Other long term (current) drug therapy: Secondary | ICD-10-CM | POA: Insufficient documentation

## 2016-03-24 MED ORDER — PREDNISONE 10 MG (21) PO TBPK: 10.0000 mg | ORAL_TABLET | Freq: Every day | ORAL | 0 refills | Status: DC

## 2016-03-24 MED ORDER — DIPHENHYDRAMINE HCL 50 MG/ML IJ SOLN
50.0000 mg | Freq: Once | INTRAMUSCULAR | Status: AC
  Administered 2016-03-24: 50 mg via INTRAVENOUS
  Filled 2016-03-24: qty 1

## 2016-03-24 MED ORDER — SODIUM CHLORIDE 0.9 % IV BOLUS (SEPSIS)
1000.0000 mL | Freq: Once | INTRAVENOUS | Status: AC
  Administered 2016-03-24: 1000 mL via INTRAVENOUS

## 2016-03-24 MED ORDER — DEXAMETHASONE SODIUM PHOSPHATE 10 MG/ML IJ SOLN
10.0000 mg | Freq: Once | INTRAMUSCULAR | Status: AC
  Administered 2016-03-24: 10 mg via INTRAVENOUS
  Filled 2016-03-24: qty 1

## 2016-03-24 MED ORDER — TRIAMCINOLONE ACETONIDE 0.1 % EX CREA: 1.0000 "application " | TOPICAL_CREAM | Freq: Two times a day (BID) | CUTANEOUS | 0 refills | Status: DC

## 2016-03-24 NOTE — ED Provider Notes (Signed)
[facility]-EMERGENCY DEPT Provider Note   CSN: [id number] Arrival date & time: 03/24/16  [id number]     History   Chief Complaint Chief Complaint  Patient presents with  . Rash    HPI Deborah Knight is a 51 y.o. female with past medical history of hypertension, hyperlipidemia, asthma, GERD, depression and recurrent angioedema with unknown etiology presents to the emergency department with red, burning, itching rash and upper extremities, lower extremities anterior and posterior trunk 2 weeks. Patient rash started in RLE and then progressed to all of her body.  Pt states she has had difficulty swallowing her saliva, pt states "it takes more effort to swallow my spit" specially at night, this started with the rash 2 weeks ago.  Pt states that she has broken out in rashes before and doctors haven't been able to figure out why or how to prevent it.  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  Pt has h/o of lip swelling that was treated in ED and she ws discharged home. Pt has never been intubated for breathing problems from angioedema. Pt has an expired epipen at home.  Pt states she usually uses triamcinolone cream, Benadryl, Claritin to reduce her symptoms however her rash has only been getting worse. Patient denies shortness of breath, chest pain, facial swelling.  Pt denies fevers, chest pain, shortness of breath, lip/tongue swelling, drooling, voice changes, n/v/d/c. No new changes in lotions, shampoo, laundry detergent. Pt has been washing her body with only water. Pt states only medication change was 2 months ago (citalopram for depression).   HPI  Past Medical History:  Diagnosis Date  . Anxiety   . Asthma   . HLD (hyperlipidemia)   . Hypertension   . Seasonal allergies     Patient Active Problem List   Diagnosis Date Noted  . Prediabetes 11/17/2013  . Unspecified vitamin D deficiency 09/23/2013  . Groin strain  09/14/2013  . Other malaise and fatigue 08/23/2013  . Metabolic syndrome 08/23/2013  . Back pain 08/23/2013  . Angioedema 01/06/2013  . Sprain and strain of shoulder and upper arm 10/09/2012  . Bicipital tenosynovitis 09/04/2012  . Joint derangement, shoulder region 07/10/2012  . Closed dislocation of shoulder 07/10/2012  . Dislocation of left shoulder joint 05/22/2012  . Right facial swelling 04/07/2012  . Rash 04/07/2012  . Tobacco abuse 02/04/2012  . KNEE PAIN, RIGHT 02/07/2010  . GERD 10/10/2009  . Depression 09/13/2009  . ALLERGIC RHINITIS 06/12/2009  . Obesity 08/30/2008  . LOW BACK PAIN SYNDROME 08/30/2008  . HYPERLIPIDEMIA 11/03/2006  . Anxiety state 11/03/2006  . Essential hypertension 11/03/2006  . Asthma 11/03/2006    Past Surgical History:  Procedure Laterality Date  . CESAREAN SECTION  1997  . HERNIA REPAIR  1989  . SHOULDER ARTHROSCOPY    . TUBAL LIGATION  1997    OB History    No data available       Home Medications    Prior to Admission medications   Medication Sig Start Date End Date Taking? Authorizing Provider  albuterol (PROVENTIL HFA;VENTOLIN HFA) 108 (90 BASE) MCG/ACT inhaler Inhale 2 puffs into the lungs every 6 (six) hours as needed for wheezing. Shortness of breath 04/08/14  Yes Massie MaroonLachina M Hollis, FNP  amLODipine (NORVASC) 10 MG tablet TAKE 1 TABLET (10 MG TOTAL) BY MOUTH DAILY. 04/18/15  Yes Massie MaroonLachina M Hollis, FNP  citalopram (CELEXA) 20 MG tablet Take 20 mg  by mouth daily.   Yes Historical Provider, MD  diphenhydrAMINE (BENADRYL) 25 MG tablet Take 50 mg by mouth every 6 (six) hours as needed.   Yes Historical Provider, MD  fluticasone (FLONASE) 50 MCG/ACT nasal spray Place 2 sprays into both nostrils daily as needed (congestion). 08/23/13  Yes [name], MD  fluticasone-salmeterol (ADVAIR HFA) [phone] MCG/ACT inhaler Inhale 2 puffs into the lungs 2 (two) times daily. 08/23/13  Yes [name], MD  hydrochlorothiazide (HYDRODIURIL)  25 MG tablet TAKE ONE TABLET BY MOUTH DAILY 02/19/16  Yes [name], NP  ibuprofen (ADVIL,MOTRIN) 600 MG tablet Take 1 tablet (600 mg total) by mouth every 6 (six) hours as needed. 12/01/15  Yes Satira Sark Nadeau, PA-C  loratadine (CLARITIN) 10 MG tablet Take 10 mg by mouth daily.   Yes Historical Provider, MD  methocarbamol (ROBAXIN) 500 MG tablet Take 1 tablet (500 mg total) by mouth 2 (two) times daily. 12/01/15  Yes Barrett Henle, PA-C  metoprolol tartrate (LOPRESSOR) 25 MG tablet TAKE 1 TABLET BY MOUTH 2 TIMES DAILY. Patient taking differently: Take 25 mg by mouth 2 (two) times daily.  10/25/15  Yes [name], NP  potassium chloride SA (K-DUR,KLOR-CON) 20 MEQ tablet Take 1 tablet (20 mEq total) by mouth daily. 08/24/13  Yes [name], MD  acetaminophen (TYLENOL) 500 MG tablet Take 1 tablet (500 mg total) by mouth every 6 (six) hours as needed for moderate pain. Patient not taking: Reported on 03/24/2016 05/28/14   [name], FNP  amLODipine (NORVASC) 10 MG tablet Take 1 tablet (10 mg total) by mouth daily. Patient not taking: Reported on 03/24/2016 10/25/15   [name], NP  amLODipine (NORVASC) 10 MG tablet TAKE ONE TABLET BY MOUTH DAILY Patient not taking: Reported on 03/24/2016 02/19/16   [name], NP  cetirizine (ZYRTEC) 10 MG tablet Take 1 tablet (10 mg total) by mouth daily. Patient not taking: Reported on 03/24/2016 08/23/13   [name], MD  diphenhydrAMINE (BENADRYL) 12.5 MG/5ML elixir Take 5 mLs (12.5 mg total) by mouth 4 (four) times daily as needed for itching. Patient not taking: Reported on 03/24/2016 11/16/13   [name], FNP  EPINEPHrine (EPIPEN) 0.3 mg/0.3 mL IJ SOAJ injection Inject 0.3 mg into the muscle daily as needed (allergic reation).     Historical Provider, MD  ergocalciferol (VITAMIN D2) 50000 UNITS capsule Take 1 capsule (50,000 Units total) by mouth once a week. Patient not taking: Reported on  03/24/2016 08/24/13   [name], MD  hydrocortisone cream 1 % Apply to affected area 2 times daily Patient not taking: Reported on 03/24/2016 11/07/15   [name], PA-C  montelukast (SINGULAIR) 10 MG tablet Take 1 tablet (10 mg total) by mouth at bedtime. Patient not taking: Reported on 03/24/2016 01/31/15   [name], FNP  omeprazole (PRILOSEC) 20 MG capsule Take 1 capsule (20 mg total) by mouth daily. Patient not taking: Reported on 03/24/2016 01/31/15   [name], FNP  pravastatin (PRAVACHOL) 40 MG tablet Take 1 tablet (40 mg total) by mouth daily. Patient not taking: Reported on 03/24/2016 01/31/15   [name], FNP  predniSONE (STERAPRED UNI-PAK 21 TAB) 10 MG (21) TBPK tablet Take 1 tablet (10 mg total) by mouth daily. Take 6 tabs by mouth daily  for 2 days, then 5 tabs for 2 days, then 4 tabs for 2 days, then 3 tabs for 2 days, 2 tabs  for 2 days, then 1 tab by mouth daily for 2 days 03/24/16   Liberty Handy, PA-C  sertraline (ZOLOFT) 50 MG tablet TAKE 1.5 TABLET (75 MG TOTAL) BY MOUTH DAILY. Patient not taking: Reported on 03/24/2016 09/23/13   [name], MD  simvastatin (ZOCOR) 20 MG tablet Take 1 tablet (20 mg total) by mouth at bedtime. Patient not taking: Reported on 03/24/2016 11/23/15   [name], NP  triamcinolone cream (KENALOG) 0.1 % Apply 1 application topically 2 (two) times daily. 03/24/16   Liberty Handy, PA-C    Family History Family History  Problem Relation Age of Onset  . Asthma Father   . Cancer Father   . Asthma Sister   . Asthma Brother   . Asthma Maternal Grandmother   . Asthma Maternal Grandfather   . Asthma Paternal Grandmother   . Asthma Paternal Grandfather   . Hyperlipidemia Mother   . Hyperlipidemia Sister     Social History Social History  Substance Use Topics  . Smoking status: Former Smoker    Types: Cigars    Quit date: 11/09/2015  . Smokeless tobacco: Never Used  . Alcohol use No      Allergies   Losartan; Shellfish-derived products; Toradol [ketorolac tromethamine]; and Latex   Review of Systems Review of Systems  Constitutional: Negative for appetite change, chills and fever.  HENT: Negative for congestion and sore throat.   Eyes: Negative for visual disturbance.  Respiratory: Negative for cough, choking, chest tightness and shortness of breath.   Cardiovascular: Negative for chest pain, palpitations and leg swelling.  Gastrointestinal: Negative for abdominal pain, constipation, diarrhea, nausea and vomiting.  Genitourinary: Negative for difficulty urinating and hematuria.  Musculoskeletal: Negative for arthralgias.  Skin: Positive for rash. Negative for wound.  Neurological: Negative for dizziness, seizures, syncope, weakness, light-headedness, numbness and headaches.  Hematological: Does not bruise/bleed easily.  Psychiatric/Behavioral: Negative.      Physical Exam Updated Vital Signs BP 113/78 (BP Location: Right Arm)   Pulse 73   Temp 97.6 F (36.4 C) (Oral)   Resp 16   Ht 5\' 5"  (1.651 m)   Wt 103.4 kg   LMP 08/13/2012   SpO2 96%   BMI 37.94 kg/m   Physical Exam  Constitutional: She is oriented to person, place, and time. She appears well-developed and well-nourished. No distress.  Pt speaking in full sentences. NAD.  HENT:  Head: Normocephalic and atraumatic.  Moist mucous membranes.  No obvious lip or tongue edema. Oropharynx and tonsils prink without edema, erythema, exudates. Uvula midline. No oral lesions.  Eyes: Conjunctivae and EOM are normal. Pupils are equal, round, and reactive to light.  Neck: Normal range of motion. Neck supple. No JVD present.  Cardiovascular: Normal rate, regular rhythm, normal heart sounds and intact distal pulses.   No murmur heard. Pulmonary/Chest: Effort normal and breath sounds normal. No respiratory distress. She has no wheezes. She has no rales. She exhibits no tenderness.  Abdominal: Soft. Bowel  sounds are normal. She exhibits no distension and no mass. There is no tenderness. There is no rebound and no guarding.  Musculoskeletal: Normal range of motion. She exhibits no deformity.  Lymphadenopathy:    She has no cervical adenopathy.  Neurological: She is alert and oriented to person, place, and time. No sensory deficit.  Skin: Skin is warm and dry. Capillary refill takes less than 2 seconds.  Erythematous and raised maculopapular, wheal like blanching rash diffusely in upper extremities, lower extremities, anterior  and posterior trunk. One single lesion below R eye.   Psychiatric: She has a normal mood and affect. Her behavior is normal. Judgment and thought content normal.  Nursing note and vitals reviewed.      ED Treatments / Results  Labs (all labs ordered are listed, but only abnormal results are displayed) Labs Reviewed - No data to display  EKG  EKG Interpretation None       Radiology No results found.  Procedures Procedures (including critical care time)  Medications Ordered in ED Medications  sodium chloride 0.9 % bolus 1,000 mL (0 mLs Intravenous Stopped 03/24/16 1530)  diphenhydrAMINE (BENADRYL) injection 50 mg (50 mg Intravenous Given 03/24/16 1316)  dexamethasone (DECADRON) injection 10 mg (10 mg Intravenous Given 03/24/16 1316)     Initial Impression / Assessment and Plan / ED Course  I have reviewed the triage vital signs and the nursing notes.  Pertinent labs & imaging results that were available during my care of the patient were reviewed by me and considered in my medical decision making (see chart for details).  Clinical Course    51 year old female with pertinent past medical history of recurrent angioedema with unknown etiology presents today with pruritic, erythematous, tender rash diffusely on lower extremities, upper extremities and anterior/posterior trunk. Please see attached image. No perioral lesions. No rash on palms of hands or soles  of feet. Patient speaking in full sentences. Patient does not appear to have increased breathing effort. Patient was given Benadryl, Decadron on fluids in the ED and observed for 2 hours.   Pt was re-evaluated, found sleeping on bed. Patient reported improvement in itching. Patient denies difficulty breathing, shortness of breath, difficulty swallowing saliva. Patient tolerated by mouth fluids and solids in the ED. Patient ambulated in the ED without difficulty. I think patient is safe to be discharged with prednisone and triamcinolone and Benadryl for symptoms. Patient given strict ED return instructions. Patient verbalized understanding and is agreeable to discharge. Patient discussed with Dr. Verdie Mosher who agrees with ED treatment and discharge plan.  Final Clinical Impressions(s) / ED Diagnoses   Final diagnoses:  Rash    New Prescriptions Discharge Medication List as of 03/24/2016  3:23 PM    START taking these medications   Details  predniSONE (STERAPRED UNI-PAK 21 TAB) 10 MG (21) TBPK tablet Take 1 tablet (10 mg total) by mouth daily. Take 6 tabs by mouth daily  for 2 days, then 5 tabs for 2 days, then 4 tabs for 2 days, then 3 tabs for 2 days, 2 tabs for 2 days, then 1 tab by mouth daily for 2 days, Starting Sun 03/24/2016, Print    triamcinolone cream (KENALOG) 0.1 % Apply 1 application topically 2 (two) times daily., Starting Sun 03/24/2016, Print         Liberty Handy, PA-C 03/24/16 1555    Lavera Guise, MD 03/25/16 [phone]

## 2016-03-24 NOTE — Discharge Instructions (Signed)
You have been prescribed a short course of prednisone and triamcinolone cream for your rash. Please use these medications as prescribed. You may also take Benadryl as needed for itching. Return to the emergency department if you develop tongue or lip swelling, difficulty breathing, difficulty tolerating your oral secretions.

## 2016-03-24 NOTE — ED Notes (Signed)
Pt drank and had crackers without difficulty.  Additional ginger ale and graham crackers given.

## 2016-03-24 NOTE — ED Notes (Signed)
Ginger-ale crackers given

## 2016-03-24 NOTE — ED Triage Notes (Signed)
Pt c/o painful large red wheals across legs, arms, thorax, back x 2 weeks. Palms bright red, palms and soles painful and tingling. Lesions are preceded by tinging sensation. SOB when laying down. Difficulty swallowing saliva, which has been rather constant over past two weeks. Lungs clear, no wheezing. In control of oral secretions. Hx hereditary angioedema, but this is not her usual presentation of angioedema. Self-treating with benadryl and claritin without success.

## 2016-03-24 NOTE — ED Notes (Signed)
EDPA Provider at bedside. 

## 2016-03-28 ENCOUNTER — Other Ambulatory Visit: Payer: Self-pay | Admitting: Family Medicine

## 2016-03-28 ENCOUNTER — Telehealth: Payer: Self-pay

## 2016-03-28 DIAGNOSIS — I10 Essential (primary) hypertension: Secondary | ICD-10-CM

## 2016-03-28 MED ORDER — HYDROCHLOROTHIAZIDE 25 MG PO TABS: 25.0000 mg | ORAL_TABLET | Freq: Every day | ORAL | 0 refills | Status: DC

## 2016-03-28 MED ORDER — AMLODIPINE BESYLATE 10 MG PO TABS: ORAL_TABLET | ORAL | 0 refills | Status: DC

## 2016-03-28 MED ORDER — AMLODIPINE BESYLATE 10 MG PO TABS: 10.0000 mg | ORAL_TABLET | Freq: Every day | ORAL | 0 refills | Status: DC

## 2016-05-21 ENCOUNTER — Ambulatory Visit: Payer: Self-pay | Admitting: Family Medicine

## 2016-05-27 ENCOUNTER — Ambulatory Visit (INDEPENDENT_AMBULATORY_CARE_PROVIDER_SITE_OTHER): Payer: Self-pay | Admitting: Family Medicine

## 2016-05-27 ENCOUNTER — Encounter: Payer: Self-pay | Admitting: Family Medicine

## 2016-05-27 VITALS — BP 147/90 | Temp 98.2°F | Resp 16 | Ht 62.0 in | Wt 236.0 lb

## 2016-05-27 DIAGNOSIS — Z720 Tobacco use: Secondary | ICD-10-CM

## 2016-05-27 DIAGNOSIS — G629 Polyneuropathy, unspecified: Secondary | ICD-10-CM

## 2016-05-27 DIAGNOSIS — I10 Essential (primary) hypertension: Secondary | ICD-10-CM

## 2016-05-27 DIAGNOSIS — E784 Other hyperlipidemia: Secondary | ICD-10-CM

## 2016-05-27 DIAGNOSIS — J302 Other seasonal allergic rhinitis: Secondary | ICD-10-CM

## 2016-05-27 DIAGNOSIS — J4520 Mild intermittent asthma, uncomplicated: Secondary | ICD-10-CM

## 2016-05-27 DIAGNOSIS — E7849 Other hyperlipidemia: Secondary | ICD-10-CM

## 2016-05-27 DIAGNOSIS — R7303 Prediabetes: Secondary | ICD-10-CM

## 2016-05-27 DIAGNOSIS — L309 Dermatitis, unspecified: Secondary | ICD-10-CM

## 2016-05-27 LAB — POCT URINALYSIS DIP (DEVICE)
Bilirubin Urine: NEGATIVE
GLUCOSE, UA: NEGATIVE mg/dL
HGB URINE DIPSTICK: NEGATIVE
Ketones, ur: NEGATIVE mg/dL
LEUKOCYTES UA: NEGATIVE
NITRITE: NEGATIVE
Protein, ur: NEGATIVE mg/dL
Specific Gravity, Urine: 1.015 (ref 1.005–1.030)
UROBILINOGEN UA: 1 mg/dL (ref 0.0–1.0)
pH: 7.5 (ref 5.0–8.0)

## 2016-05-27 LAB — COMPLETE METABOLIC PANEL WITH GFR
ALT: 26 U/L (ref 6–29)
AST: 22 U/L (ref 10–35)
Albumin: 4 g/dL (ref 3.6–5.1)
Alkaline Phosphatase: 77 U/L (ref 33–130)
BILIRUBIN TOTAL: 0.3 mg/dL (ref 0.2–1.2)
BUN: 12 mg/dL (ref 7–25)
CO2: 27 mmol/L (ref 20–31)
CREATININE: 0.45 mg/dL — AB (ref 0.50–1.05)
Calcium: 9.2 mg/dL (ref 8.6–10.4)
Chloride: 108 mmol/L (ref 98–110)
GFR, Est Non African American: >89 mL/min (ref >=60)
Glucose, Bld: 95 mg/dL (ref 65–99)
Potassium: 3.4 mmol/L — ABNORMAL LOW (ref 3.5–5.3)
Sodium: 141 mmol/L (ref 135–146)
TOTAL PROTEIN: 6.8 g/dL (ref 6.1–8.1)

## 2016-05-27 LAB — LIPID PANEL
Cholesterol: 210 mg/dL — ABNORMAL HIGH (ref <200)
HDL: 50 mg/dL — ABNORMAL LOW (ref >50)
LDL CALC: 135 mg/dL — AB (ref <100)
TRIGLYCERIDES: 123 mg/dL (ref <150)
Total CHOL/HDL Ratio: 4.2 Ratio (ref <5.0)
VLDL: 25 mg/dL (ref <30)

## 2016-05-27 LAB — POCT GLYCOSYLATED HEMOGLOBIN (HGB A1C): HEMOGLOBIN A1C: 5.8

## 2016-05-27 MED ORDER — METOPROLOL TARTRATE 25 MG PO TABS: ORAL_TABLET | ORAL | 5 refills | Status: DC

## 2016-05-27 MED ORDER — AMLODIPINE BESYLATE 10 MG PO TABS: 10.0000 mg | ORAL_TABLET | Freq: Every day | ORAL | 5 refills | Status: DC

## 2016-05-27 MED ORDER — CETIRIZINE HCL 10 MG PO TABS: 10.0000 mg | ORAL_TABLET | Freq: Every day | ORAL | 11 refills | Status: DC

## 2016-05-27 MED ORDER — MONTELUKAST SODIUM 10 MG PO TABS: 10.0000 mg | ORAL_TABLET | Freq: Every day | ORAL | 3 refills | Status: DC

## 2016-05-27 MED ORDER — GABAPENTIN 300 MG PO CAPS: 300.0000 mg | ORAL_CAPSULE | Freq: Three times a day (TID) | ORAL | 0 refills | Status: DC

## 2016-05-27 MED ORDER — METOPROLOL TARTRATE 25 MG PO TABS: 25.0000 mg | ORAL_TABLET | Freq: Two times a day (BID) | ORAL | 5 refills | Status: DC

## 2016-05-27 MED ORDER — HYDROCORTISONE 1 % EX LOTN: 1.0000 "application " | TOPICAL_LOTION | Freq: Two times a day (BID) | CUTANEOUS | 0 refills | Status: DC

## 2016-05-27 MED FILL — GABAPENTIN 300 MG CAPSULE: 300 | 30 days supply | Qty: 90 | Fill #0

## 2016-05-27 MED FILL — METOPROLOL TARTRATE 25 MG T: 25 | 30 days supply | Qty: 60 | Fill #0

## 2016-05-27 MED FILL — MONTELUKAST SOD 10 MG TAB: 10 | 30 days supply | Qty: 30 | Fill #0

## 2016-05-27 MED FILL — ?CETIRIZINE HCL 10 MG TABLE: 10 | 30 days supply | Qty: 30 | Fill #0

## 2016-05-27 MED FILL — ?AMLODIPINE BESYLATE 10 MG: 10 | 30 days supply | Qty: 30 | Fill #0

## 2016-05-27 NOTE — Progress Notes (Signed)
Subjective:    Patient ID: Deborah Knight, female    DOB: 04/08/1965, 51 y.o.   MRN: [id number] Ms. [name], a 51 year old female presents for a follow-up of hypertension,  hyperlipidemia, and asthma.  She is not exercising and is adherent to low salt diet. Patient does not check blood pressures at home.  Patient denies chest pain, chest pressure/discomfort, near-syncope, orthopnea, palpitations, syncope and tachypnea.  Patient presents with hyperlipidemia. Her last labs showed Total cholesterol of 246, HDL 58, LDL 166 ,  Triglycerides 110. There is a family history of hyperlipidemia. There is not a family history of early ischemia heart disease.  Hyperlipidemia  This is a chronic problem. The current episode started more than 1 year ago. The problem is uncontrolled. Recent lipid tests were reviewed and are high. Exacerbating diseases include obesity. She has no history of chronic renal disease. Factors aggravating her hyperlipidemia include fatty foods. Pertinent negatives include no focal sensory loss, focal weakness, leg pain, myalgias or shortness of breath. Current antihyperlipidemic treatment includes statins. Compliance problems include adherence to exercise and adherence to diet.  Risk factors for coronary artery disease include dyslipidemia, hypertension and obesity.  Hypertension  This is a chronic problem. The current episode started more than 1 year ago. The problem is unchanged. The problem is uncontrolled. Pertinent negatives include no anxiety, blurred vision, headaches, peripheral edema, PND, shortness of breath or sweats. Risk factors for coronary artery disease include diabetes mellitus and obesity. Past treatments include beta blockers and diuretics. There is no history of angina, kidney disease, CAD/MI, CVA, heart failure, left ventricular hypertrophy, PVD or retinopathy. Identifiable causes of hypertension include a thyroid problem. There is no history of chronic renal  disease, coarctation of the aorta, hyperaldosteronism, hypercortisolism, hyperparathyroidism, renovascular disease or sleep apnea.  Rash  This is a chronic (Patient has a history of dermatitis and angioedema. ) problem. The current episode started more than 1 year ago. The rash is diffuse. The rash is characterized by bruising, itchiness and redness. Pertinent negatives include no shortness of breath. Past treatments include topical steroids. The treatment provided mild relief. Her past medical history is significant for allergies and asthma.     Past Medical History:  Diagnosis Date  . Anxiety   . Asthma   . HLD (hyperlipidemia)   . Hypertension   . Seasonal allergies     Immunization History  Administered Date(s) Administered  . DTP 08/03/2004  . Influenza Split 04/09/2012  . Influenza Whole 02/09/2004, 02/03/2007, 02/22/2008, 06/12/2009, 01/23/2010  . Influenza,inj,Quad PF,36+ Mos 12/14/2012, 11/17/2013, 01/31/2015, 11/22/2015  . Pneumococcal Polysaccharide-23 01/11/2004, 06/12/2009  . Td 06/29/2009   Social History   Social History  . Marital status: Single    Spouse name: N/A  . Number of children: N/A  . Years of education: N/A   Occupational History  . Not on file.   Social History Main Topics  . Smoking status: Former Smoker    Types: Cigars    Quit date: 11/09/2015  . Smokeless tobacco: Current User     Comment: vapor   . Alcohol use No  . Drug use: No  . Sexual activity: Not Currently    Birth control/ protection: Condom, None   Other Topics Concern  . Not on file   Social History Narrative   Employed as a Education officer, environmentalchildcare worker.   Review of Systems  Constitutional: Negative for unexpected weight change.  HENT: Negative.  Negative for postnasal drip.   Eyes: Negative.  Negative for blurred vision and visual disturbance.  Respiratory: Negative.  Negative for shortness of breath.   Cardiovascular: Negative.  Negative for PND.  Gastrointestinal: Negative.   Negative for constipation.  Endocrine: Negative.  Negative for polydipsia, polyphagia and polyuria.  Genitourinary: Negative.   Musculoskeletal: Positive for back pain. Negative for myalgias.  Skin: Positive for rash.  Neurological: Positive for weakness and numbness (Left upper extremity). Negative for dizziness, focal weakness, light-headedness and headaches.  Hematological: Negative.   Psychiatric/Behavioral: Positive for sleep disturbance (Occasional insomnia). Negative for suicidal ideas.       Objective:   Physical Exam  Constitutional: She is oriented to person, place, and time. She appears well-developed and well-nourished.  HENT:  Head: Normocephalic and atraumatic.  Right Ear: Hearing, tympanic membrane, external ear and ear canal normal.  Left Ear: Hearing, tympanic membrane, external ear and ear canal normal.  Mouth/Throat: Oropharynx is clear and moist.  Eyes: Conjunctivae and EOM are normal. Pupils are equal, round, and reactive to light.  Neck: Normal range of motion. Neck supple.  Cardiovascular: Normal rate, regular rhythm, normal heart sounds and intact distal pulses.   Pulmonary/Chest: Effort normal and breath sounds normal.  Abdominal: Soft. Bowel sounds are normal.  Musculoskeletal: Normal range of motion.  Neurological: She is alert and oriented to person, place, and time. She has normal reflexes.  Skin: Skin is warm and dry.  Psychiatric: She has a normal mood and affect. Her behavior is normal. Judgment and thought content normal.      BP (!) 147/90 (BP Location: Right Arm, Patient Position: Sitting, Cuff Size: Normal)   Temp 98.2 F (36.8 C) (Oral)   Resp 16   Ht 5\' 2"  (1.575 m)   Wt 236 lb (107 kg)   LMP 08/13/2012   SpO2 100%   BMI 43.16 kg/m  Assessment & Plan:  1. Prediabetes Recommend a lowfat, low carbohydrate diet divided over 5-6 small meals, increase water intake to 6-8 glasses, and 150 minutes per week of cardiovascular exercise.   - HgB  A1c  2. Essential hypertension The patient is asked to make an attempt to improve diet and exercise patterns to aid in medical management of this problem. - POCT urinalysis dip (device) - COMPLETE METABOLIC PANEL WITH GFR - metoprolol tartrate (LOPRESSOR) 25 MG tablet; Take 1 tablet (25 mg total) by mouth 2 (two) times daily.  Dispense: 60 tablet; Refill: 5 - amLODipine (NORVASC) 10 MG tablet; Take 1 tablet (10 mg total) by mouth daily.  Dispense: 30 tablet; Refill: 5  3. Chronic seasonal allergic rhinitis, unspecified trigger - cetirizine (ZYRTEC) 10 MG tablet; Take 1 tablet (10 mg total) by mouth daily.  Dispense: 30 tablet; Refill: 11  4. Mild intermittent asthma without complication - montelukast (SINGULAIR) 10 MG tablet; Take 1 tablet (10 mg total) by mouth at bedtime.  Dispense: 90 tablet; Refill: 3  5. Other hyperlipidemia - Lipid Panel  6. Dermatitis - hydrocortisone 1 % lotion; Apply 1 application topically 2 (two) times daily.  Dispense: 118 mL; Refill: 0  7. Neuropathy (HCC) - HgB A1c - gabapentin (NEURONTIN) 300 MG capsule; Take 1 capsule (300 mg total) by mouth 3 (three) times daily.  Dispense: 90 capsule; Refill: 0   8. Tobacco abuse Smoking cessation instruction/counseling given:  counseled patient on the dangers of tobacco use, advised patient to stop smoking, and reviewed strategies to maximize success      RTC: 6 months for hypertension   Bergen Melle Rennis Petty OCN, MSN, FNP-C

## 2016-05-31 ENCOUNTER — Telehealth: Payer: Self-pay | Admitting: Hematology

## 2016-05-31 ENCOUNTER — Other Ambulatory Visit: Payer: Self-pay | Admitting: Family Medicine

## 2016-05-31 DIAGNOSIS — I10 Essential (primary) hypertension: Secondary | ICD-10-CM

## 2016-05-31 DIAGNOSIS — Z889 Allergy status to unspecified drugs, medicaments and biological substances status: Secondary | ICD-10-CM

## 2016-05-31 MED ORDER — HYDROCHLOROTHIAZIDE 25 MG PO TABS: 25.0000 mg | ORAL_TABLET | Freq: Every day | ORAL | 5 refills | Status: DC

## 2016-05-31 MED ORDER — EPINEPHRINE 0.3 MG/0.3ML IJ SOAJ: 0.3000 mg | Freq: Every day | INTRAMUSCULAR | 1 refills | Status: DC | PRN

## 2016-05-31 NOTE — Telephone Encounter (Signed)
Patient is calling in regards to prescriptions.  Patient went to pharmacy and some medication were not available. / Patient needs her EPI-Pen called in  / Patient also had a question about if she is still supposed to be on hydrochlorothiazide because it was not called in either. / Lastly, patient is asking about a cream for eczema that is mixed in a jar.  What was called in was regular hydrocoritsone 1% which the pharmacy told her is OTC.  Patient is wanting something stronger to help with eczema. /

## 2016-06-02 ENCOUNTER — Other Ambulatory Visit: Payer: Self-pay | Admitting: Family Medicine

## 2016-06-02 DIAGNOSIS — L209 Atopic dermatitis, unspecified: Secondary | ICD-10-CM

## 2016-06-02 MED ORDER — FLUOCINOLONE ACETONIDE 0.01 % EX SOLN: Freq: Two times a day (BID) | CUTANEOUS | 2 refills | Status: DC

## 2016-06-03 ENCOUNTER — Ambulatory Visit: Payer: Self-pay

## 2016-06-03 MED FILL — FLUOCINOLONE 0.01% SOLUTION: 0.01 | 13 days supply | Qty: 60 | Fill #0

## 2016-06-03 NOTE — Progress Notes (Signed)
Called and left message advising patient that rx's she requested have been sent into pharmacy. Thanks!

## 2016-06-06 ENCOUNTER — Ambulatory Visit: Payer: Self-pay

## 2016-06-26 ENCOUNTER — Encounter: Payer: Self-pay | Admitting: Family Medicine

## 2016-06-26 ENCOUNTER — Ambulatory Visit (INDEPENDENT_AMBULATORY_CARE_PROVIDER_SITE_OTHER): Payer: Self-pay | Admitting: Family Medicine

## 2016-06-26 ENCOUNTER — Other Ambulatory Visit (HOSPITAL_COMMUNITY)
Admission: RE | Admit: 2016-06-26 | Discharge: 2016-06-26 | Disposition: A | Discharge status: Home / Self Care | Payer: Self-pay | Source: Ambulatory Visit | Attending: Family Medicine | Admitting: Family Medicine

## 2016-06-26 VITALS — BP 137/81 | HR 68 | Temp 98.2°F | Resp 16 | Ht 62.0 in | Wt 236.0 lb

## 2016-06-26 DIAGNOSIS — Z01419 Encounter for gynecological examination (general) (routine) without abnormal findings: Secondary | ICD-10-CM | POA: Insufficient documentation

## 2016-06-26 DIAGNOSIS — Z1231 Encounter for screening mammogram for malignant neoplasm of breast: Secondary | ICD-10-CM

## 2016-06-26 DIAGNOSIS — Z202 Contact with and (suspected) exposure to infections with a predominantly sexual mode of transmission: Secondary | ICD-10-CM

## 2016-06-26 DIAGNOSIS — B9789 Other viral agents as the cause of diseases classified elsewhere: Secondary | ICD-10-CM | POA: Insufficient documentation

## 2016-06-26 DIAGNOSIS — A5901 Trichomonal vulvovaginitis: Secondary | ICD-10-CM | POA: Insufficient documentation

## 2016-06-26 DIAGNOSIS — Z1239 Encounter for other screening for malignant neoplasm of breast: Secondary | ICD-10-CM

## 2016-06-26 LAB — POCT URINALYSIS DIP (DEVICE)
Bilirubin Urine: NEGATIVE
GLUCOSE, UA: NEGATIVE mg/dL
HGB URINE DIPSTICK: NEGATIVE
KETONES UR: NEGATIVE mg/dL
Leukocytes, UA: NEGATIVE
Nitrite: NEGATIVE
PROTEIN: NEGATIVE mg/dL
SPECIFIC GRAVITY, URINE: 1.02 (ref 1.005–1.030)
Urobilinogen, UA: 0.2 mg/dL (ref 0.0–1.0)
pH: 7 (ref 5.0–8.0)

## 2016-06-26 NOTE — Progress Notes (Signed)
Subjective:    Patient ID: KELSEY DURFLINGER, female    DOB: 1965/10/17, 51 y.o.   MRN: [id number]  Gynecologic Exam  The patient's pertinent negatives include no genital itching, genital lesions, genital odor, genital rash, missed menses, pelvic pain, vaginal bleeding or vaginal discharge. This is a new problem. She is not pregnant. Pertinent negatives include no abdominal pain, anorexia, back pain, chills, constipation, diarrhea, discolored urine, dysuria, fever, flank pain, frequency, headaches, hematuria, joint pain, joint swelling, nausea, painful intercourse, rash, sore throat, urgency or vomiting. She is sexually active. No, her partner does not have an STD. She uses nothing for contraception. She is postmenopausal. Her past medical history is significant for a Cesarean section.   Past Medical History:  Diagnosis Date  . Anxiety   . Asthma   . HLD (hyperlipidemia)   . Hypertension   . Seasonal allergies    Social History   Social History  . Marital status: Single    Spouse name: N/A  . Number of children: N/A  . Years of education: N/A   Occupational History  . Not on file.   Social History Main Topics  . Smoking status: Former Smoker    Types: Cigars    Quit date: 11/09/2015  . Smokeless tobacco: Current User     Comment: vapor   . Alcohol use No  . Drug use: No  . Sexual activity: Not Currently    Birth control/ protection: Condom, None   Other Topics Concern  . Not on file   Social History Narrative   Employed as a Education officer, environmental.   Immunization History  Administered Date(s) Administered  . DTP 08/03/2004  . Influenza Split 04/09/2012  . Influenza Whole 02/09/2004, 02/03/2007, 02/22/2008, 06/12/2009, 01/23/2010  . Influenza,inj,Quad PF,36+ Mos 12/14/2012, 11/17/2013, 01/31/2015, 11/22/2015  . Pneumococcal Polysaccharide-23 01/11/2004, 06/12/2009  . Td 06/29/2009   Allergies  Allergen Reactions  . Losartan Swelling    Lip swelling  .  Shellfish-Derived Products     Other reaction(s): Angioedema (ALLERGY/intolerance)  . Toradol [Ketorolac Tromethamine] Swelling    Lip swelling  . Latex Rash      Review of Systems  Constitutional: Negative.  Negative for chills and fever.  HENT: Negative.  Negative for sore throat.   Eyes: Negative.   Respiratory: Negative.   Gastrointestinal: Negative for abdominal pain, anorexia, constipation, diarrhea, nausea and vomiting.  Endocrine: Negative.   Genitourinary: Negative.  Negative for dyspareunia, dysuria, enuresis, flank pain, frequency, genital sores, hematuria, missed menses, pelvic pain, urgency and vaginal discharge.  Musculoskeletal: Negative for back pain and joint pain.  Skin: Negative for rash.  Neurological: Negative.  Negative for headaches.  Hematological: Negative.   Psychiatric/Behavioral: Negative.        Objective:   Physical Exam  Constitutional: She is oriented to person, place, and time.  HENT:  Head: Normocephalic and atraumatic.  Right Ear: External ear normal.  Left Ear: External ear normal.  Nose: Nose normal.  Mouth/Throat: Oropharynx is clear and moist.  Eyes: Conjunctivae are normal. Pupils are equal, round, and reactive to light.  Neck: Normal range of motion. Neck supple.  Pulmonary/Chest: Effort normal and breath sounds normal.  Abdominal: Soft. Bowel sounds are normal.  Genitourinary: There is no tenderness or lesion on the right labia. There is no tenderness or lesion on the left labia. Uterus is not deviated, not enlarged, not fixed and not tender. Cervix exhibits discharge. Cervix exhibits no motion tenderness and no friability. Vaginal discharge found.  Musculoskeletal:  Normal range of motion.  Neurological: She is alert and oriented to person, place, and time. She has normal reflexes.  Skin: Skin is warm and dry.  Psychiatric: She has a normal mood and affect. Her behavior is normal. Judgment and thought content normal.      BP  137/81 (BP Location: Right Arm, Patient Position: Sitting, Cuff Size: Normal)   Pulse 68   Temp 98.2 F (36.8 C) (Oral)   Resp 16   Ht  (1.575 m)   Wt 236 lb (107 kg)   LMP 08/13/2012   SpO2 100%   BMI 43.16 kg/m   Assessment & Plan:  1. Pap smear, as part of routine gynecological examination - Cytology - PAP [place]  2. Possible exposure to STD Recommend barrier protection with sexual intercourse - HIV antibody (with reflex) - RPR  3. Breast cancer screening Recommend monthly self breast examinations.  - MM Digital Screening; Future   RTC: 3 months for chronic conditions   Dhani Imel Rennis Petty  MSN, FNP-C [facility] [address] [place], [place] [address] [phone]

## 2016-06-26 NOTE — Patient Instructions (Addendum)
Will notify by phone with pap smear results.  Cancer Screening for Women A cancer screening is a test or exam that checks for cancer. Your health care provider will recommend specific cancer screenings based on your age, personal history, and family history of cancer. Work with your health care provider to create a cancer screening schedule that protects your health. Why is cancer screening done? Cancer screening is done to look for cancer in the very early stages, before it spreads and becomes harder to treat and before you would start to notice symptoms. Finding cancer early improves the chances of successful treatment. It may save your life. Who should be screened for cancer? All women should be screened for certain cancers, including breast cancer, cervical cancer, and skin cancer. Your health care provider may recommend screenings for other types of cancer if:  You had cancer before.  You have a family member with cancer.  You have abnormal genes that could increase the risk of cancer.  You have risk factors for certain cancers, such as smoking. When you should be screened for cancer depends on:  Your age.  Your medical history and your family's medical history.  Certain lifestyle factors, such as smoking.  Environmental exposure, such as to asbestos. What are some common cancer screenings? Breast cancer  Breast cancer screening is done with a test that takes images of breast tissue (mammogram). Here are some screening guidelines:  When you are age 65-44. you will be given the choice to start having mammograms.  When you are age 27-54, you should have a mammogram every year.  You may start having mammograms before age 24 if you have risk factors for breast cancer, such as having an immediate family member with breast cancer.  When you are age 49 or older, you should have a mammogram every 1-2 years for as long as you are in good health and have a life expectancy of 10 years or  more.  It is important to know what your breasts look and feel like so you can report any changes to your health care provider. Cervical cancer  Cervical cancer screening is done with a Pap test. This testchecks for abnormalities, including the virus that causes cervical cancer (human papillomavirus, or HPV). To perform the test, a health care provider takes a swab of cervical cells during a pelvic exam. Screening for cervical cancer with a Pap test should start at age 39. Here are some screening guidelines:  When you are age 71-29, you should have a Pap test every 3 years.  When you are age 45-65, you should have a Pap test and HPV test every 5 years or have a Pap test every 3 years.  You may be screened for cervical cancer more often if you have risk factors for cervical cancer.  If your Pap tests are abnormal, you may have an HPV test.  If you have had the HPV vaccine, you will still be screened for cervical cancer and follow normal screening recommendations. You do not need to be screened for cervical cancer if any of the following apply to you:  You are older than age 54 and you have not had a serious cervical precancer or cancer in the last 20 years.  Your cervix and uterus have been removed and you have never had cervical cancer or precancerous cells. Endometrial cancer  There is no standard screening test for endometrial cancer, but the cancer can be detected with:  A test of a sample  of tissue taken from the lining of the uterus (endometrial tissue biopsy).  A vaginal ultrasound.  Pap tests. If you are at increased risk for endometrial cancer, you may need to have these tests more often than normal. You are at increased risk if:  You have a family history of ovarian, uterine, or colon cancer.  You are taking tamoxifen, a drug that is used to treat breast cancer.  You have certain types of colon cancer. If you have reached menopause, it is especially important to talk with  your health care provider about any vaginal bleeding or spotting. Screening for endometrial cancer is not recommended for women who do not have symptoms of the cancer, such as vaginal bleeding. Colorectal cancer  Screening for colorectal cancer is recommended starting at age 34 for most women. If you have a family history of colon or rectal cancer or other risk factors, you may need to start having screenings earlier. Talk with your health care provider about which screening test is right for you and how often you should be screened. Colorectal cancer screening looks for cancer or for growths called polyps that often form before cancer starts. Tests to look for cancer or polyps include:  Colonoscopy or flexible sigmoidoscopy. For these procedures, a flexible tube with a small camera is inserted into the rectum.  CT colonography. This test uses X-rays and a contrast dye to check the colon for polyps. If a polyp is found, you may need to have a colonoscopy so the polyp can be located and removed. Tests to look for cancer in the stool (feces) include:  Guaiac-based fecal occult blood test (FOBT). This test detects blood in stool. It can be done at home with a kit.  Fecal immunochemical test (FIT). This test detects blood in stool. For this test, you will need to collect stool samples at home.  Stool DNA test. This test looks for blood in stool and any changes in DNA that can lead to colon cancer. For this test, you will need to collect a stool sample at home and send it to a lab. Skin cancer  Skin cancer screening is done by checking the skin for unusual moles or spots and any changes in existing moles. Your health care provider should check your skin for signs of skin cancer at every physical exam. You should check your skin every month and tell your health care provider right away if anything looks unusual. Women with a higher-than-normal risk for skin cancer may want to see a skin specialist  (dermatologist) for an annual body check. Lung cancer  Lung cancer screening is done with a CT scan that looks for abnormal cells in the lungs. Discuss lung cancer screening with your health care provider if you are 107-55 years old and if any of the following apply to you:  You currently smoke.  You used to smoke heavily.  You have had at least a 30-pack-year smoking history.  You have quit smoking within the past 15 years. If you smoke heavily or if you used to smoke, you may need to be screened every year. Where to find more information:  [place]: SkinPromotion.no  Centers for Disease Control and Prevention: http://knight-sullivan.biz/  Department of Health and Human Services: BankingDetective.si Contact a health care provider if:  You have concerns about any signs or symptoms of cancer, such as:  Moles that have an unusual shape or color.  Changes in existing moles.  A sore on your skin that  does not heal.  Blood in your stool.  Fatigue that does not go away.  Frequent pain or cramping in your abdomen.  Coughing, or coughing up blood.  Losing weight without trying.  Lumps or other changes in your breasts.  Vaginal bleeding, spotting, or changes in your periods. Summary  Be aware of and watch for signs and symptoms of cancer, especially symptoms of breast cancer, cervical cancer, endometrial cancer, colorectal cancer, skin cancer, and lung cancer.  Early detection of cancer with cancer screening may save your life.  Talk with your health care provider about your specific cancer risks.  Work together with your health care provider to create a cancer screening plan that is right for you. This information is not intended to replace advice given to you by your health care provider. Make sure you discuss any questions you have with  your health care provider. Document Released: 11/23/2015 Document Revised: 11/23/2015 Document Reviewed: 11/23/2015 Elsevier Interactive Patient Education  2017 [place] Maintenance, Female Adopting a healthy lifestyle and getting preventive care can go a long way to promote health and wellness. Talk with your health care provider about what schedule of regular examinations is right for you. This is a good chance for you to check in with your provider about disease prevention and staying healthy. In between checkups, there are plenty of things you can do on your own. Experts have done a lot of research about which lifestyle changes and preventive measures are most likely to keep you healthy. Ask your health care provider for more information. Weight and diet Eat a healthy diet  Be sure to include plenty of vegetables, fruits, low-fat dairy products, and lean protein.  Do not eat a lot of foods high in solid fats, added sugars, or salt.  Get regular exercise. This is one of the most important things you can do for your health.  Most adults should exercise for at least 150 minutes each week. The exercise should increase your heart rate and make you sweat (moderate-intensity exercise).  Most adults should also do strengthening exercises at least twice a week. This is in addition to the moderate-intensity exercise. Maintain a healthy weight  Body mass index (BMI) is a measurement that can be used to identify possible weight problems. It estimates body fat based on height and weight. Your health care provider can help determine your BMI and help you achieve or maintain a healthy weight.  For females 86 years of age and older:  A BMI below 18.5 is considered underweight.  A BMI of 18.5 to 24.9 is normal.  A BMI of 25 to 29.9 is considered overweight.  A BMI of 30 and above is considered obese. Watch levels of cholesterol and blood lipids  You should start having your blood  tested for lipids and cholesterol at 52 years of age, then have this test every 5 years.  You may need to have your cholesterol levels checked more often if:  Your lipid or cholesterol levels are high.  You are older than 52 years of age.  You are at high risk for heart disease. Cancer screening Lung Cancer  Lung cancer screening is recommended for adults 61-34 years old who are at high risk for lung cancer because of a history of smoking.  A yearly low-dose CT scan of the lungs is recommended for people who:  Currently smoke.  Have quit within the past 15 years.  Have at least a 30-pack-year history of smoking. A  pack year is smoking an average of one pack of cigarettes a day for 1 year.  Yearly screening should continue until it has been 15 years since you quit.  Yearly screening should stop if you develop a health problem that would prevent you from having lung cancer treatment. Breast Cancer  Practice breast self-awareness. This means understanding how your breasts normally appear and feel.  It also means doing regular breast self-exams. Let your health care provider know about any changes, no matter how small.  If you are in your 20s or 30s, you should have a clinical breast exam (CBE) by a health care provider every 1-3 years as part of a regular health exam.  If you are 100 or older, have a CBE every year. Also consider having a breast X-ray (mammogram) every year.  If you have a family history of breast cancer, talk to your health care provider about genetic screening.  If you are at high risk for breast cancer, talk to your health care provider about having an MRI and a mammogram every year.  Breast cancer gene (BRCA) assessment is recommended for women who have family members with BRCA-related cancers. BRCA-related cancers include:  Breast.  Ovarian.  Tubal.  Peritoneal cancers.  Results of the assessment will determine the need for genetic counseling and  BRCA1 and BRCA2 testing. Cervical Cancer  Your health care provider may recommend that you be screened regularly for cancer of the pelvic organs (ovaries, uterus, and vagina). This screening involves a pelvic examination, including checking for microscopic changes to the surface of your cervix (Pap test). You may be encouraged to have this screening done every 3 years, beginning at age 55.  For women ages 44-65, health care providers may recommend pelvic exams and Pap testing every 3 years, or they may recommend the Pap and pelvic exam, combined with testing for human papilloma virus (HPV), every 5 years. Some types of HPV increase your risk of cervical cancer. Testing for HPV may also be done on women of any age with unclear Pap test results.  Other health care providers may not recommend any screening for nonpregnant women who are considered low risk for pelvic cancer and who do not have symptoms. Ask your health care provider if a screening pelvic exam is right for you.  If you have had past treatment for cervical cancer or a condition that could lead to cancer, you need Pap tests and screening for cancer for at least 20 years after your treatment. If Pap tests have been discontinued, your risk factors (such as having a new sexual partner) need to be reassessed to determine if screening should resume. Some women have medical problems that increase the chance of getting cervical cancer. In these cases, your health care provider may recommend more frequent screening and Pap tests. Colorectal Cancer  This type of cancer can be detected and often prevented.  Routine colorectal cancer screening usually begins at 52 years of age and continues through 52 years of age.  Your health care provider may recommend screening at an earlier age if you have risk factors for colon cancer.  Your health care provider may also recommend using home test kits to check for hidden blood in the stool.  A small camera at  the end of a tube can be used to examine your colon directly (sigmoidoscopy or colonoscopy). This is done to check for the earliest forms of colorectal cancer.  Routine screening usually begins at age 40.  Direct  examination of the colon should be repeated every 5-10 years through 52 years of age. However, you may need to be screened more often if early forms of precancerous polyps or small growths are found. Skin Cancer  Check your skin from head to toe regularly.  Tell your health care provider about any new moles or changes in moles, especially if there is a change in a mole's shape or color.  Also tell your health care provider if you have a mole that is larger than the size of a pencil eraser.  Always use sunscreen. Apply sunscreen liberally and repeatedly throughout the day.  Protect yourself by wearing long sleeves, pants, a wide-brimmed hat, and sunglasses whenever you are outside. Heart disease, diabetes, and high blood pressure  High blood pressure causes heart disease and increases the risk of stroke. High blood pressure is more likely to develop in:  People who have blood pressure in the high end of the normal range (130-139/85-89 mm Hg).  People who are overweight or obese.  People who are African American.  If you are 45-24 years of age, have your blood pressure checked every 3-5 years. If you are 79 years of age or older, have your blood pressure checked every year. You should have your blood pressure measured twice-once when you are at a hospital or clinic, and once when you are not at a hospital or clinic. Record the average of the two measurements. To check your blood pressure when you are not at a hospital or clinic, you can use:  An automated blood pressure machine at a pharmacy.  A home blood pressure monitor.  If you are between 56 years and 54 years old, ask your health care provider if you should take aspirin to prevent strokes.  Have regular diabetes  screenings. This involves taking a blood sample to check your fasting blood sugar level.  If you are at a normal weight and have a low risk for diabetes, have this test once every three years after 52 years of age.  If you are overweight and have a high risk for diabetes, consider being tested at a younger age or more often. Preventing infection Hepatitis B  If you have a higher risk for hepatitis B, you should be screened for this virus. You are considered at high risk for hepatitis B if:  You were born in a country where hepatitis B is common. Ask your health care provider which countries are considered high risk.  Your parents were born in a high-risk country, and you have not been immunized against hepatitis B (hepatitis B vaccine).  You have HIV or AIDS.  You use needles to inject street drugs.  You live with someone who has hepatitis B.  You have had sex with someone who has hepatitis B.  You get hemodialysis treatment.  You take certain medicines for conditions, including cancer, organ transplantation, and autoimmune conditions. Hepatitis C  Blood testing is recommended for:  Everyone born from [birth year] through 1966.  Anyone with known risk factors for hepatitis C. Sexually transmitted infections (STIs)  You should be screened for sexually transmitted infections (STIs) including gonorrhea and chlamydia if:  You are sexually active and are younger than 52 years of age.  You are older than 52 years of age and your health care provider tells you that you are at risk for this type of infection.  Your sexual activity has changed since you were last screened and you are at an increased risk  for chlamydia or gonorrhea. Ask your health care provider if you are at risk.  If you do not have HIV, but are at risk, it may be recommended that you take a prescription medicine daily to prevent HIV infection. This is called pre-exposure prophylaxis (PrEP). You are considered at risk  if:  You are sexually active and do not regularly use condoms or know the HIV status of your partner(s).  You take drugs by injection.  You are sexually active with a partner who has HIV. Talk with your health care provider about whether you are at high risk of being infected with HIV. If you choose to begin PrEP, you should first be tested for HIV. You should then be tested every 3 months for as long as you are taking PrEP. Pregnancy  If you are premenopausal and you may become pregnant, ask your health care provider about preconception counseling.  If you may become pregnant, take 400 to 800 micrograms (mcg) of folic acid every day.  If you want to prevent pregnancy, talk to your health care provider about birth control (contraception). Osteoporosis and menopause  Osteoporosis is a disease in which the bones lose minerals and strength with aging. This can result in serious bone fractures. Your risk for osteoporosis can be identified using a bone density scan.  If you are 29 years of age or older, or if you are at risk for osteoporosis and fractures, ask your health care provider if you should be screened.  Ask your health care provider whether you should take a calcium or vitamin D supplement to lower your risk for osteoporosis.  Menopause may have certain physical symptoms and risks.  Hormone replacement therapy may reduce some of these symptoms and risks. Talk to your health care provider about whether hormone replacement therapy is right for you. Follow these instructions at home:  Schedule regular health, dental, and eye exams.  Stay current with your immunizations.  Do not use any tobacco products including cigarettes, chewing tobacco, or electronic cigarettes.  If you are pregnant, do not drink alcohol.  If you are breastfeeding, limit how much and how often you drink alcohol.  Limit alcohol intake to no more than 1 drink per day for nonpregnant women. One drink equals  12 ounces of beer, 5 ounces of wine, or 1 ounces of hard liquor.  Do not use street drugs.  Do not share needles.  Ask your health care provider for help if you need support or information about quitting drugs.  Tell your health care provider if you often feel depressed.  Tell your health care provider if you have ever been abused or do not feel safe at home. This information is not intended to replace advice given to you by your health care provider. Make sure you discuss any questions you have with your health care provider. Document Released: 09/10/2010 Document Revised: 08/03/2015 Document Reviewed: 11/29/2014 Elsevier Interactive Patient Education  2017 Elsevier

## 2016-06-27 ENCOUNTER — Other Ambulatory Visit: Payer: Self-pay | Admitting: Family Medicine

## 2016-06-27 DIAGNOSIS — N760 Acute vaginitis: Secondary | ICD-10-CM

## 2016-06-27 DIAGNOSIS — A5901 Trichomonal vulvovaginitis: Secondary | ICD-10-CM

## 2016-06-27 DIAGNOSIS — B9689 Other specified bacterial agents as the cause of diseases classified elsewhere: Secondary | ICD-10-CM

## 2016-06-27 LAB — CYTOLOGY - PAP
ADEQUACY: ABSENT
BACTERIAL VAGINITIS: POSITIVE — AB
CHLAMYDIA, DNA PROBE: NEGATIVE
Candida vaginitis: NEGATIVE
DIAGNOSIS: NEGATIVE
NEISSERIA GONORRHEA: NEGATIVE
Trichomonas: POSITIVE — AB

## 2016-06-27 LAB — HIV ANTIBODY (ROUTINE TESTING W REFLEX): HIV 1&2 Ab, 4th Generation: NONREACTIVE

## 2016-06-27 LAB — RPR

## 2016-06-27 MED ORDER — METRONIDAZOLE 500 MG PO TABS: 500.0000 mg | ORAL_TABLET | Freq: Two times a day (BID) | ORAL | 0 refills | Status: DC

## 2016-06-27 NOTE — Progress Notes (Signed)
Meds ordered this encounter  Medications  . metroNIDAZOLE (FLAGYL) 500 MG tablet    Sig: Take 1 tablet (500 mg total) by mouth 2 (two) times daily.    Dispense:  14 tablet    Refill:  0  Reviewed labs, positive for bacterial vaginitis and trichomonas. Will start Metronidazole 500 mg BID for 7 days. Recommend barrier protection with sexual intercourse.    Nolon Nations  MSN, FNP-C [facility] [address] [place], [place] [address] [phone]

## 2016-06-28 ENCOUNTER — Other Ambulatory Visit: Payer: Self-pay | Admitting: Family Medicine

## 2016-06-28 ENCOUNTER — Telehealth: Payer: Self-pay | Admitting: Family Medicine

## 2016-06-28 DIAGNOSIS — Z1231 Encounter for screening mammogram for malignant neoplasm of breast: Secondary | ICD-10-CM

## 2016-06-28 MED FILL — AMLODIPINE BESYLATE 10 MG T: 10 | 30 days supply | Qty: 30 | Fill #0

## 2016-06-28 MED FILL — HYDROCHLOROTHIAZIDE 25 MG T: 25 | 30 days supply | Qty: 30 | Fill #0

## 2016-06-28 MED FILL — ?METRONIDAZOLE 500 MG TABLE: 500 | 7 days supply | Qty: 14 | Fill #0

## 2016-06-28 NOTE — Progress Notes (Signed)
LM to CB

## 2016-06-28 NOTE — Telephone Encounter (Signed)
Patient calling back about STD results, provided results and information as directed by [place]. Patient understands.

## 2016-06-28 NOTE — Progress Notes (Signed)
Reported to patient. She verbalizes understanding.

## 2016-08-21 MED FILL — HYDROCHLOROTHIAZIDE 25 MG T: 25 | 30 days supply | Qty: 30 | Fill #1

## 2016-08-21 MED FILL — AMLODIPINE BESYLATE 10 MG T: 10 | 30 days supply | Qty: 30 | Fill #1

## 2016-08-29 ENCOUNTER — Ambulatory Visit: Payer: Self-pay | Admitting: Family Medicine

## 2016-09-03 ENCOUNTER — Ambulatory Visit: Payer: Self-pay | Attending: Internal Medicine

## 2016-09-27 MED FILL — ?AMLODIPINE BESYLATE 10 MG: 10 | 30 days supply | Qty: 30 | Fill #2

## 2016-09-27 MED FILL — HYDROCHLOROTHIAZIDE 25 MG T: 25 | 30 days supply | Qty: 30 | Fill #2

## 2016-11-26 ENCOUNTER — Other Ambulatory Visit: Payer: Self-pay | Admitting: Family Medicine

## 2016-11-26 DIAGNOSIS — I10 Essential (primary) hypertension: Secondary | ICD-10-CM

## 2016-11-26 MED FILL — AMLODIPINE BESYLATE 10 MG T: 10 | 30 days supply | Qty: 30 | Fill #0

## 2016-11-26 MED FILL — HYDROCHLOROTHIAZIDE 25 MG T: 25 | 30 days supply | Qty: 30 | Fill #3

## 2017-01-14 MED FILL — AMLODIPINE BESYLATE 10 MG T: 10 | 30 days supply | Qty: 30 | Fill #1

## 2017-01-14 MED FILL — HYDROCHLOROTHIAZIDE 25 MG T: 25 | 30 days supply | Qty: 30 | Fill #4

## 2017-03-14 MED FILL — HYDROCHLOROTHIAZIDE 25 MG T: 25 | 30 days supply | Qty: 30 | Fill #5

## 2017-03-14 MED FILL — AMLODIPINE BESYLATE 10 MG T: 10 | 30 days supply | Qty: 30 | Fill #2

## 2017-05-09 ENCOUNTER — Telehealth: Payer: Self-pay

## 2017-05-09 NOTE — Telephone Encounter (Signed)
Patient was here last 4/18 for pap, need OV for refills thanks

## 2017-05-25 ENCOUNTER — Other Ambulatory Visit: Payer: Self-pay

## 2017-05-25 ENCOUNTER — Emergency Department (HOSPITAL_COMMUNITY)
Admission: EM | Admit: 2017-05-25 | Discharge: 2017-05-25 | Disposition: A | Discharge status: Home / Self Care | Payer: Self-pay | Attending: Emergency Medicine | Admitting: Emergency Medicine

## 2017-05-25 ENCOUNTER — Encounter (HOSPITAL_COMMUNITY): Payer: Self-pay | Admitting: Emergency Medicine

## 2017-05-25 ENCOUNTER — Emergency Department (HOSPITAL_COMMUNITY): Payer: Self-pay

## 2017-05-25 DIAGNOSIS — K219 Gastro-esophageal reflux disease without esophagitis: Secondary | ICD-10-CM

## 2017-05-25 DIAGNOSIS — R0789 Other chest pain: Secondary | ICD-10-CM | POA: Insufficient documentation

## 2017-05-25 DIAGNOSIS — Z79899 Other long term (current) drug therapy: Secondary | ICD-10-CM | POA: Insufficient documentation

## 2017-05-25 DIAGNOSIS — J45909 Unspecified asthma, uncomplicated: Secondary | ICD-10-CM | POA: Insufficient documentation

## 2017-05-25 DIAGNOSIS — F329 Major depressive disorder, single episode, unspecified: Secondary | ICD-10-CM | POA: Insufficient documentation

## 2017-05-25 DIAGNOSIS — I10 Essential (primary) hypertension: Secondary | ICD-10-CM | POA: Insufficient documentation

## 2017-05-25 DIAGNOSIS — Z87891 Personal history of nicotine dependence: Secondary | ICD-10-CM | POA: Insufficient documentation

## 2017-05-25 LAB — HEPATIC FUNCTION PANEL
ALT: 26 U/L (ref 14–54)
AST: 20 U/L (ref 15–41)
Albumin: 3.9 g/dL (ref 3.5–5.0)
Alkaline Phosphatase: 74 U/L (ref 38–126)
Total Bilirubin: 0.5 mg/dL (ref 0.3–1.2)
Total Protein: 7.6 g/dL (ref 6.5–8.1)

## 2017-05-25 LAB — BASIC METABOLIC PANEL
ANION GAP: 9 (ref 5–15)
BUN: 14 mg/dL (ref 6–20)
CALCIUM: 9.5 mg/dL (ref 8.9–10.3)
CO2: 25 mmol/L (ref 22–32)
Chloride: 107 mmol/L (ref 101–111)
Creatinine, Ser: 0.55 mg/dL (ref 0.44–1.00)
GFR calc Af Amer: >60 mL/min (ref >60)
Glucose, Bld: 90 mg/dL (ref 65–99)
POTASSIUM: 3.2 mmol/L — AB (ref 3.5–5.1)
Sodium: 141 mmol/L (ref 135–145)

## 2017-05-25 LAB — I-STAT BETA HCG BLOOD, ED (MC, WL, AP ONLY): I-stat hCG, quantitative: <5 m[IU]/mL (ref <5)

## 2017-05-25 LAB — CBC
HCT: 41.6 % (ref 36.0–46.0)
Hemoglobin: 14.1 g/dL (ref 12.0–15.0)
MCH: 29.6 pg (ref 26.0–34.0)
MCHC: 33.9 g/dL (ref 30.0–36.0)
MCV: 87.4 fL (ref 78.0–100.0)
Platelets: 249 10*3/uL (ref 150–400)
RBC: 4.76 MIL/uL (ref 3.87–5.11)
RDW: 15.9 % — ABNORMAL HIGH (ref 11.5–15.5)
WBC: 7.2 10*3/uL (ref 4.0–10.5)

## 2017-05-25 LAB — I-STAT TROPONIN, ED: TROPONIN I, POC: 0 ng/mL (ref 0.00–0.08)

## 2017-05-25 LAB — LIPASE, BLOOD: LIPASE: 31 U/L (ref 11–51)

## 2017-05-25 MED ORDER — POTASSIUM CHLORIDE ER 10 MEQ PO TBCR: 10.0000 meq | EXTENDED_RELEASE_TABLET | Freq: Every day | ORAL | 0 refills | Status: DC

## 2017-05-25 MED ORDER — ALUM & MAG HYDROXIDE-SIMETH 400-400-40 MG/5ML PO SUSP: 5.0000 mL | Freq: Four times a day (QID) | ORAL | 0 refills | Status: DC | PRN

## 2017-05-25 MED ORDER — GI COCKTAIL ~~LOC~~
30.0000 mL | Freq: Once | ORAL | Status: AC
  Administered 2017-05-25: 30 mL via ORAL
  Filled 2017-05-25: qty 30

## 2017-05-25 MED ORDER — OMEPRAZOLE 20 MG PO CPDR: 20.0000 mg | DELAYED_RELEASE_CAPSULE | Freq: Every day | ORAL | 3 refills | Status: DC

## 2017-05-25 NOTE — ED Provider Notes (Signed)
[facility]-EMERGENCY DEPT Provider Note   CSN: [id number] Arrival date & time: 05/25/17  0137     History   Chief Complaint Chief Complaint  Patient presents with  . Chest Pain    HPI Deborah Knight is a 52 y.o. female presenting for evaluation of chest pain.  Patient states that around 10:00 last night she developed epigastric pain.  It radiates substernally into her back.  She reports history of similar, although not severe.  At the time she was diagnosed with GERD and was taking antacids.  However, she has not been taking any recently due to improvement of symptoms.  She denies associated shortness of breath or nausea and vomiting.  She denies cardiac history or family history of cardiac problems.  Pain is intermittent, lasting for a few minutes before resolving without intervention.  It is described as a sharp burning.  Nothing makes it better or worse.  She denies recent fevers, chills, sore throat, cough, abdominal pain, urinary symptoms, abnormal bowel movements.  She has a history of high blood pressure for which she takes medications in the morning, she has not had them yet today.  She denies recent travel, surgery, trauma, immobilization.  She denies OCP use, history of cancer, history of DVT.  She states she has a history of prediabetes, but does not take medication for this.  Additionally, patient states she is a history of angioedema.  When developing the pain, she felt like she got something stuck in her throat and was concerned her throat was closing.  She took some Benadryl which improved her symptoms.  She currently denies the feeling of swelling, but states she feels like something is stuck in her throat.  She denies difficulty breathing or swallowing.  HPI  Past Medical History:  Diagnosis Date  . Anxiety   . Asthma   . HLD (hyperlipidemia)   . Hypertension   . Seasonal allergies     Patient Active Problem List   Diagnosis Date Noted  .  Prediabetes 11/17/2013  . Unspecified vitamin D deficiency 09/23/2013  . Groin strain 09/14/2013  . Other malaise and fatigue 08/23/2013  . Metabolic syndrome 08/23/2013  . Back pain 08/23/2013  . Angioedema 01/06/2013  . Sprain and strain of shoulder and upper arm 10/09/2012  . Bicipital tenosynovitis 09/04/2012  . Joint derangement, shoulder region 07/10/2012  . Closed dislocation of shoulder 07/10/2012  . Dislocation of left shoulder joint 05/22/2012  . Right facial swelling 04/07/2012  . Rash 04/07/2012  . Tobacco abuse 02/04/2012  . KNEE PAIN, RIGHT 02/07/2010  . GERD 10/10/2009  . Depression 09/13/2009  . ALLERGIC RHINITIS 06/12/2009  . Obesity 08/30/2008  . LOW BACK PAIN SYNDROME 08/30/2008  . HYPERLIPIDEMIA 11/03/2006  . Anxiety state 11/03/2006  . Essential hypertension 11/03/2006  . Asthma 11/03/2006    Past Surgical History:  Procedure Laterality Date  . CESAREAN SECTION  1997  . HERNIA REPAIR  1989  . SHOULDER ARTHROSCOPY    . TUBAL LIGATION  1997    OB History    No data available       Home Medications    Prior to Admission medications   Medication Sig Start Date End Date Taking? Authorizing Provider  acetaminophen (TYLENOL) 500 MG tablet Take 1 tablet (500 mg total) by mouth every 6 (six) hours as needed for moderate pain. 05/28/14   Massie MaroonHollis, Lachina M, FNP  albuterol (PROVENTIL HFA;VENTOLIN HFA) 108 (90 BASE) MCG/ACT inhaler Inhale 2 puffs into the  lungs every 6 (six) hours as needed for wheezing. Shortness of breath 04/08/14   [name], FNP  alum & mag hydroxide-simeth (MAALOX ADVANCED MAX ST) 400-400-40 MG/5ML suspension Take 5 mLs by mouth every 6 (six) hours as needed for indigestion. 05/25/17   Jackalyn Haith, PA-C  amLODipine (NORVASC) 10 MG tablet Take 1 tablet (10 mg total) by mouth daily. 05/27/16   [name], FNP  amLODipine (NORVASC) 10 MG tablet TAKE 1 TABLET BY MOUTH DAILY. 11/26/16   [name], FNP  cetirizine  (ZYRTEC) 10 MG tablet Take 1 tablet (10 mg total) by mouth daily. 05/27/16   [name], FNP  citalopram (CELEXA) 20 MG tablet Take 20 mg by mouth daily.    [provider]  diphenhydrAMINE (BENADRYL) 12.5 MG/5ML elixir Take 5 mLs (12.5 mg total) by mouth 4 (four) times daily as needed for itching. 11/16/13   [name], FNP  diphenhydrAMINE (BENADRYL) 25 MG tablet Take 50 mg by mouth every 6 (six) hours as needed.    [provider]  EPINEPHrine (EPIPEN) 0.3 mg/0.3 mL IJ SOAJ injection Inject 0.3 mLs (0.3 mg total) into the muscle daily as needed (allergic reation). Patient not taking: Reported on 06/26/2016 05/31/16   [name], FNP  ergocalciferol (VITAMIN D2) 50000 UNITS capsule Take 1 capsule (50,000 Units total) by mouth once a week. Patient not taking: Reported on 03/24/2016 08/24/13   [name], MD  fluocinolone (SYNALAR) 0.01 % external solution Apply topically 2 (two) times daily. 06/02/16   [name], FNP  fluticasone (FLONASE) 50 MCG/ACT nasal spray Place 2 sprays into both nostrils daily as needed (congestion). 08/23/13   [name], MD  fluticasone-salmeterol (ADVAIR HFA) 6604819220 MCG/ACT inhaler Inhale 2 puffs into the lungs 2 (two) times daily. 08/23/13   [name], MD  gabapentin (NEURONTIN) 300 MG capsule Take 1 capsule (300 mg total) by mouth 3 (three) times daily. 05/27/16   [name], FNP  hydrochlorothiazide (HYDRODIURIL) 25 MG tablet Take 1 tablet (25 mg total) by mouth daily. 05/31/16   [name], FNP  hydrocortisone 1 % lotion Apply 1 application topically 2 (two) times daily. 05/27/16   [name], FNP  ibuprofen (ADVIL,MOTRIN) 600 MG tablet Take 1 tablet (600 mg total) by mouth every 6 (six) hours as needed. 12/01/15   Barrett Henle, PA-C  methocarbamol (ROBAXIN) 500 MG tablet Take 1 tablet (500 mg total) by mouth 2 (two) times daily. 12/01/15   Barrett Henle, PA-C    metoprolol tartrate (LOPRESSOR) 25 MG tablet Take 1 tablet (25 mg total) by mouth 2 (two) times daily. 05/27/16   [name], FNP  metroNIDAZOLE (FLAGYL) 500 MG tablet Take 1 tablet (500 mg total) by mouth 2 (two) times daily. 06/27/16   [name], FNP  montelukast (SINGULAIR) 10 MG tablet Take 1 tablet (10 mg total) by mouth at bedtime. 05/27/16   [name], FNP  omeprazole (PRILOSEC) 20 MG capsule Take 1 capsule (20 mg total) by mouth daily. 05/25/17   Lennart Gladish, PA-C  potassium chloride (K-DUR) 10 MEQ tablet Take 1 tablet (10 mEq total) by mouth daily. 05/25/17   Hoda Hon, PA-C  potassium chloride SA (K-DUR,KLOR-CON) 20 MEQ tablet Take 1 tablet (20 mEq total) by mouth daily. Patient not taking: Reported on 05/27/2016 08/24/13   [name], MD  pravastatin (PRAVACHOL) 40 MG tablet Take 1 tablet (40 mg total) by  mouth daily. Patient not taking: Reported on 03/24/2016 01/31/15   [name], FNP  sertraline (ZOLOFT) 50 MG tablet TAKE 1.5 TABLET (75 MG TOTAL) BY MOUTH DAILY. Patient not taking: Reported on 03/24/2016 09/23/13   [name], MD  triamcinolone cream (KENALOG) 0.1 % Apply 1 application topically 2 (two) times daily. Patient not taking: Reported on 05/27/2016 03/24/16   Liberty Handy, PA-C    Family History Family History  Problem Relation Age of Onset  . Asthma Father   . Cancer Father   . Asthma Sister   . Asthma Brother   . Asthma Maternal Grandmother   . Asthma Maternal Grandfather   . Asthma Paternal Grandmother   . Asthma Paternal Grandfather   . Hyperlipidemia Mother   . Hyperlipidemia Sister     Social History Social History   Tobacco Use  . Smoking status: Former Smoker    Types: Cigars    Last attempt to quit: 11/09/2015    Years since quitting: 1.5  . Smokeless tobacco: Current User  . Tobacco comment: vapor   Substance Use Topics  . Alcohol use: No  . Drug use: No     Allergies    Losartan; Shellfish-derived products; Toradol [ketorolac tromethamine]; and Latex   Review of Systems Review of Systems  Cardiovascular: Positive for chest pain.  Gastrointestinal: Positive for abdominal pain.  All other systems reviewed and are negative.    Physical Exam Updated Vital Signs BP (!) 150/106 (BP Location: Left Arm)   Pulse 82   Temp 97.7 F (36.5 C) (Oral)   Resp 14   LMP 08/13/2012   SpO2 99%   Physical Exam  Constitutional: She is oriented to person, place, and time. She appears well-developed and well-nourished. No distress.  HENT:  Head: Normocephalic and atraumatic.  Mouth/Throat: Uvula is midline, oropharynx is clear and moist and mucous membranes are normal.  No obvious angioedema.  No swelling of the lips or tongue or throat.  Patient handling secretions easily and breathing without difficulty.  Eyes: Conjunctivae and EOM are normal. Pupils are equal, round, and reactive to light.  Neck: Normal range of motion. Neck supple.  Cardiovascular: Normal rate, regular rhythm and intact distal pulses.  Pulmonary/Chest: Effort normal and breath sounds normal. No respiratory distress. She has no wheezes.  Speaking full sentences without difficulty.  No sign of respiratory distress.  Clear lung sounds in all fields.  Abdominal: Soft. She exhibits no distension and no mass. There is tenderness. There is no guarding.  Tenderness to palpation of epigastric area.  No tenderness to palpation elsewhere in the abdomen.  Abdomen is soft without rigidity, guarding, or distention.  Musculoskeletal: Normal range of motion.  No leg pain or swelling.  Radial and pedal pulses equal bilaterally.  Neurological: She is alert and oriented to person, place, and time.  Skin: Skin is warm and dry.  Psychiatric: She has a normal mood and affect.  Nursing note and vitals reviewed.    ED Treatments / Results  Labs (all labs ordered are listed, but only abnormal results are  displayed) Labs Reviewed  BASIC METABOLIC PANEL - Abnormal; Notable for the following components:      Result Value   Potassium 3.2 (*)    All other components within normal limits  CBC - Abnormal; Notable for the following components:   RDW 15.9 (*)    All other components within normal limits  HEPATIC FUNCTION PANEL - Abnormal; Notable for the following components:  Bilirubin, Direct <0.1 (*)    All other components within normal limits  LIPASE, BLOOD  I-STAT TROPONIN, ED  I-STAT BETA HCG BLOOD, ED ([facility], [facility], [facility] ONLY)    EKG  EKG Interpretation  Date/Time:  Sunday May 25 2017 01:56:07 EDT Ventricular Rate:  78 PR Interval:    QRS Duration: 96 QT Interval:  433 QTC Calculation: 500 R Axis:   -74 Text Interpretation:  Sinus rhythm LAD, consider left anterior fascicular block Borderline T abnormalities, anterior leads Borderline prolonged QT interval Confirmed by DeLo, Douglas ([id number]) on 05/25/2017 3:26:10 AM Also confirmed by DeLo, Douglas ([id number]), editor Simpson, Miranda ([id number])  on 05/25/2017 11:26:51 AM       Radiology Dg Chest 2 View  Result Date: 05/25/2017 CLINICAL DATA:  Chest pain. EXAM: CHEST - 2 VIEW COMPARISON:  03/10/2012 FINDINGS: Low lung volumes.The cardiomediastinal contours are normal. Pulmonary vasculature is normal. No consolidation, pleural effusion, or pneumothorax. No acute osseous abnormalities are seen. IMPRESSION: Low lung volumes without acute abnormality. Electronically Signed   By: Melanie  Ehinger M.D.   On: 05/25/2017 05:41    Procedures Procedures (including critical care time)  Medications Ordered in ED Medications  gi cocktail (Maalox,Lidocaine,Donnatal) (30 mLs Oral Given 05/25/17 0629)     Initial Impression / Assessment and Plan / ED Course  I have reviewed the triage vital signs and the nursing notes.  Pertinent labs & imaging results that were available during my care of the patient were reviewed by me and considered in my medical  decision making (see chart for details).     Patient presenting for evaluation of chest pain.  Pain is intermittent.  Physical exam shows patient is afebrile not tachycardic.  She appears nontoxic.  No shortness of breath or signs of respiratory distress.  Initial labs reassuring, troponin negative, EKG without signs of STEMI.  Chest x-ray interpreted by me, no sign of infection or acute finding.  BMP shows mild hypokalemia.  Liver function, lipase, and CBC reassuring.    GI cocktail given, patient with mild improvement but not resolution of pain.  Patient with a heart score of 2, low risk.  At this time, doubt ACS, PE, myocarditis, endocarditis, or other emergent pathology.  Likely GERD.  History of reflux for which she is no longer taking medications.  Will restart on omeprazole and have patient follow-up with primary care.  Discussed findings with patient.  At this time, patient present for discharge.  Return precautions given.  Patient states she understands and agrees to plan.   Final Clinical Impressions(s) / ED Diagnoses   Final diagnoses:  Atypical chest pain    ED Discharge Orders        Ordered    omeprazole (PRILOSEC) 20 MG capsule  Daily     05/25/17 0804    potassium chloride (K-DUR) 10 MEQ tablet  Daily     05/25/17 0804    alum & mag hydroxide-simeth (MAALOX ADVANCED MAX ST) 400-400-40 MG/5ML suspension  Every 6 hours PRN     03 /17/19 0804       Nishawn Rotan, PA-C 05/25/17 1616    Geoffery Lyons, MD 05/26/17 [phone]

## 2017-05-25 NOTE — ED Triage Notes (Signed)
Pt comes in complaining of back and chest pain that started at 2200. Patient states that the pain starts in her back and chest and "rolls" in a wave down her chest to her umbilicus. Patient states that she has a hx of angioedema and states that she also feels like her throat is swollen. Patient states she took benedryl which made it better. Patient not complaining of any shortness of breath except for when the pain starts.

## 2017-05-30 ENCOUNTER — Ambulatory Visit: Payer: Self-pay | Admitting: Family Medicine

## 2017-05-30 ENCOUNTER — Encounter: Payer: Self-pay | Admitting: Family Medicine

## 2017-05-30 ENCOUNTER — Ambulatory Visit (INDEPENDENT_AMBULATORY_CARE_PROVIDER_SITE_OTHER): Payer: Self-pay | Admitting: Family Medicine

## 2017-05-30 ENCOUNTER — Ambulatory Visit: Payer: Self-pay

## 2017-05-30 VITALS — BP 146/98 | HR 78 | Temp 98.3°F | Resp 16 | Ht 62.0 in | Wt 226.0 lb

## 2017-05-30 DIAGNOSIS — L209 Atopic dermatitis, unspecified: Secondary | ICD-10-CM

## 2017-05-30 DIAGNOSIS — G629 Polyneuropathy, unspecified: Secondary | ICD-10-CM

## 2017-05-30 DIAGNOSIS — R7303 Prediabetes: Secondary | ICD-10-CM

## 2017-05-30 DIAGNOSIS — Z889 Allergy status to unspecified drugs, medicaments and biological substances status: Secondary | ICD-10-CM

## 2017-05-30 DIAGNOSIS — J4520 Mild intermittent asthma, uncomplicated: Secondary | ICD-10-CM

## 2017-05-30 DIAGNOSIS — Z6841 Body Mass Index (BMI) 40.0 and over, adult: Secondary | ICD-10-CM

## 2017-05-30 DIAGNOSIS — J302 Other seasonal allergic rhinitis: Secondary | ICD-10-CM

## 2017-05-30 DIAGNOSIS — I10 Essential (primary) hypertension: Secondary | ICD-10-CM

## 2017-05-30 LAB — POCT URINALYSIS DIP (DEVICE)
BILIRUBIN URINE: NEGATIVE
Glucose, UA: NEGATIVE mg/dL
HGB URINE DIPSTICK: NEGATIVE
Ketones, ur: NEGATIVE mg/dL
LEUKOCYTES UA: NEGATIVE
NITRITE: NEGATIVE
PH: 7 (ref 5.0–8.0)
Protein, ur: NEGATIVE mg/dL
Specific Gravity, Urine: 1.02 (ref 1.005–1.030)
Urobilinogen, UA: 0.2 mg/dL (ref 0.0–1.0)

## 2017-05-30 LAB — POCT GLYCOSYLATED HEMOGLOBIN (HGB A1C): Hemoglobin A1C: 5.7

## 2017-05-30 MED ORDER — AMLODIPINE BESYLATE 10 MG PO TABS: 10.0000 mg | ORAL_TABLET | Freq: Every day | ORAL | 5 refills | Status: DC

## 2017-05-30 MED ORDER — TRIAMCINOLONE ACETONIDE 0.1 % EX CREA: 1.0000 "application " | TOPICAL_CREAM | Freq: Two times a day (BID) | CUTANEOUS | 1 refills | Status: DC

## 2017-05-30 MED ORDER — CETIRIZINE HCL 10 MG PO TABS: 10.0000 mg | ORAL_TABLET | Freq: Every day | ORAL | 11 refills | Status: DC

## 2017-05-30 MED ORDER — EPINEPHRINE 0.3 MG/0.3ML IJ SOAJ: 0.3000 mg | Freq: Every day | INTRAMUSCULAR | 1 refills | Status: DC | PRN

## 2017-05-30 MED ORDER — ALBUTEROL SULFATE HFA 108 (90 BASE) MCG/ACT IN AERS: 2.0000 | INHALATION_SPRAY | Freq: Four times a day (QID) | RESPIRATORY_TRACT | 2 refills | Status: DC | PRN

## 2017-05-30 MED ORDER — FLUTICASONE-SALMETEROL 115-21 MCG/ACT IN AERO: 2.0000 | INHALATION_SPRAY | Freq: Two times a day (BID) | RESPIRATORY_TRACT | 11 refills | Status: DC

## 2017-05-30 MED ORDER — MONTELUKAST SODIUM 10 MG PO TABS: 10.0000 mg | ORAL_TABLET | Freq: Every day | ORAL | 3 refills | Status: DC

## 2017-05-30 MED ORDER — METOPROLOL TARTRATE 25 MG PO TABS: 12.5000 mg | ORAL_TABLET | Freq: Two times a day (BID) | ORAL | 5 refills | Status: DC

## 2017-05-30 MED ORDER — FLUTICASONE PROPIONATE 50 MCG/ACT NA SUSP: 2.0000 | Freq: Every day | NASAL | 3 refills | Status: DC | PRN

## 2017-05-30 MED ORDER — HYDROCHLOROTHIAZIDE 12.5 MG PO TABS
12.5000 mg | ORAL_TABLET | Freq: Every day | ORAL | 5 refills | Status: DC
Start: 2017-05-30 — End: 2018-02-09

## 2017-05-30 MED ORDER — GABAPENTIN 300 MG PO CAPS: 300.0000 mg | ORAL_CAPSULE | Freq: Three times a day (TID) | ORAL | 1 refills | Status: DC

## 2017-05-30 NOTE — Progress Notes (Signed)
Subjective:    Patient ID: Deborah Knight, female    DOB: 04/25/1965, 52 y.o.   MRN: [id number] Deborah Knight, a 52 year old female presents for a follow-up of hypertension,  hyperlipidemia, allergic rhinitis, and asthma.  She is not exercising and is adherent to low salt diet. Patient does not check blood pressures at home.  Patient denies chest pain, chest pressure/discomfort, near-syncope, orthopnea, palpitations, syncope and tachypnea.   Hyperlipidemia  This is a chronic problem. The current episode started more than 1 year ago. The problem is uncontrolled. Recent lipid tests were reviewed and are high. Exacerbating diseases include obesity. She has no history of chronic renal disease. Factors aggravating her hyperlipidemia include fatty foods. Pertinent negatives include no focal sensory loss, focal weakness, leg pain, myalgias or shortness of breath. Current antihyperlipidemic treatment includes statins. Compliance problems include adherence to exercise and adherence to diet.  Risk factors for coronary artery disease include dyslipidemia, hypertension and obesity.  Hypertension  This is a chronic problem. The current episode started more than 1 year ago. The problem is unchanged. The problem is uncontrolled. Pertinent negatives include no anxiety, blurred vision, headaches, neck pain, peripheral edema, PND, shortness of breath or sweats. Risk factors for coronary artery disease include diabetes mellitus and obesity. Past treatments include beta blockers and diuretics. There is no history of angina, kidney disease, CAD/MI, CVA, heart failure, left ventricular hypertrophy, PVD or retinopathy. Identifiable causes of hypertension include a thyroid problem. There is no history of chronic renal disease, coarctation of the aorta, hyperaldosteronism, hypercortisolism, hyperparathyroidism, renovascular disease or sleep apnea.  Rash  This is a chronic (Patient has a history of dermatitis and  angioedema. ) problem. The current episode started more than 1 year ago. The rash is diffuse. The rash is characterized by bruising, itchiness and redness. Pertinent negatives include no shortness of breath. Past treatments include topical steroids. The treatment provided mild relief. Her past medical history is significant for allergies and asthma.  Gastroesophageal Reflux  She reports no hoarse voice.  Asthma  There is no chest tightness, difficulty breathing, hoarse voice, shortness of breath or sputum production. The problem occurs intermittently. Pertinent negatives include no headaches, myalgias, PND, postnasal drip or sweats. Her symptoms are aggravated by change in weather. Her symptoms are alleviated by steroid inhaler and beta-agonist. She reports significant improvement on treatment. Her past medical history is significant for asthma.     Past Medical History:  Diagnosis Date  . Anxiety   . Asthma   . HLD (hyperlipidemia)   . Hypertension   . Seasonal allergies     Immunization History  Administered Date(s) Administered  . DTP 08/03/2004  . Influenza Split 04/09/2012  . Influenza Whole 02/09/2004, 02/03/2007, 02/22/2008, 06/12/2009, 01/23/2010  . Influenza,inj,Quad PF,6+ Mos 12/14/2012, 11/17/2013, 01/31/2015, 11/22/2015  . Pneumococcal Polysaccharide-23 01/11/2004, 06/12/2009  . Td 06/29/2009   Social History   Socioeconomic History  . Marital status: Divorced    Spouse name: Not on file  . Number of children: Not on file  . Years of education: Not on file  . Highest education level: Not on file  Occupational History  . Not on file  Social Needs  . Financial resource strain: Not on file  . Food insecurity:    Worry: Not on file    Inability: Not on file  . Transportation needs:    Medical: Not on file    Non-medical: Not on file  Tobacco Use  . Smoking status: Former  Smoker    Types: Cigars    Last attempt to quit: 11/09/2015    Years since quitting: 1.5  .  Smokeless tobacco: Current User  . Tobacco comment: vapor   Substance and Sexual Activity  . Alcohol use: No  . Drug use: No  . Sexual activity: Not Currently    Birth control/protection: Condom, None  Lifestyle  . Physical activity:    Days per week: Not on file    Minutes per session: Not on file  . Stress: Not on file  Relationships  . Social connections:    Talks on phone: Not on file    Gets together: Not on file    Attends religious service: Not on file    Active member of club or organization: Not on file    Attends meetings of clubs or organizations: Not on file    Relationship status: Not on file  . Intimate partner violence:    Fear of current or ex partner: Not on file    Emotionally abused: Not on file    Physically abused: Not on file    Forced sexual activity: Not on file  Other Topics Concern  . Not on file  Social History Narrative   Employed as a Education officer, environmental.   Review of Systems  Constitutional: Negative for unexpected weight change.  HENT: Negative.  Negative for hoarse voice and postnasal drip.   Eyes: Negative.  Negative for blurred vision and visual disturbance.  Respiratory: Negative.  Negative for sputum production, chest tightness and shortness of breath.   Cardiovascular: Negative.  Negative for PND.  Gastrointestinal: Negative.  Negative for constipation.  Endocrine: Negative.  Negative for polydipsia, polyphagia and polyuria.  Genitourinary: Negative.   Musculoskeletal: Negative for myalgias and neck pain.  Skin: Positive for rash.  Allergic/Immunologic: Positive for environmental allergies.  Neurological: Positive for weakness and numbness (Left upper extremity). Negative for dizziness, focal weakness, light-headedness and headaches.  Hematological: Negative.   Psychiatric/Behavioral: Negative for suicidal ideas.      Objective:   Physical Exam  Constitutional: She is oriented to person, place, and time. She appears well-developed and  well-nourished.  HENT:  Head: Normocephalic and atraumatic.  Right Ear: Hearing, tympanic membrane, external ear and ear canal normal.  Left Ear: Hearing, tympanic membrane, external ear and ear canal normal.  Mouth/Throat: Oropharynx is clear and moist.  Eyes: Pupils are equal, round, and reactive to light. Conjunctivae and EOM are normal.  Neck: Normal range of motion. Neck supple.  Cardiovascular: Normal rate, regular rhythm, normal heart sounds and intact distal pulses.  Pulmonary/Chest: Effort normal and breath sounds normal.  Abdominal: Soft. Bowel sounds are normal.  Musculoskeletal: Normal range of motion.  Neurological: She is alert and oriented to person, place, and time. She has normal reflexes.  Skin: Skin is warm and dry.  Psychiatric: She has a normal mood and affect. Her behavior is normal. Judgment and thought content normal.      BP (!) 146/98 (BP Location: Right Arm, Patient Position: Sitting, Cuff Size: Large) Comment: manually  Pulse 78   Temp 98.3 F (36.8 C) (Oral)   Resp 16   Ht 5\' 2"  (1.575 m)   Wt 226 lb (102.5 kg)   LMP 08/13/2012   SpO2 100%   BMI 41.34 kg/m  Assessment & Plan:  1. Essential hypertension Blood pressure is above goal. Patient has not been taking medications consistently.  Discussed the importance of taking medications consistently in order to achieve positive  outcomes.  - amLODipine (NORVASC) 10 MG tablet; Take 1 tablet (10 mg total) by mouth daily.  Dispense: 30 tablet; Refill: 5 - metoprolol tartrate (LOPRESSOR) 25 MG tablet; Take 0.5 tablets (12.5 mg total) by mouth 2 (two) times daily.  Dispense: 60 tablet; Refill: 5 - hydrochlorothiazide (HYDRODIURIL) 12.5 MG tablet; Take 1 tablet (12.5 mg total) by mouth daily.  Dispense: 30 tablet; Refill: 5 - Basic Metabolic Panel - POCT urinalysis dip (device)  2. Mild intermittent asthma without complication - montelukast (SINGULAIR) 10 MG tablet; Take 1 tablet (10 mg total) by mouth at  bedtime.  Dispense: 90 tablet; Refill: 3 - albuterol (PROVENTIL HFA;VENTOLIN HFA) 108 (90 Base) MCG/ACT inhaler; Inhale 2 puffs into the lungs every 6 (six) hours as needed for wheezing. Shortness of breath  Dispense: 1 Inhaler; Refill: 2 - fluticasone-salmeterol (ADVAIR HFA) 115-21 MCG/ACT inhaler; Inhale 2 puffs into the lungs 2 (two) times daily.  Dispense: 1 Inhaler; Refill: 11  3. Chronic seasonal allergic rhinitis - cetirizine (ZYRTEC) 10 MG tablet; Take 1 tablet (10 mg total) by mouth daily.  Dispense: 30 tablet; Refill: 11  4. Multiple allergies - EPINEPHrine (EPIPEN) 0.3 mg/0.3 mL IJ SOAJ injection; Inject 0.3 mLs (0.3 mg total) into the muscle daily as needed (allergic reation).  Dispense: 1 Device; Refill: 1  5. Neuropathy - gabapentin (NEURONTIN) 300 MG capsule; Take 1 capsule (300 mg total) by mouth 3 (three) times daily.  Dispense: 90 capsule; Refill: 1  6. Atopic dermatitis, unspecified type - triamcinolone cream (KENALOG) 0.1 %; Apply 1 application topically 2 (two) times daily.  Dispense: 453.6 g; Refill: 1  7. Class 3 severe obesity due to excess calories with serious comorbidity and body mass index (BMI) of 40.0 to 44.9 in adult [facility]) Body mass index is 41.34 kg/m. Recommend a lowfat, low carbohydrate diet divided over 5-6 small meals, increase water intake to 6-8 glasses, and 150 minutes per week of cardiovascular exercise.   - TSH  9. Prediabetes - HgB A1c   RTC: 2 weeks for a bp check. 3 months for chronic conditions  Nolon Nations  MSN, FNP-C Patient Care [facility] [address] [place], [place] [address] [phone]

## 2017-05-30 NOTE — Patient Instructions (Signed)
Deborah Knight ([facility] program) [phone] Your blood pressure is above goal. Will restart antihypertensive medications.  Continue medication, monitor blood pressure at home. Continue DASH diet. Reminder to go to the ER if any CP, SOB, nausea, dizziness, severe HA, changes vision/speech, left arm numbness and tingling and jaw pain.  Resume all prescribed medications Blood pressure check in 2 weeks.

## 2017-05-31 LAB — BASIC METABOLIC PANEL
BUN / CREAT RATIO: 19 (ref 9–23)
BUN: 10 mg/dL (ref 6–24)
CO2: 21 mmol/L (ref 20–29)
CREATININE: 0.52 mg/dL — AB (ref 0.57–1.00)
Calcium: 9 mg/dL (ref 8.7–10.2)
Chloride: 110 mmol/L — ABNORMAL HIGH (ref 96–106)
GFR calc Af Amer: 127 mL/min/{1.73_m2} (ref >59)
GFR calc non Af Amer: 110 mL/min/{1.73_m2} (ref >59)
GLUCOSE: 90 mg/dL (ref 65–99)
POTASSIUM: 3.8 mmol/L (ref 3.5–5.2)
SODIUM: 146 mmol/L — AB (ref 134–144)

## 2017-05-31 LAB — TSH: TSH: 0.627 u[IU]/mL (ref 0.450–4.500)

## 2017-06-02 ENCOUNTER — Telehealth: Payer: Self-pay

## 2017-06-02 NOTE — Telephone Encounter (Signed)
-----   Message from Massie MaroonLachina M Hollis, OregonFNP sent at 05/31/2017  6:58 AM EDT ----- Regarding: lab results Please inform patient that all labs are within a normal range, no medication changes warranted at this time. Follow up for blood pressure check as scheduled.   Nolon NationsLachina Moore Hollis  MSN, FNP-C Patient Care [facility] [address]  [place], [place] [id number] [phone]

## 2017-06-02 NOTE — Telephone Encounter (Signed)
Called and spoke with patient, advised that all labs are within a normal range and no medication changes are needed at this time. Asked that she keep follow up for bp check as scheduled. Patient verbalized understanding. Thanks!

## 2017-06-13 ENCOUNTER — Ambulatory Visit: Payer: Self-pay

## 2017-06-13 VITALS — BP 132/86

## 2017-06-13 DIAGNOSIS — I10 Essential (primary) hypertension: Secondary | ICD-10-CM

## 2017-06-13 NOTE — Progress Notes (Signed)
Patient came in for bp check, manually it is 132/86. Patient states she has taken her amlodipine today. I advised her to continue to take medication everyday and keep next regular scheduled appointment. Thanks!

## 2017-09-01 ENCOUNTER — Ambulatory Visit: Payer: Self-pay | Admitting: Family Medicine

## 2017-09-08 ENCOUNTER — Encounter: Payer: Self-pay | Admitting: Family Medicine

## 2017-09-08 ENCOUNTER — Ambulatory Visit (INDEPENDENT_AMBULATORY_CARE_PROVIDER_SITE_OTHER): Payer: Self-pay | Admitting: Family Medicine

## 2017-09-08 VITALS — BP 126/82 | HR 79 | Temp 98.4°F | Resp 16 | Ht 62.0 in | Wt 226.0 lb

## 2017-09-08 DIAGNOSIS — E785 Hyperlipidemia, unspecified: Secondary | ICD-10-CM

## 2017-09-08 DIAGNOSIS — R7303 Prediabetes: Secondary | ICD-10-CM

## 2017-09-08 DIAGNOSIS — I10 Essential (primary) hypertension: Secondary | ICD-10-CM

## 2017-09-08 DIAGNOSIS — Z23 Encounter for immunization: Secondary | ICD-10-CM

## 2017-09-08 DIAGNOSIS — Z6841 Body Mass Index (BMI) 40.0 and over, adult: Secondary | ICD-10-CM

## 2017-09-08 DIAGNOSIS — J069 Acute upper respiratory infection, unspecified: Secondary | ICD-10-CM

## 2017-09-08 LAB — POCT URINALYSIS DIPSTICK
BILIRUBIN UA: NEGATIVE
Blood, UA: NEGATIVE
GLUCOSE UA: NEGATIVE
KETONES UA: NEGATIVE
NITRITE UA: NEGATIVE
PROTEIN UA: NEGATIVE
SPEC GRAV UA: 1.02 (ref 1.010–1.025)
Urobilinogen, UA: 0.2 E.U./dL
pH, UA: 7 (ref 5.0–8.0)

## 2017-09-08 MED ORDER — AZITHROMYCIN 250 MG PO TABS: ORAL_TABLET | ORAL | 0 refills | Status: DC

## 2017-09-08 NOTE — Progress Notes (Signed)
Subjective:    Patient ID: Deborah Knight, female    DOB: [birth year], 52 y.o.   MRN: [id number] Deborah Knight, a very pleasant 52 year old female presents for a follow-up of chronic conditions.  She is not exercising routinely and is mostly adherent to low salt diet. Patient does not check blood pressures at home.  Patient denies chest pain, chest pressure/discomfort, near-syncope, orthopnea, palpitations, syncope and tachypnea.   Hypertension  This is a chronic problem. The current episode started more than 1 year ago. The problem is unchanged. The problem is uncontrolled. Pertinent negatives include no anxiety, blurred vision, headaches, neck pain, peripheral edema, PND or sweats. Risk factors for coronary artery disease include diabetes mellitus and obesity. Past treatments include beta blockers and diuretics. There is no history of angina, kidney disease, CAD/MI, CVA, heart failure, left ventricular hypertrophy, PVD or retinopathy. There is no history of chronic renal disease, coarctation of the aorta, hyperaldosteronism, hypercortisolism, hyperparathyroidism, renovascular disease or sleep apnea.  Hyperlipidemia  This is a chronic problem. The current episode started more than 1 year ago. The problem is uncontrolled. Recent lipid tests were reviewed and are high. Exacerbating diseases include obesity. She has no history of chronic renal disease. Factors aggravating her hyperlipidemia include fatty foods. Pertinent negatives include no focal sensory loss, focal weakness, leg pain or myalgias. Current antihyperlipidemic treatment includes statins. Compliance problems include adherence to exercise and adherence to diet.  Risk factors for coronary artery disease include dyslipidemia, hypertension and obesity.  Asthma  She complains of cough and sputum production. There is no chest tightness or difficulty breathing. Primary symptoms comments: Upper respiratory symptoms have been present for  greater than 2 weeks. She has been taking antihistamines without relief. . The problem occurs intermittently. Associated symptoms include ear congestion, nasal congestion and postnasal drip. Pertinent negatives include no headaches, myalgias, PND or sweats. Her symptoms are aggravated by change in weather. Her symptoms are alleviated by steroid inhaler and beta-agonist. She reports significant improvement on treatment.     Past Medical History:  Diagnosis Date  . Anxiety   . Asthma   . HLD (hyperlipidemia)   . Hypertension   . Seasonal allergies     Immunization History  Administered Date(s) Administered  . DTP 08/03/2004  . Influenza Split 04/09/2012  . Influenza Whole 02/09/2004, 02/03/2007, 02/22/2008, 06/12/2009, 01/23/2010  . Influenza,inj,Quad PF,6+ Mos 12/14/2012, 11/17/2013, 01/31/2015, 11/22/2015  . Pneumococcal Polysaccharide-23 01/11/2004, 06/12/2009  . Td 06/29/2009   Social History   Socioeconomic History  . Marital status: Divorced    Spouse name: Not on file  . Number of children: Not on file  . Years of education: Not on file  . Highest education level: Not on file  Occupational History  . Not on file  Social Needs  . Financial resource strain: Not on file  . Food insecurity:    Worry: Not on file    Inability: Not on file  . Transportation needs:    Medical: Not on file    Non-medical: Not on file  Tobacco Use  . Smoking status: Former Smoker    Types: Cigars    Last attempt to quit: 11/09/2015    Years since quitting: 1.8  . Smokeless tobacco: Current User  . Tobacco comment: vapor   Substance and Sexual Activity  . Alcohol use: No  . Drug use: No  . Sexual activity: Not Currently    Birth control/protection: Condom, None  Lifestyle  . Physical activity:  Days per week: Not on file    Minutes per session: Not on file  . Stress: Not on file  Relationships  . Social connections:    Talks on phone: Not on file    Gets together: Not on file     Attends religious service: Not on file    Active member of club or organization: Not on file    Attends meetings of clubs or organizations: Not on file    Relationship status: Not on file  . Intimate partner violence:    Fear of current or ex partner: Not on file    Emotionally abused: Not on file    Physically abused: Not on file    Forced sexual activity: Not on file  Other Topics Concern  . Not on file  Social History Narrative   Employed as a Education officer, environmental.   Review of Systems  Constitutional: Negative for unexpected weight change.  HENT: Positive for postnasal drip.   Eyes: Negative.  Negative for blurred vision and visual disturbance.  Respiratory: Positive for cough and sputum production. Negative for chest tightness.   Cardiovascular: Negative.  Negative for PND.  Gastrointestinal: Negative.  Negative for constipation.  Endocrine: Negative.  Negative for polydipsia, polyphagia and polyuria.  Genitourinary: Negative.   Musculoskeletal: Negative for myalgias and neck pain.  Allergic/Immunologic: Positive for environmental allergies.  Neurological: Positive for weakness and numbness (Left upper extremity). Negative for dizziness, focal weakness, light-headedness and headaches.  Hematological: Negative.   Psychiatric/Behavioral: Negative for suicidal ideas.      Objective:   Physical Exam  Constitutional: She is oriented to person, place, and time. She appears well-developed and well-nourished.  HENT:  Head: Normocephalic and atraumatic.  Right Ear: Hearing, tympanic membrane, external ear and ear canal normal.  Left Ear: Hearing, tympanic membrane, external ear and ear canal normal.  Mouth/Throat: Oropharynx is clear and moist.  Eyes: Pupils are equal, round, and reactive to light. Conjunctivae and EOM are normal.  Neck: Normal range of motion. Neck supple.  Cardiovascular: Normal rate, regular rhythm, normal heart sounds and intact distal pulses.   Pulmonary/Chest: Effort normal and breath sounds normal.  Abdominal: Soft. Bowel sounds are normal.  Musculoskeletal: Normal range of motion.  Neurological: She is alert and oriented to person, place, and time. She has normal reflexes.  Skin: Skin is warm and dry.  Psychiatric: She has a normal mood and affect. Her behavior is normal. Judgment and thought content normal.      BP 126/82 (BP Location: Right Arm, Patient Position: Sitting, Cuff Size: Normal)   Pulse 79   Temp 98.4 F (36.9 C) (Oral)   Resp 16   Ht 5\' 2"  (1.575 m)   Wt 226 lb (102.5 kg)   LMP 08/13/2012   SpO2 100%   BMI 41.34 kg/m  Assessment & Plan:  1. Essential hypertension Blood pressure is at goal on current medication regimen. No medication changes warranted at this time.  Will review renal functioning as results become available.  -Continue medication, monitor blood pressure at home. Continue DASH diet. Reminded to report  to  ER if any CP, SOB, nausea, dizziness, severe HA, changes vision/speech, left arm numbness and tingling and jaw pain.    - Urinalysis Dipstick - Basic Metabolic Panel  2. Prediabetes  - HgB A1c  3. Hyperlipidemia LDL goal <100 The 10-year ASCVD risk score Denman George DC Jr., et al., 2013) is: 4.4%   Values used to calculate the score:  Age: [age] years     Sex: Female     Is Non-Hispanic African American: Yes     Diabetic: No     Tobacco smoker: No     Systolic Blood Pressure: 126 mmHg     Is BP treated: Yes     HDL Cholesterol: 50 mg/dL     Total Cholesterol: 210 mg/dL  4. Class 3 severe obesity due to excess calories with serious comorbidity and body mass index (BMI) of 40.0 to 44.9 in adult [facility](HCC) Recommend a lowfat, low carbohydrate diet divided over 5-6 small meals, increase water intake to 6-8 glasses, and 150 minutes per week of cardiovascular exercise.    5. Immunization due - Pneumococcal polysaccharide vaccine 23-valent greater than or equal to 2yo  subcutaneous/IM  6. Upper respiratory tract infection, unspecified type - azithromycin (ZITHROMAX) 250 MG tablet; Take 500 mg on day 1. On days 2-5 take 250 mg  Dispense: 6 tablet; Refill: 0   RTC: 6 months for chronic conditions.  Will follow up by phone with any abnormal laboratory results    The patient was given clear instructions to go to ER or return to medical center if symptoms do not improve, worsen or new problems develop. The patient verbalized understanding.   Nolon NationsLachina Moore Everly Rubalcava  MSN, FNP-C Patient Care [facility] [address]  [place], [place] [address] [phone]

## 2017-09-08 NOTE — Patient Instructions (Addendum)
For upper respiratory infection, will start Azithromycin 500 mg on day 1, take 250 mg on days 2-5.  Continue all prescribed medications.   Your blood pressure is at goal on current medication regimen  - Continue medication, monitor blood pressure at home. Continue DASH diet. Reminder to go to the ER if any CP, SOB, nausea, dizziness, severe HA, changes vision/speech, left arm numbness and tingling and jaw pain.   Recommend a lowfat, low carbohydrate diet divided over 5-6 small meals, increase water intake to 6-8 glasses, and 150 minutes per week of cardiovascular exercise.

## 2017-09-09 LAB — BASIC METABOLIC PANEL
BUN / CREAT RATIO: 20 (ref 9–23)
BUN: 10 mg/dL (ref 6–24)
CALCIUM: 9.3 mg/dL (ref 8.7–10.2)
CHLORIDE: 106 mmol/L (ref 96–106)
CO2: 23 mmol/L (ref 20–29)
Creatinine, Ser: 0.51 mg/dL — ABNORMAL LOW (ref 0.57–1.00)
GFR calc non Af Amer: 111 mL/min/{1.73_m2} (ref >59)
GFR, EST AFRICAN AMERICAN: 128 mL/min/{1.73_m2} (ref >59)
Glucose: 89 mg/dL (ref 65–99)
Potassium: 3.2 mmol/L — ABNORMAL LOW (ref 3.5–5.2)
Sodium: 142 mmol/L (ref 134–144)

## 2017-09-10 ENCOUNTER — Other Ambulatory Visit: Payer: Self-pay | Admitting: Family Medicine

## 2017-09-10 ENCOUNTER — Telehealth: Payer: Self-pay

## 2017-09-10 DIAGNOSIS — E876 Hypokalemia: Secondary | ICD-10-CM

## 2017-09-10 MED ORDER — POTASSIUM CHLORIDE ER 10 MEQ PO TBCR: 10.0000 meq | EXTENDED_RELEASE_TABLET | Freq: Every day | ORAL | 0 refills | Status: DC

## 2017-09-10 NOTE — Progress Notes (Signed)
Reviewed labs, potassium decreased. Will continue K-dur 10 mEq daily and recommend a potassium rich diet. Will repeat BMP in 1 month.   Orders Placed This Encounter  Procedures  . Basic metabolic panel    Standing Status:   Future    Standing Expiration Date:   09/11/2018   Nolon NationsLachina Moore Aleister Lady  MSN, FNP-C Patient Care [facility] [address]  , [place] [address] [phone]

## 2017-09-10 NOTE — Telephone Encounter (Signed)
-----   Message from Massie MaroonLachina M Hollis, [place]NP sent at 09/10/2017  4:27 AM EDT ----- Regarding: lab resutls Please inform patient that potassium is mildly decreased. Re-ordered K-dur 10 mEq daily and a potassium rich diet. Return to clinic in 1 month for BMP. Schedule lab appointment.    Nolon NationsLachina Moore Hollis  MSN, FNP-C Patient Care [facility] [address]  [place], [place] [address] [phone]

## 2017-09-10 NOTE — Telephone Encounter (Signed)
Called and spoke with patient. Advised that potassium was decreased and that we have reordered potassium tablets that she should take daily. Also advised patient to include potassium rich food in diet. Appointment was scheduled for 1 month for follow up bmp. Thanks!

## 2017-09-10 NOTE — Progress Notes (Signed)
Meds ordered this encounter  Medications  . potassium chloride (K-DUR) 10 MEQ tablet    Sig: Take 1 tablet (10 mEq total) by mouth daily.    Dispense:  7 tablet    Refill:  0    Deborah NationsLachina Moore Clemon Devaul  MSN, FNP-C Patient [facility] [address]  [place], [place] [address] [phone]

## 2017-09-17 ENCOUNTER — Ambulatory Visit: Payer: Self-pay

## 2017-09-17 DIAGNOSIS — Z23 Encounter for immunization: Secondary | ICD-10-CM

## 2017-09-17 MED ORDER — PNEUMOCOCCAL 13-VAL CONJ VACC IM SUSP
0.5000 mL | INTRAMUSCULAR | Status: AC
  Administered 2017-09-17: 0.5 mL via INTRAMUSCULAR

## 2017-10-13 ENCOUNTER — Other Ambulatory Visit: Payer: Self-pay

## 2017-10-20 ENCOUNTER — Other Ambulatory Visit: Payer: Self-pay

## 2017-10-20 DIAGNOSIS — E876 Hypokalemia: Secondary | ICD-10-CM

## 2017-10-21 LAB — BASIC METABOLIC PANEL
BUN/Creatinine Ratio: 12 (ref 9–23)
BUN: 7 mg/dL (ref 6–24)
CHLORIDE: 107 mmol/L — AB (ref 96–106)
CO2: 23 mmol/L (ref 20–29)
Calcium: 9.4 mg/dL (ref 8.7–10.2)
Creatinine, Ser: 0.58 mg/dL (ref 0.57–1.00)
GFR calc Af Amer: 123 mL/min/{1.73_m2} (ref >59)
GFR calc non Af Amer: 106 mL/min/{1.73_m2} (ref >59)
GLUCOSE: 91 mg/dL (ref 65–99)
POTASSIUM: 3.5 mmol/L (ref 3.5–5.2)
SODIUM: 145 mmol/L — AB (ref 134–144)

## 2017-11-26 ENCOUNTER — Ambulatory Visit (INDEPENDENT_AMBULATORY_CARE_PROVIDER_SITE_OTHER): Payer: Self-pay | Admitting: Family Medicine

## 2017-11-26 ENCOUNTER — Encounter: Payer: Self-pay | Admitting: Family Medicine

## 2017-11-26 VITALS — BP 144/93 | HR 94 | Temp 98.8°F | Resp 16 | Ht 62.0 in | Wt 220.0 lb

## 2017-11-26 DIAGNOSIS — L03312 Cellulitis of back [any part except buttock]: Secondary | ICD-10-CM

## 2017-11-26 MED ORDER — TRAMADOL HCL 50 MG PO TABS
50.0000 mg | ORAL_TABLET | Freq: Four times a day (QID) | ORAL | 0 refills | Status: DC | PRN
Start: 2017-11-26 — End: 2019-07-28

## 2017-11-26 MED ORDER — CEFTRIAXONE SODIUM 1 G IJ SOLR
1.0000 g | Freq: Once | INTRAMUSCULAR | Status: AC
  Administered 2017-11-26: 1 g via INTRAMUSCULAR

## 2017-11-26 MED ORDER — SULFAMETHOXAZOLE-TRIMETHOPRIM 800-160 MG PO TABS
1.0000 | ORAL_TABLET | Freq: Two times a day (BID) | ORAL | 0 refills | Status: AC
Start: 2017-11-26 — End: 2017-12-03

## 2017-11-26 MED FILL — traMADol HCL 50 MG TABS: 50 | 5 days supply | Qty: 20 | Fill #0

## 2017-11-26 MED FILL — SULFAMETHOXAZOLE-TMP DS TAB: 800-160 | 7 days supply | Qty: 14 | Fill #0

## 2017-11-26 NOTE — Progress Notes (Signed)
  [facility]   Progress Note: General Provider: Mike GipAndre Luca Dyar, FNP    SUBJECTIVE:   Deborah SalmLinda S Knight is a 52 y.o. female who  has a past medical history of Anxiety, Asthma, HLD (hyperlipidemia), Hypertension, and Seasonal allergies.. Patient presents today for Insect Bite (swollen/ painful bite on left backside ) patient with insect bite on Sunday. sttes that the area is now swollen and painful. Denies fever chills or night sweats.  Review of Systems  Constitutional: Negative for chills and fever.  Skin:       Painful region on left side of back   Psychiatric/Behavioral: Negative.   All other systems reviewed and are negative.    OBJECTIVE: BP (!) 144/93 (BP Location: Right Arm, Patient Position: Sitting, Cuff Size: Normal)   Pulse 94   Temp 98.8 F (37.1 C) (Oral)   Resp 16   Ht 5\' 2"  (1.575 m)   Wt 220 lb (99.8 kg)   LMP 08/13/2012   SpO2 100%   BMI 40.24 kg/m   Physical Exam  Skin:       ASSESSMENT/PLAN:   1. Cellulitis of skin of back Discussed ED if fever chills occur or if pain increases. Warm compresses to the area.  - cefTRIAXone (ROCEPHIN) injection 1 g - sulfamethoxazole-trimethoprim (BACTRIM DS,SEPTRA DS) 800-160 MG tablet; Take 1 tablet by mouth 2 (two) times daily for 7 days.  Dispense: 14 tablet; Refill: 0 - traMADol (ULTRAM) 50 MG tablet; Take 1 tablet (50 mg total) by mouth every 6 (six) hours as needed.  Dispense: 20 tablet; Refill: 0       The patient was given clear instructions to go to ER or return to medical center if symptoms do not improve, worsen or new problems develop. The patient verbalized understanding and agreed with plan of care.   Ms. Freda Jacksonndr L. Riley Lamouglas, FNP-BC Patient Care [facility] [address]  [place], [place] [address] [phone]     This note has been created with Dragon speech recognition software and smart phrase technology. Any  transcriptional errors are unintentional.

## 2017-11-26 NOTE — Patient Instructions (Signed)

## 2018-02-09 ENCOUNTER — Telehealth: Payer: Self-pay

## 2018-02-09 DIAGNOSIS — I10 Essential (primary) hypertension: Secondary | ICD-10-CM

## 2018-02-09 MED ORDER — AMLODIPINE BESYLATE 10 MG PO TABS: 10.0000 mg | ORAL_TABLET | Freq: Every day | ORAL | 5 refills | Status: DC

## 2018-02-09 MED ORDER — METOPROLOL TARTRATE 25 MG PO TABS: 12.5000 mg | ORAL_TABLET | Freq: Two times a day (BID) | ORAL | 5 refills | Status: DC

## 2018-02-09 MED ORDER — HYDROCHLOROTHIAZIDE 12.5 MG PO TABS: 12.5000 mg | ORAL_TABLET | Freq: Every day | ORAL | 5 refills | Status: DC

## 2018-02-09 NOTE — Telephone Encounter (Signed)
Refills sent into pharmacy. Thanks!  

## 2018-03-16 ENCOUNTER — Ambulatory Visit: Payer: Self-pay | Admitting: Family Medicine

## 2018-05-12 ENCOUNTER — Encounter (HOSPITAL_COMMUNITY): Payer: Self-pay

## 2018-05-12 ENCOUNTER — Ambulatory Visit (HOSPITAL_COMMUNITY)
Admission: EM | Admit: 2018-05-12 | Discharge: 2018-05-12 | Disposition: A | Discharge status: Home / Self Care | Payer: Self-pay | Attending: Family Medicine | Admitting: Family Medicine

## 2018-05-12 ENCOUNTER — Ambulatory Visit (INDEPENDENT_AMBULATORY_CARE_PROVIDER_SITE_OTHER): Payer: Self-pay

## 2018-05-12 DIAGNOSIS — M19071 Primary osteoarthritis, right ankle and foot: Secondary | ICD-10-CM

## 2018-05-12 DIAGNOSIS — M79671 Pain in right foot: Secondary | ICD-10-CM

## 2018-05-12 MED ORDER — METHYLPREDNISOLONE 4 MG PO TBPK: ORAL_TABLET | ORAL | 0 refills | Status: DC

## 2018-05-12 MED FILL — METHYLPREDNISOLONE 4 MG TAB: 4 | 6 days supply | Qty: 21 | Fill #0

## 2018-05-12 NOTE — Discharge Instructions (Signed)
Limit walking on foot Use ice for 20 minutes every 2-4 hours Elevate foot to reduce swelling Take Medrol Dosepak as prescribed Wear boot until foot pain improves, likely 1 week Follow-up with your primary care doctor if you fail to improve, or with the podiatry doctors

## 2018-05-12 NOTE — ED Triage Notes (Signed)
Pt presents with chronic right foot pain from unknown source for over a month; pain has got progressively worse.

## 2018-05-12 NOTE — ED Provider Notes (Signed)
[facility]    CSN: [id number] Arrival date & time: 05/12/18  1359     History   Chief Complaint Chief Complaint  Patient presents with  . Foot Pain    HPI Deborah Knight is a 53 y.o. female.   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  She states that she is on her feet constantly during this job.  It is much more standing and walking that her old job was.  Within 2 weeks of starting this job she started having right foot pain.  She is changed shoewear's.  She is changed socks.  She is changed shoe inserts.  She is tried over-the-counter medications.  Nothing is helping with her foot pain.  It is worse at the end of her shift that it is the beginning.  It is worse on days that she works it on days that she is off.  She never had a problem with foot pain before.  The pain is across the top of her foot.  No heel pain.  No early morning pain. Past Medical History:  Diagnosis Date  . Anxiety   . Asthma   . HLD (hyperlipidemia)   . Hypertension   . Seasonal allergies     Patient Active Problem List   Diagnosis Date Noted  . Prediabetes 11/17/2013  . Unspecified vitamin D deficiency 09/23/2013  . Groin strain 09/14/2013  . Other malaise and fatigue 08/23/2013  . Metabolic syndrome 08/23/2013  . Back pain 08/23/2013  . Angioedema 01/06/2013  . Sprain and strain of shoulder and upper arm 10/09/2012  . Bicipital tenosynovitis 09/04/2012  . Joint derangement, shoulder region 07/10/2012  . Closed dislocation of shoulder 07/10/2012  . Dislocation of left shoulder joint 05/22/2012  . Right facial swelling 04/07/2012  . Rash 04/07/2012  . Tobacco abuse 02/04/2012  . KNEE PAIN, RIGHT 02/07/2010  . GERD 10/10/2009  . Depression 09/13/2009  . ALLERGIC RHINITIS 06/12/2009  . Obesity 08/30/2008  . LOW BACK PAIN SYNDROME 08/30/2008  . HYPERLIPIDEMIA 11/03/2006  . Anxiety state 11/03/2006  . Essential hypertension 11/03/2006  . Asthma  11/03/2006    Past Surgical History:  Procedure Laterality Date  . CESAREAN SECTION  1997  . HERNIA REPAIR  1989  . SHOULDER ARTHROSCOPY    . TUBAL LIGATION  1997    OB History   No obstetric history on file.      Home Medications    Prior to Admission medications   Medication Sig Start Date End Date Taking? Authorizing Provider  acetaminophen (TYLENOL) 500 MG tablet Take 1 tablet (500 mg total) by mouth every 6 (six) hours as needed for moderate pain. 05/28/14   [name], FNP  albuterol (PROVENTIL HFA;VENTOLIN HFA) 108 (90 Base) MCG/ACT inhaler Inhale 2 puffs into the lungs every 6 (six) hours as needed for wheezing. Shortness of breath 05/30/17   [name], FNP  amLODipine (NORVASC) 10 MG tablet Take 1 tablet (10 mg total) by mouth daily. 02/09/18   Mike Gip, FNP  cetirizine (ZYRTEC) 10 MG tablet Take 1 tablet (10 mg total) by mouth daily. 05/30/17   [name], FNP  citalopram (CELEXA) 20 MG tablet Take 20 mg by mouth daily.    [provider]  fluticasone (FLONASE) 50 MCG/ACT nasal spray Place 2 sprays into both nostrils daily as needed (congestion). 05/30/17   [name], FNP  fluticasone-salmeterol (ADVAIR HFA) [phone] MCG/ACT inhaler Inhale 2 puffs into the  lungs 2 (two) times daily. 05/30/17   [name], FNP  gabapentin (NEURONTIN) 300 MG capsule Take 1 capsule (300 mg total) by mouth 3 (three) times daily. 05/30/17   [name], FNP  hydrochlorothiazide (HYDRODIURIL) 12.5 MG tablet Take 1 tablet (12.5 mg total) by mouth daily. 02/09/18   Mike Gip, FNP  methylPREDNISolone (MEDROL DOSEPAK) 4 MG TBPK tablet tad 05/12/18   Eustace Moore, MD  metoprolol tartrate (LOPRESSOR) 25 MG tablet Take 0.5 tablets (12.5 mg total) by mouth 2 (two) times daily. 02/09/18   Mike Gip, FNP  montelukast (SINGULAIR) 10 MG tablet Take 1 tablet (10 mg total) by mouth at bedtime. 05/30/17   [name], FNP  omeprazole (PRILOSEC)  20 MG capsule Take 1 capsule (20 mg total) by mouth daily. 05/25/17   Caccavale, Sophia, PA-C  potassium chloride (K-DUR) 10 MEQ tablet Take 1 tablet (10 mEq total) by mouth daily. 09/10/17   [name], FNP  traMADol (ULTRAM) 50 MG tablet Take 1 tablet (50 mg total) by mouth every 6 (six) hours as needed. 11/26/17   Mike Gip, FNP  triamcinolone cream (KENALOG) 0.1 % Apply 1 application topically 2 (two) times daily. 05/30/17   [name], FNP    Family History Family History  Problem Relation Age of Onset  . Asthma Father   . Cancer Father   . Asthma Sister   . Asthma Brother   . Asthma Maternal Grandmother   . Asthma Maternal Grandfather   . Asthma Paternal Grandmother   . Asthma Paternal Grandfather   . Hyperlipidemia Mother   . Hyperlipidemia Sister     Social History Social History   Tobacco Use  . Smoking status: Former Smoker    Types: Cigars    Last attempt to quit: 11/09/2015    Years since quitting: 2.5  . Smokeless tobacco: Current User  . Tobacco comment: vapor   Substance Use Topics  . Alcohol use: No  . Drug use: No     Allergies   Losartan; Shellfish-derived products; Toradol [ketorolac tromethamine]; and Latex   Review of Systems Review of Systems  Constitutional: Negative for chills and fever.  HENT: Negative for ear pain and sore throat.   Eyes: Negative for pain and visual disturbance.  Respiratory: Negative for cough and shortness of breath.   Cardiovascular: Negative for chest pain and palpitations.  Gastrointestinal: Negative for abdominal pain and vomiting.  Genitourinary: Negative for dysuria and hematuria.  Musculoskeletal: Positive for arthralgias and gait problem. Negative for back pain.  Skin: Negative for color change and rash.  Neurological: Negative for seizures and syncope.  All other systems reviewed and are negative.    Physical Exam Triage Vital Signs ED Triage Vitals  Enc Vitals Group     BP 05/12/18 1436  140/85     Pulse Rate 05/12/18 1436 85     Resp 05/12/18 1436 18     Temp 05/12/18 1436 (!) 97.4 F (36.3 C)     Temp Source 05/12/18 1436 Oral     SpO2 05/12/18 1436 96 %     Weight --      Height --      Head Circumference --      Peak Flow --      Pain Score 05/12/18 1438 10     Pain Loc --      Pain Edu? --      Excl. in GC? --    No data found.  Updated Vital Signs BP 140/85 (BP Location: Right Arm)   Pulse 85   Temp (!) 97.4 F (36.3 C) (Oral)   Resp 18   LMP 08/13/2012   SpO2 96%   Visual Acuity Right Eye Distance:   Left Eye Distance:   Bilateral Distance:    Right Eye Near:   Left Eye Near:    Bilateral Near:     Physical Exam Constitutional:      General: She is not in acute distress.    Appearance: She is well-developed.  HENT:     Head: Normocephalic and atraumatic.  Eyes:     Conjunctiva/sclera: Conjunctivae normal.     Pupils: Pupils are equal, round, and reactive to light.  Neck:     Musculoskeletal: Normal range of motion.  Cardiovascular:     Rate and Rhythm: Normal rate.     Pulses:          Dorsalis pedis pulses are 2+ on the right side and 2+ on the left side.       Posterior tibial pulses are 2+ on the right side and 2+ on the left side.  Pulmonary:     Effort: Pulmonary effort is normal. No respiratory distress.  Abdominal:     General: There is no distension.     Palpations: Abdomen is soft.  Musculoskeletal: Normal range of motion.     Right foot: Normal range of motion. No bunion.     Left foot: Normal range of motion. No bunion.       Feet:  Feet:     Right foot:     Skin integrity: Skin integrity normal.     Left foot:     Skin integrity: Skin integrity normal.  Skin:    General: Skin is warm and dry.  Neurological:     Mental Status: She is alert.      UC Treatments / Results  Labs (all labs ordered are listed, but only abnormal results are displayed) Labs Reviewed - No data to  display  EKG None  Radiology Dg Foot Complete Right  Result Date: 05/12/2018 CLINICAL DATA:  Right foot pain for the past week, dorsally. No known injury. EXAM: RIGHT FOOT COMPLETE - 3+ VIEW COMPARISON:  None. FINDINGS: Mild dorsal tarsal spur formation with overlying soft tissue swelling. Moderate-sized calcaneal spurs. Otherwise, unremarkable examination. IMPRESSION: Degenerative changes, as described above, with dorsal soft tissue swelling at the level of the tarsals. Electronically Signed   By: Beckie SaltsSteven  Reid M.D.   On: 05/12/2018 15:45    Procedures Procedures (including critical care time)  Medications Ordered in UC Medications - No data to display  Initial Impression / Assessment and Plan / UC Course  I have reviewed the triage vital signs and the nursing notes.  Pertinent labs & imaging results that were available during my care of the patient were reviewed by me and considered in my medical decision making (see chart for details).     Patient appears to have some midfoot arthritis that is symptomatic.  I recommended ice, immobilization with fracture shoe, anti-inflammatory medicines.  She is allergic to Toradol so we will treat her with steroids.  Follow-up with podiatry if needed Final Clinical Impressions(s) / UC Diagnoses   Final diagnoses:  Foot pain, right  Arthritis of midtarsal joint of right foot     Discharge Instructions     Limit walking on foot Use ice for 20 minutes every 2-4 hours Elevate foot to reduce swelling Take  Medrol Dosepak as prescribed Wear boot until foot pain improves, likely 1 week Follow-up with your primary care doctor if you fail to improve, or with the podiatry doctors    ED Prescriptions    Medication Sig Dispense Auth. Provider   methylPREDNISolone (MEDROL DOSEPAK) 4 MG TBPK tablet tad 21 tablet Eustace Moore, MD     Controlled Substance Prescriptions [place] Controlled Substance Registry consulted? Not Applicable   Eustace Moore, MD 05/12/18 [phone]

## 2018-07-10 NOTE — Telephone Encounter (Signed)
Message sent to provider 

## 2018-09-14 ENCOUNTER — Ambulatory Visit: Payer: Self-pay

## 2018-09-23 ENCOUNTER — Ambulatory Visit: Payer: Self-pay | Attending: Family Medicine

## 2018-09-23 ENCOUNTER — Other Ambulatory Visit: Payer: Self-pay

## 2018-09-30 ENCOUNTER — Telehealth: Payer: Self-pay | Admitting: Family Medicine

## 2018-09-30 NOTE — Telephone Encounter (Signed)
Pt was informed that we sent the documents to [facility] to be review now we are waiting

## 2018-09-30 NOTE — Telephone Encounter (Signed)
Pt has questions concerning financial..please follow up

## 2018-10-14 ENCOUNTER — Encounter: Payer: Self-pay | Admitting: Family Medicine

## 2018-10-14 ENCOUNTER — Other Ambulatory Visit: Payer: Self-pay

## 2018-10-14 ENCOUNTER — Ambulatory Visit (INDEPENDENT_AMBULATORY_CARE_PROVIDER_SITE_OTHER): Payer: Self-pay | Admitting: Family Medicine

## 2018-10-14 VITALS — BP 141/90 | HR 88 | Temp 98.0°F | Resp 16 | Ht 62.0 in | Wt 227.0 lb

## 2018-10-14 DIAGNOSIS — M84374G Stress fracture, right foot, subsequent encounter for fracture with delayed healing: Secondary | ICD-10-CM

## 2018-10-14 DIAGNOSIS — Z1239 Encounter for other screening for malignant neoplasm of breast: Secondary | ICD-10-CM

## 2018-10-14 DIAGNOSIS — K219 Gastro-esophageal reflux disease without esophagitis: Secondary | ICD-10-CM

## 2018-10-14 DIAGNOSIS — Z1211 Encounter for screening for malignant neoplasm of colon: Secondary | ICD-10-CM

## 2018-10-14 DIAGNOSIS — J4520 Mild intermittent asthma, uncomplicated: Secondary | ICD-10-CM

## 2018-10-14 DIAGNOSIS — Z131 Encounter for screening for diabetes mellitus: Secondary | ICD-10-CM

## 2018-10-14 DIAGNOSIS — I10 Essential (primary) hypertension: Secondary | ICD-10-CM

## 2018-10-14 DIAGNOSIS — J302 Other seasonal allergic rhinitis: Secondary | ICD-10-CM

## 2018-10-14 DIAGNOSIS — E876 Hypokalemia: Secondary | ICD-10-CM

## 2018-10-14 DIAGNOSIS — G629 Polyneuropathy, unspecified: Secondary | ICD-10-CM

## 2018-10-14 DIAGNOSIS — N760 Acute vaginitis: Secondary | ICD-10-CM

## 2018-10-14 LAB — POCT URINALYSIS DIPSTICK
Bilirubin, UA: NEGATIVE
Blood, UA: NEGATIVE
Glucose, UA: NEGATIVE
Ketones, UA: NEGATIVE
Nitrite, UA: NEGATIVE
Protein, UA: NEGATIVE
Spec Grav, UA: 1.02 (ref 1.010–1.025)
Urobilinogen, UA: 0.2 E.U./dL
pH, UA: 7 (ref 5.0–8.0)

## 2018-10-14 LAB — POCT GLYCOSYLATED HEMOGLOBIN (HGB A1C): Hemoglobin A1C: 5.7 % — AB (ref 4.0–5.6)

## 2018-10-14 MED ORDER — OMEPRAZOLE 20 MG PO CPDR: 20.0000 mg | DELAYED_RELEASE_CAPSULE | Freq: Every day | ORAL | 3 refills | Status: DC

## 2018-10-14 MED ORDER — CITALOPRAM HYDROBROMIDE 20 MG PO TABS: 20.0000 mg | ORAL_TABLET | Freq: Every day | ORAL | 1 refills | Status: DC

## 2018-10-14 MED ORDER — MONTELUKAST SODIUM 10 MG PO TABS: 10.0000 mg | ORAL_TABLET | Freq: Every day | ORAL | 3 refills | Status: DC

## 2018-10-14 MED ORDER — ALBUTEROL SULFATE HFA 108 (90 BASE) MCG/ACT IN AERS: 2.0000 | INHALATION_SPRAY | Freq: Four times a day (QID) | RESPIRATORY_TRACT | 5 refills | Status: AC | PRN

## 2018-10-14 MED ORDER — POTASSIUM CHLORIDE ER 10 MEQ PO TBCR: 10.0000 meq | EXTENDED_RELEASE_TABLET | Freq: Every day | ORAL | 1 refills | Status: DC

## 2018-10-14 MED ORDER — FLUTICASONE-SALMETEROL 115-21 MCG/ACT IN AERO: 2.0000 | INHALATION_SPRAY | Freq: Two times a day (BID) | RESPIRATORY_TRACT | 11 refills | Status: DC

## 2018-10-14 MED ORDER — CETIRIZINE HCL 10 MG PO TABS: 10.0000 mg | ORAL_TABLET | Freq: Every day | ORAL | 11 refills | Status: DC

## 2018-10-14 MED ORDER — FLUTICASONE PROPIONATE 50 MCG/ACT NA SUSP: 2.0000 | Freq: Every day | NASAL | 3 refills | Status: DC | PRN

## 2018-10-14 MED ORDER — HYDROCHLOROTHIAZIDE 12.5 MG PO TABS: 12.5000 mg | ORAL_TABLET | Freq: Every day | ORAL | 5 refills | Status: DC

## 2018-10-14 MED ORDER — FLUCONAZOLE 150 MG PO TABS: 150.0000 mg | ORAL_TABLET | ORAL | 0 refills | Status: AC

## 2018-10-14 MED ORDER — AMLODIPINE BESYLATE 10 MG PO TABS: 10.0000 mg | ORAL_TABLET | Freq: Every day | ORAL | 5 refills | Status: DC

## 2018-10-14 MED ORDER — METOPROLOL TARTRATE 25 MG PO TABS: 12.5000 mg | ORAL_TABLET | Freq: Two times a day (BID) | ORAL | 5 refills | Status: DC

## 2018-10-14 MED ORDER — GABAPENTIN 300 MG PO CAPS: 300.0000 mg | ORAL_CAPSULE | Freq: Three times a day (TID) | ORAL | 1 refills | Status: DC

## 2018-10-14 MED FILL — ADVAIR HFA 115-21 MCG INH: 115-21 | 30 days supply | Qty: 12 | Fill #0

## 2018-10-14 NOTE — Patient Instructions (Addendum)
I put in a referral for you to have a mammogram and colonoscopy.  Stress Fracture  A stress fracture is a small break or crack in a bone. A stress fracture can be fully broken (complete) or partially broken (incomplete). The most common sites for stress fractures are the bones in the front of your feet (metatarsals), your heel (calcaneus), and the long bone of your lower leg (tibia). What are the causes? This condition is caused by overuse or repetitive exercise, such as running. It happens when a bone cannot absorb any more shock because the muscles around it are weak. Stress fractures happen most commonly when:  You rapidly increase or start a new physical activity.  You use shoes that are worn out or do not fit properly.  You exercise on a new surface. What increases the risk? You are more likely to develop this condition if:  You have a condition that causes weak bones (osteoporosis).  You are female. Stress fractures are more likely to occur in women. What are the signs or symptoms? The most common symptom of a stress fracture is feeling pain when you are using or putting weight on the affected part of your body. The pain usually improves when you are resting. Other symptoms may include:  Swelling of the affected area.  Pain in the area when it is touched. Stress fracture pain usually develops over time. How is this diagnosed? This condition may be diagnosed by:  Your symptoms.  Your medical history.  A physical exam.  Imaging tests, such as: ? X-rays. ? MRI. ? Bone scan. How is this treated? Treatment depends on the severity of your stress fracture. It is commonly treated with resting, icing, compression, and elevation (RICE therapy). Treatment may also include:  Medicines to reduce inflammation.  A cast or a walking shoe.  Crutches.  Surgery. This is usually only in severe cases. Follow these instructions at home: If you have a cast:  Do not put pressure on  any part of the cast until it is fully hardened. This may take several hours.  Do not stick anything inside the cast to scratch your skin. Doing that increases your risk of infection.  Check the skin around the cast every day. Tell your health care provider about any concerns.  You may put lotion on dry skin around the edges of the cast. Do not put lotion on the skin underneath the cast.  Keep the cast clean.  If the cast is not waterproof: ? Do not let it get wet. ? Cover it with a watertight covering when you take a bath or a shower. If you have a walking shoe:  Wear the shoe as told by your health care provider. Remove it only as told by your health care provider.  Loosen the shoe if your toes tingle, become numb, or turn cold and blue.  Keep the shoe clean.  If the shoe is not waterproof: ? Do not let it get wet. Managing pain, stiffness, and swelling   If directed, apply ice to the injured area: ? If you have a walking shoe, remove the shoe as told by your health care provider. ? Put ice in a plastic bag. ? Place a towel between your skin and the bag or between your cast and the bag. ? Leave the ice on for 20 minutes, 2-3 times per day.  Move your toes often to avoid stiffness and to lessen swelling.  Raise (elevate) the injured area above the  level of your heart while you are sitting or lying down. Activity  Rest as directed by your health care provider. Ask your health care provider if you may do alternative exercises, such as swimming or biking, while you are healing.  Return to your normal activities as directed by your health care provider. Ask your health care provider what activities are safe for you.  Perform range-of-motion exercises only as directed by your health care provider. General instructions  Do not use the injured limb to support yourbody weight until your health care provider says that you can. Use crutches if your health care provider tells you  to do so.  Do not use any products that contain nicotine or tobacco, such as cigarettes and e-cigarettes. These can delay bone healing. If you need help quitting, ask your health care provider.  Take over-the-counter and prescription medicines only as told by your health care provider.  Keep all follow-up visits as told by your health care provider. This is important. How is this prevented?  Only wear shoes that: ? Fit well. ? Are not worn out.  Eat a healthy diet that contains vitamin D and calcium. This helps keep your bones strong. Good sources of calcium and vitamin D include: ? Low-fat dairy products such as milk, yogurt, and cheese. ? Certain fish, such as fresh or canned salmon, tuna, and sardines. ? Products that have calcium and vitamin D added to them (fortified products), such as fortified cereals or juice.  Be careful when you start a new physical activity. Give your body time to adjust.  Avoid doing only one kind of activity. Do different exercises, such as swimming and running, so that no single part of your body gets overused.  Do strength-training exercises. Contact a health care provider if:  Your pain gets worse.  You have new symptoms.  You have increased swelling. Get help right away if:  You lose feeling in the injured area. Summary  A stress fracture is a small break or crack in a bone. A stress fracture can be fully broken (complete) or partially broken (incomplete).  This condition is caused by overuse or repetitive exercise, such as running.  The most common symptom of a stress fracture is feeling pain when you are using or putting weight on the affected part of your body.  Treatment depends on the severity of your stress fracture. This information is not intended to replace advice given to you by your health care provider. Make sure you discuss any questions you have with your health care provider. Document Released: 05/18/2002 Document Revised:  04/08/2017 Document Reviewed: 04/08/2017 Elsevier Patient Education  2020 [employer].

## 2018-10-14 NOTE — Progress Notes (Signed)
[facility]   Progress Note: General Provider: Mike GipAndre Ollen Rao, FNP  SUBJECTIVE:   Deborah Knight is a 53 y.o. female who  has a past medical history of Anxiety, Asthma, HLD (hyperlipidemia), Hypertension, and Seasonal allergies.. Patient presents today for Hypertension, Foot Pain (has follow up with foot and ankle tomorrow. wants to see somebody else ), and Back Pain (back spasms )  Patient states that she has a foot fracture. Pain began in January. She was initially seen by Dr. Elijah Birkom at Dr. [facility] and given a orthopedic boot. She states that she now has [facility] [employer] and is unable to afford going to Dr. Elijah Birkom. She would like to have a referral to a [facility] podiatrist for further evaluation and treatment.   Review of Systems  Constitutional: Negative.   HENT: Negative.   Eyes: Negative.   Respiratory: Negative.   Cardiovascular: Negative.   Gastrointestinal: Negative.   Genitourinary: Negative.   Musculoskeletal: Positive for joint pain (right lower extremity).  Skin: Negative.   Neurological: Negative.   Psychiatric/Behavioral: Negative.      OBJECTIVE: BP (!) 141/90 (BP Location: Right Arm, Patient Position: Sitting, Cuff Size: Large)   Pulse 88   Temp 98 F (36.7 C) (Oral)   Resp 16   Ht 5\' 2"  (1.575 m)   Wt 227 lb (103 kg)   LMP 08/13/2012   SpO2 100%   BMI 41.52 kg/m   Wt Readings from Last 3 Encounters:  10/14/18 227 lb (103 kg)  11/26/17 220 lb (99.8 kg)  09/08/17 226 lb (102.5 kg)     Physical Exam Vitals signs and nursing note reviewed.  Constitutional:      General: She is not in acute distress.    Appearance: Normal appearance.  HENT:     Head: Normocephalic and atraumatic.  Eyes:     Extraocular Movements: Extraocular movements intact.     Conjunctiva/sclera: Conjunctivae normal.     Pupils: Pupils are equal, round, and reactive to light.  Cardiovascular:     Rate and  Rhythm: Normal rate and regular rhythm.     Heart sounds: No murmur.  Pulmonary:     Effort: Pulmonary effort is normal.     Breath sounds: Normal breath sounds.  Musculoskeletal: Normal range of motion.        General: Signs of injury (ortho boot noted to the right foot. ) present.  Skin:    General: Skin is warm and dry.  Neurological:     Mental Status: She is alert and oriented to person, place, and time.  Psychiatric:        Mood and Affect: Mood normal.        Behavior: Behavior normal.        Thought Content: Thought content normal.        Judgment: Judgment normal.     ASSESSMENT/PLAN:  1. Essential hypertension - Urinalysis Dipstick - amLODipine (NORVASC) 10 MG tablet; Take 1 tablet (10 mg total) by mouth daily.  Dispense: 30 tablet; Refill: 5 - hydrochlorothiazide (HYDRODIURIL) 12.5 MG tablet; Take 1 tablet (12.5 mg total) by mouth daily.  Dispense: 30 tablet; Refill: 5 - metoprolol tartrate (LOPRESSOR) 25 MG tablet; Take 0.5 tablets (12.5 mg total) by mouth 2 (two) times daily.  Dispense: 60 tablet; Refill: 5 - Lipid Panel - Comprehensive metabolic panel  2. Mild intermittent asthma without complication - albuterol (VENTOLIN HFA) 108 (90 Base) MCG/ACT inhaler; Inhale  2 puffs into the lungs every 6 (six) hours as needed for wheezing. Shortness of breath  Dispense: 6.7 g; Refill: 5 - fluticasone-salmeterol (ADVAIR HFA) 115-21 MCG/ACT inhaler; Inhale 2 puffs into the lungs 2 (two) times daily.  Dispense: 1 Inhaler; Refill: 11 - montelukast (SINGULAIR) 10 MG tablet; Take 1 tablet (10 mg total) by mouth at bedtime.  Dispense: 90 tablet; Refill: 3  3. Chronic seasonal allergic rhinitis - cetirizine (ZYRTEC) 10 MG tablet; Take 1 tablet (10 mg total) by mouth daily.  Dispense: 30 tablet; Refill: 11  4. Neuropathy - gabapentin (NEURONTIN) 300 MG capsule; Take 1 capsule (300 mg total) by mouth 3 (three) times daily.  Dispense: 90 capsule; Refill: 1  5. Gastroesophageal reflux  disease without esophagitis - omeprazole (PRILOSEC) 20 MG capsule; Take 1 capsule (20 mg total) by mouth daily.  Dispense: 90 capsule; Refill: 3  6. Hypokalemia - potassium chloride (K-DUR) 10 MEQ tablet; Take 1 tablet (10 mEq total) by mouth daily.  Dispense: 30 tablet; Refill: 1  7. Stress fracture of right foot with delayed healing, subsequent encounter - Ambulatory referral to Podiatry - VITAMIN D 25 Hydroxy (Vit-D Deficiency, Fractures)  8. Acute vaginitis - fluconazole (DIFLUCAN) 150 MG tablet; Take 1 tablet (150 mg total) by mouth every 3 (three) days for 2 doses.  Dispense: 2 tablet; Refill: 0  9. Screening for breast cancer - MM Digital Screening; Future - [facility] MAMMOGRAPHY  10. Screen for colon cancer - Ambulatory referral to Gastroenterology - [facility] COLONOSCOPY  11. Screening for diabetes mellitus - HgB A1c     Return in about 3 months (around 01/14/2019) for htn.    The patient was given clear instructions to go to ER or return to medical center if symptoms do not improve, worsen or new problems develop. The patient verbalized understanding and agreed with plan of care.   [name], FNP-BC [facility] [address] [place], [place] [address] [phone]

## 2018-10-15 LAB — COMPREHENSIVE METABOLIC PANEL
ALT: 19 IU/L (ref 0–32)
AST: 18 IU/L (ref 0–40)
Albumin/Globulin Ratio: 1.7 (ref 1.2–2.2)
Albumin: 4.5 g/dL (ref 3.8–4.9)
Alkaline Phosphatase: 83 IU/L (ref 39–117)
BUN/Creatinine Ratio: 14 (ref 9–23)
BUN: 8 mg/dL (ref 6–24)
Bilirubin Total: 0.3 mg/dL (ref 0.0–1.2)
CO2: 22 mmol/L (ref 20–29)
Calcium: 9.5 mg/dL (ref 8.7–10.2)
Chloride: 104 mmol/L (ref 96–106)
Creatinine, Ser: 0.56 mg/dL — ABNORMAL LOW (ref 0.57–1.00)
GFR calc Af Amer: 123 mL/min/{1.73_m2} (ref >59)
GFR calc non Af Amer: 107 mL/min/{1.73_m2} (ref >59)
Globulin, Total: 2.6 g/dL (ref 1.5–4.5)
Glucose: 92 mg/dL (ref 65–99)
Potassium: 3.4 mmol/L — ABNORMAL LOW (ref 3.5–5.2)
Sodium: 143 mmol/L (ref 134–144)
Total Protein: 7.1 g/dL (ref 6.0–8.5)

## 2018-10-15 LAB — LIPID PANEL
Chol/HDL Ratio: 5.1 ratio — ABNORMAL HIGH (ref 0.0–4.4)
Cholesterol, Total: 243 mg/dL — ABNORMAL HIGH (ref 100–199)
HDL: 48 mg/dL (ref >39)
LDL Calculated: 170 mg/dL — ABNORMAL HIGH (ref 0–99)
Triglycerides: 126 mg/dL (ref 0–149)
VLDL Cholesterol Cal: 25 mg/dL (ref 5–40)

## 2018-10-15 LAB — VITAMIN D 25 HYDROXY (VIT D DEFICIENCY, FRACTURES): Vit D, 25-Hydroxy: 11.9 ng/mL — ABNORMAL LOW (ref 30.0–100.0)

## 2018-10-22 ENCOUNTER — Other Ambulatory Visit (HOSPITAL_COMMUNITY): Payer: Self-pay | Admitting: *Deleted

## 2018-10-22 DIAGNOSIS — Z1231 Encounter for screening mammogram for malignant neoplasm of breast: Secondary | ICD-10-CM

## 2018-10-23 MED ORDER — VITAMIN D (ERGOCALCIFEROL) 1.25 MG (50000 UNIT) PO CAPS: 50000.0000 [IU] | ORAL_CAPSULE | ORAL | 0 refills | Status: AC

## 2018-10-23 NOTE — Progress Notes (Signed)
Your labs are stable. Continue with your current medications. Please remember to keep your follow up appointment. If you have problems, questions or concerns, please make an appointment to discuss. Thanks!

## 2018-10-23 NOTE — Addendum Note (Signed)
Addended by: Genelle Bal on: 10/23/2018 04:28 PM   Modules accepted: Orders

## 2018-10-26 MED FILL — VIT D2 1.25 MG (50,000 UNIT: 1.25 MG | 28 days supply | Qty: 4 | Fill #0

## 2018-10-29 ENCOUNTER — Ambulatory Visit: Payer: No Typology Code available for payment source

## 2018-10-29 ENCOUNTER — Encounter: Payer: Self-pay | Admitting: Podiatry

## 2018-10-29 ENCOUNTER — Other Ambulatory Visit: Payer: Self-pay

## 2018-10-29 ENCOUNTER — Ambulatory Visit: Payer: No Typology Code available for payment source | Admitting: Podiatry

## 2018-10-29 ENCOUNTER — Other Ambulatory Visit: Payer: Self-pay | Admitting: Podiatry

## 2018-10-29 VITALS — Temp 98.4°F

## 2018-10-29 DIAGNOSIS — M19079 Primary osteoarthritis, unspecified ankle and foot: Secondary | ICD-10-CM

## 2018-10-29 DIAGNOSIS — M79671 Pain in right foot: Secondary | ICD-10-CM

## 2018-10-29 NOTE — Patient Instructions (Signed)
Recommend Voltaren gel for arthritis, available OTC.

## 2018-11-04 MED FILL — ?CETIRIZINE HCL 10 MG TABLE: 10 | 30 days supply | Qty: 30 | Fill #0

## 2018-11-04 MED FILL — !VENTOLIN HFA INHALER: 108 (90 BAS | 25 days supply | Qty: 18 | Fill #0

## 2018-11-04 MED FILL — ADVAIR HFA 115-21 MCG INH: 115-21 | 30 days supply | Qty: 12 | Fill #0

## 2018-11-04 MED FILL — GABAPENTIN 300 MG CAPSULE: 300 | 30 days supply | Qty: 90 | Fill #0

## 2018-11-04 MED FILL — FLUCONAZOLE 150 MG TABLET: 150 | 3 days supply | Qty: 2 | Fill #0

## 2018-11-04 MED FILL — MONTELUKAST SOD 10 MG TAB: 10 | 30 days supply | Qty: 30 | Fill #0

## 2018-11-04 MED FILL — FLUTICASONE PROP 50 MCG SPR: 50 | 30 days supply | Qty: 16 | Fill #0

## 2018-11-05 ENCOUNTER — Telehealth: Payer: Self-pay | Admitting: Podiatry

## 2018-11-05 MED FILL — ?CITALOPRAM HBR 20 MG TABLE: 20 | 30 days supply | Qty: 30 | Fill #0

## 2018-11-05 NOTE — Telephone Encounter (Signed)
Called Deborah Knight at [employer] and told him we have the paperwork and that the lady who takes care of it is currently working on it.

## 2018-11-05 NOTE — Telephone Encounter (Signed)
I'm calling to check to see if you received the paperwork I faxed on Ms. [name].

## 2018-11-13 ENCOUNTER — Ambulatory Visit: Payer: Self-pay | Admitting: Family Medicine

## 2018-11-17 ENCOUNTER — Encounter: Payer: Self-pay | Admitting: Gastroenterology

## 2018-11-18 ENCOUNTER — Encounter (HOSPITAL_COMMUNITY): Payer: Self-pay

## 2018-11-18 ENCOUNTER — Other Ambulatory Visit: Payer: Self-pay

## 2018-11-18 ENCOUNTER — Ambulatory Visit (INDEPENDENT_AMBULATORY_CARE_PROVIDER_SITE_OTHER): Payer: Self-pay | Admitting: Family Medicine

## 2018-11-18 ENCOUNTER — Encounter (HOSPITAL_COMMUNITY): Payer: Self-pay | Admitting: *Deleted

## 2018-11-18 ENCOUNTER — Encounter: Payer: Self-pay | Admitting: Family Medicine

## 2018-11-18 VITALS — BP 137/86 | HR 95 | Temp 99.0°F | Resp 16 | Ht 62.0 in | Wt 224.0 lb

## 2018-11-18 DIAGNOSIS — Z23 Encounter for immunization: Secondary | ICD-10-CM

## 2018-11-18 DIAGNOSIS — M5431 Sciatica, right side: Secondary | ICD-10-CM

## 2018-11-18 MED ORDER — METHYLPREDNISOLONE SODIUM SUCC 125 MG IJ SOLR
125.0000 mg | Freq: Once | INTRAMUSCULAR | Status: AC
  Administered 2018-11-18: 125 mg via INTRAMUSCULAR

## 2018-11-18 MED ORDER — CYCLOBENZAPRINE HCL 10 MG PO TABS: 10.0000 mg | ORAL_TABLET | Freq: Every day | ORAL | 0 refills | Status: DC

## 2018-11-18 MED ORDER — NAPROXEN 500 MG PO TABS: 500.0000 mg | ORAL_TABLET | Freq: Two times a day (BID) | ORAL | 0 refills | Status: DC

## 2018-11-18 MED FILL — NAPROXEN 500 MG TABLET: 500 | 30 days supply | Qty: 30 | Fill #0

## 2018-11-18 MED FILL — CYCLOBENZAPRINE 10 MG TAB: 10 | 10 days supply | Qty: 10 | Fill #0

## 2018-11-18 NOTE — Progress Notes (Signed)
  [facility]   Progress Note: Sick Visit Provider: Lanae Boast, FNP  SUBJECTIVE:   Deborah Knight is a 53 y.o. female who  has a past medical history of Anxiety, Asthma, HLD (hyperlipidemia), Hypertension, and Seasonal allergies.. Patient presents today for Back Pain (lower back pain that shoots down right leg ) Back Pain This is a recurrent problem. The current episode started more than 1 year ago. The problem occurs intermittently. The problem has been gradually worsening since onset. The pain is present in the lumbar spine. The quality of the pain is described as shooting. The pain radiates to the right thigh and right knee. The pain is moderate. The pain is worse during the night. The symptoms are aggravated by position. Stiffness is present in the morning. Associated symptoms include tingling. Pertinent negatives include no bladder incontinence or bowel incontinence. Risk factors include obesity, menopause, poor posture, sedentary lifestyle and lack of exercise. She has tried nothing for the symptoms.    Review of Systems  Gastrointestinal: Negative for bowel incontinence.  Genitourinary: Negative for bladder incontinence.  Musculoskeletal: Positive for back pain.  Neurological: Positive for tingling.  All other systems reviewed and are negative.    OBJECTIVE: BP 137/86 (BP Location: Left Arm, Patient Position: Sitting, Cuff Size: Normal)   Pulse 95   Temp 99 F (37.2 C) (Oral)   Resp 16   Ht 5\' 2"  (1.575 m)   Wt 224 lb (101.6 kg)   LMP 08/13/2012   SpO2 100%   BMI 40.97 kg/m   Wt Readings from Last 3 Encounters:  11/18/18 224 lb (101.6 kg)  10/14/18 227 lb (103 kg)  11/26/17 220 lb (99.8 kg)     Physical Exam Constitutional:      General: She is not in acute distress.    Appearance: Normal appearance. She is obese.  HENT:     Head: Normocephalic and atraumatic.  Musculoskeletal:     Lumbar back: She exhibits  decreased range of motion, pain and spasm.  Neurological:     Mental Status: She is alert.     ASSESSMENT/PLAN:   1. Sciatica of right side - methylPREDNISolone sodium succinate (SOLU-MEDROL) 125 mg/2 mL injection 125 mg - cyclobenzaprine (FLEXERIL) 10 MG tablet; Take 1 tablet (10 mg total) by mouth at bedtime.  Dispense: 10 tablet; Refill: 0 - naproxen (NAPROSYN) 500 MG tablet; Take 1 tablet (500 mg total) by mouth 2 (two) times daily with a meal.  Dispense: 30 tablet; Refill: 0   Influenza vaccination given in the office visit today.       The patient was given clear instructions to go to ER or return to medical center if symptoms do not improve, worsen or new problems develop. The patient verbalized understanding and agreed with plan of care.   [name], FNP-BC [facility] [address] [place], [place] [address] [phone]     This note has been created with Dragon speech recognition software and smart phrase technology. Any transcriptional errors are unintentional.

## 2018-11-18 NOTE — Patient Instructions (Signed)
Sciatica ° °Sciatica is pain, weakness, tingling, or loss of feeling (numbness) along the sciatic nerve. The sciatic nerve starts in the lower back and goes down the back of each leg. Sciatica usually goes away on its own or with treatment. Sometimes, sciatica may come back (recur). °What are the causes? °This condition happens when the sciatic nerve is pinched or has pressure put on it. This may be the result of: °· A disk in between the bones of the spine bulging out too far (herniated disk). °· Changes in the spinal disks that occur with aging. °· A condition that affects a muscle in the butt. °· Extra bone growth near the sciatic nerve. °· A break (fracture) of the area between your hip bones (pelvis). °· Pregnancy. °· Tumor. This is rare. °What increases the risk? °You are more likely to develop this condition if you: °· Play sports that put pressure or stress on the spine. °· Have poor strength and ease of movement (flexibility). °· Have had a back injury in the past. °· Have had back surgery. °· Sit for long periods of time. °· Do activities that involve bending or lifting over and over again. °· Are very overweight (obese). °What are the signs or symptoms? °Symptoms can vary from mild to very bad. They may include: °· Any of these problems in the lower back, leg, hip, or butt: °? Mild tingling, loss of feeling, or dull aches. °? Burning sensations. °? Sharp pains. °· Loss of feeling in the back of the calf or the sole of the foot. °· Leg weakness. °· Very bad back pain that makes it hard to move. °These symptoms may get worse when you cough, sneeze, or laugh. They may also get worse when you sit or stand for long periods of time. °How is this treated? °This condition often gets better without any treatment. However, treatment may include: °· Changing or cutting back on physical activity when you have pain. °· Doing exercises and stretching. °· Putting ice or heat on the affected area. °· Medicines that  help: °? To relieve pain and swelling. °? To relax your muscles. °· Shots (injections) of medicines that help to relieve pain, irritation, and swelling. °· Surgery. °Follow these instructions at home: °Medicines °· Take over-the-counter and prescription medicines only as told by your doctor. °· Ask your doctor if the medicine prescribed to you: °? Requires you to avoid driving or using heavy machinery. °? Can cause trouble pooping (constipation). You may need to take these steps to prevent or treat trouble pooping: °§ Drink enough fluids to keep your pee (urine) pale yellow. °§ Take over-the-counter or prescription medicines. °§ Eat foods that are high in fiber. These include beans, whole grains, and fresh fruits and vegetables. °§ Limit foods that are high in fat and sugar. These include fried or sweet foods. °Managing pain ° °  ° °· If told, put ice on the affected area. °? Put ice in a plastic bag. °? Place a towel between your skin and the bag. °? Leave the ice on for 20 minutes, 2-3 times a day. °· If told, put heat on the affected area. Use the heat source that your doctor tells you to use, such as a moist heat pack or a heating pad. °? Place a towel between your skin and the heat source. °? Leave the heat on for 20-30 minutes. °? Remove the heat if your skin turns bright red. This is very important if you are   unable to feel pain, heat, or cold. You may have a greater risk of getting burned. °Activity ° °· Return to your normal activities as told by your doctor. Ask your doctor what activities are safe for you. °· Avoid activities that make your symptoms worse. °· Take short rests during the day. °? When you rest for a long time, do some physical activity or stretching between periods of rest. °? Avoid sitting for a long time without moving. Get up and move around at least one time each hour. °· Exercise and stretch regularly, as told by your doctor. °· Do not lift anything that is heavier than 10 lb (4.5 kg)  while you have symptoms of sciatica. °? Avoid lifting heavy things even when you do not have symptoms. °? Avoid lifting heavy things over and over. °· When you lift objects, always lift in a way that is safe for your body. To do this, you should: °? Bend your knees. °? Keep the object close to your body. °? Avoid twisting. °General instructions °· Stay at a healthy weight. °· Wear comfortable shoes that support your feet. Avoid wearing high heels. °· Avoid sleeping on a mattress that is too soft or too hard. You might have less pain if you sleep on a mattress that is firm enough to support your back. °· Keep all follow-up visits as told by your doctor. This is important. °Contact a doctor if: °· You have pain that: °? Wakes you up when you are sleeping. °? Gets worse when you lie down. °? Is worse than the pain you have had in the past. °? Lasts longer than 4 weeks. °· You lose weight without trying. °Get help right away if: °· You cannot control when you pee (urinate) or poop (have a bowel movement). °· You have weakness in any of these areas and it gets worse: °? Lower back. °? The area between your hip bones. °? Butt. °? Legs. °· You have redness or swelling of your back. °· You have a burning feeling when you pee. °Summary °· Sciatica is pain, weakness, tingling, or loss of feeling (numbness) along the sciatic nerve. °· This condition happens when the sciatic nerve is pinched or has pressure put on it. °· Sciatica can cause pain, tingling, or loss of feeling (numbness) in the lower back, legs, hips, and butt. °· Treatment often includes rest, exercise, medicines, and putting ice or heat on the affected area. °This information is not intended to replace advice given to you by your health care provider. Make sure you discuss any questions you have with your health care provider. °Document Released: 12/05/2007 Document Revised: 03/16/2018 Document Reviewed: 03/16/2018 °Elsevier Patient Education © 2020 [employer] ° °

## 2018-11-22 NOTE — Progress Notes (Signed)
Subjective:  Patient ID: Deborah Knight, female    DOB: 02/18/1966,  MRN: [id number]  Chief Complaint  Patient presents with  . Foot Injury    Pt states history of fracture since February 2020, pain is mostly dorsal foot with some lateral aspect, worse with activity.    53 y.o. female presents with the above complaint. Hx as above. Previously saw Dr. Elijah Birkom was told she had a stress fracture. Unsure of what bone or area.  Review of Systems: Negative except as noted in the HPI. Denies N/V/F/Ch.  Past Medical History:  Diagnosis Date  . Anxiety   . Asthma   . HLD (hyperlipidemia)   . Hypertension   . Seasonal allergies     Current Outpatient Medications:  .  acetaminophen (TYLENOL) 500 MG tablet, Take 1 tablet (500 mg total) by mouth every 6 (six) hours as needed for moderate pain., Disp: 30 tablet, Rfl: 0 .  albuterol (VENTOLIN HFA) 108 (90 Base) MCG/ACT inhaler, Inhale 2 puffs into the lungs every 6 (six) hours as needed for wheezing. Shortness of breath, Disp: 6.7 g, Rfl: 5 .  amLODipine (NORVASC) 10 MG tablet, Take 1 tablet (10 mg total) by mouth daily., Disp: 30 tablet, Rfl: 5 .  cetirizine (ZYRTEC) 10 MG tablet, Take 1 tablet (10 mg total) by mouth daily., Disp: 30 tablet, Rfl: 11 .  citalopram (CELEXA) 20 MG tablet, Take 1 tablet (20 mg total) by mouth daily., Disp: 90 tablet, Rfl: 1 .  fluticasone (FLONASE) 50 MCG/ACT nasal spray, Place 2 sprays into both nostrils daily as needed (congestion)., Disp: 16 g, Rfl: 3 .  fluticasone-salmeterol (ADVAIR HFA) 115-21 MCG/ACT inhaler, Inhale 2 puffs into the lungs 2 (two) times daily., Disp: 1 Inhaler, Rfl: 11 .  gabapentin (NEURONTIN) 300 MG capsule, Take 1 capsule (300 mg total) by mouth 3 (three) times daily., Disp: 90 capsule, Rfl: 1 .  hydrochlorothiazide (HYDRODIURIL) 12.5 MG tablet, Take 1 tablet (12.5 mg total) by mouth daily., Disp: 30 tablet, Rfl: 5 .  HYDROcodone-acetaminophen (NORCO) 7.5-325 MG tablet, Take by mouth.,  Disp: , Rfl:  .  hydrOXYzine (ATARAX/VISTARIL) 25 MG tablet, Take by mouth., Disp: , Rfl:  .  loratadine (CLARITIN) 10 MG tablet, Take by mouth., Disp: , Rfl:  .  metoprolol tartrate (LOPRESSOR) 25 MG tablet, Take 0.5 tablets (12.5 mg total) by mouth 2 (two) times daily., Disp: 60 tablet, Rfl: 5 .  montelukast (SINGULAIR) 10 MG tablet, Take 1 tablet (10 mg total) by mouth at bedtime., Disp: 90 tablet, Rfl: 3 .  omeprazole (PRILOSEC) 20 MG capsule, Take 1 capsule (20 mg total) by mouth daily., Disp: 90 capsule, Rfl: 3 .  potassium chloride (K-DUR) 10 MEQ tablet, Take 1 tablet (10 mEq total) by mouth daily., Disp: 30 tablet, Rfl: 1 .  pravastatin (PRAVACHOL) 40 MG tablet, Take by mouth., Disp: , Rfl:  .  ranitidine (ZANTAC) 300 MG tablet, Take by mouth., Disp: , Rfl:  .  traMADol (ULTRAM) 50 MG tablet, Take 1 tablet (50 mg total) by mouth every 6 (six) hours as needed. (Patient not taking: Reported on 11/18/2018), Disp: 20 tablet, Rfl: 0 .  triamcinolone cream (KENALOG) 0.1 %, Apply 1 application topically 2 (two) times daily. (Patient not taking: Reported on 11/18/2018), Disp: 453.6 g, Rfl: 1 .  Vitamin D, Ergocalciferol, (DRISDOL) 1.25 MG (50000 UT) CAPS capsule, Take 1 capsule (50,000 Units total) by mouth every 7 (seven) days., Disp: 8 capsule, Rfl: 0 .  cyclobenzaprine (FLEXERIL) 10 MG tablet,  Take 1 tablet (10 mg total) by mouth at bedtime., Disp: 10 tablet, Rfl: 0 .  naproxen (NAPROSYN) 500 MG tablet, Take 1 tablet (500 mg total) by mouth 2 (two) times daily with a meal., Disp: 30 tablet, Rfl: 0  Social History   Tobacco Use  Smoking Status Former Smoker  . Types: Cigars  . Quit date: 11/09/2015  . Years since quitting: 3.0  Smokeless Tobacco Current User  Tobacco Comment   vapor     Allergies  Allergen Reactions  . Losartan Swelling    Lip swelling  . Shellfish-Derived Products     Other reaction(s): Angioedema (ALLERGY/intolerance)  . Toradol [Ketorolac Tromethamine] Swelling     Lip swelling  . Latex Rash   Objective:   Vitals:   10/29/18 0946  Temp: 98.4 F (36.9 C)   There is no height or weight on file to calculate BMI. Constitutional Well developed. Well nourished.  Vascular Dorsalis pedis pulses palpable bilaterally. Posterior tibial pulses palpable bilaterally. Capillary refill normal to all digits.  No cyanosis or clubbing noted. Pedal hair growth normal.  Neurologic Normal speech. Oriented to person, place, and time. Epicritic sensation to light touch grossly present bilaterally.  Dermatologic Nails well groomed and normal in appearance. No open wounds. No skin lesions.  Orthopedic: POP dorsal midfoot with prominent osteophytes   Radiographs: dorsal midfoot arhtrosis, no evidence of stress fracture. No acute fracture. Assessment:   1. Arthritis of midfoot    Plan:  Patient was evaluated and treated and all questions answered.  Midfoot arthritis -XR reviewed as above -Discussed with patient I do not see evidence of prior stress fracture -Injection delivered as below  Procedure: Joint Injection Location: Right dorsal TMTs joint Skin Prep: Alcohol. Injectate: 0.5 cc 1% lidocaine plain, 0.5 cc dexamethasone phosphate. Disposition: Patient tolerated procedure well. Injection site dressed with a band-aid.  Return in about 6 weeks (around 12/10/2018) for Midfoot arthritis f/u right .

## 2018-12-10 ENCOUNTER — Encounter: Payer: Self-pay | Admitting: Podiatry

## 2018-12-10 ENCOUNTER — Ambulatory Visit: Payer: Self-pay | Admitting: Podiatry

## 2018-12-10 ENCOUNTER — Other Ambulatory Visit: Payer: Self-pay

## 2018-12-10 DIAGNOSIS — M19079 Primary osteoarthritis, unspecified ankle and foot: Secondary | ICD-10-CM

## 2018-12-10 NOTE — Progress Notes (Signed)
Subjective:  Patient ID: Deborah Knight, female    DOB: Mar 23, 1965,  MRN: [id number]  Chief Complaint  Patient presents with  . Arthritis    Pt states her right foot is still having the same pain and swelling as previous visit. Pt states the injection was not effective at treating her symptoms and that the boot is only effective for a short amount of time before she is in pain.    53 y.o. female presents with the above complaint. Hx as above. States the injection did help for about a week.  Review of Systems: Negative except as noted in the HPI. Denies N/V/F/Ch.  Past Medical History:  Diagnosis Date  . Anxiety   . Asthma   . HLD (hyperlipidemia)   . Hypertension   . Seasonal allergies     Current Outpatient Medications:  .  acetaminophen (TYLENOL) 500 MG tablet, Take 1 tablet (500 mg total) by mouth every 6 (six) hours as needed for moderate pain., Disp: 30 tablet, Rfl: 0 .  albuterol (VENTOLIN HFA) 108 (90 Base) MCG/ACT inhaler, Inhale 2 puffs into the lungs every 6 (six) hours as needed for wheezing. Shortness of breath, Disp: 6.7 g, Rfl: 5 .  amLODipine (NORVASC) 10 MG tablet, Take 1 tablet (10 mg total) by mouth daily., Disp: 30 tablet, Rfl: 5 .  cetirizine (ZYRTEC) 10 MG tablet, Take 1 tablet (10 mg total) by mouth daily., Disp: 30 tablet, Rfl: 11 .  citalopram (CELEXA) 20 MG tablet, Take 1 tablet (20 mg total) by mouth daily., Disp: 90 tablet, Rfl: 1 .  cyclobenzaprine (FLEXERIL) 10 MG tablet, Take 1 tablet (10 mg total) by mouth at bedtime., Disp: 10 tablet, Rfl: 0 .  fluticasone (FLONASE) 50 MCG/ACT nasal spray, Place 2 sprays into both nostrils daily as needed (congestion)., Disp: 16 g, Rfl: 3 .  fluticasone-salmeterol (ADVAIR HFA) 115-21 MCG/ACT inhaler, Inhale 2 puffs into the lungs 2 (two) times daily., Disp: 1 Inhaler, Rfl: 11 .  gabapentin (NEURONTIN) 300 MG capsule, Take 1 capsule (300 mg total) by mouth 3 (three) times daily., Disp: 90 capsule, Rfl: 1 .   hydrochlorothiazide (HYDRODIURIL) 12.5 MG tablet, Take 1 tablet (12.5 mg total) by mouth daily., Disp: 30 tablet, Rfl: 5 .  HYDROcodone-acetaminophen (NORCO) 7.5-325 MG tablet, Take by mouth., Disp: , Rfl:  .  hydrOXYzine (ATARAX/VISTARIL) 25 MG tablet, Take by mouth., Disp: , Rfl:  .  loratadine (CLARITIN) 10 MG tablet, Take by mouth., Disp: , Rfl:  .  metoprolol tartrate (LOPRESSOR) 25 MG tablet, Take 0.5 tablets (12.5 mg total) by mouth 2 (two) times daily., Disp: 60 tablet, Rfl: 5 .  montelukast (SINGULAIR) 10 MG tablet, Take 1 tablet (10 mg total) by mouth at bedtime., Disp: 90 tablet, Rfl: 3 .  naproxen (NAPROSYN) 500 MG tablet, Take 1 tablet (500 mg total) by mouth 2 (two) times daily with a meal., Disp: 30 tablet, Rfl: 0 .  omeprazole (PRILOSEC) 20 MG capsule, Take 1 capsule (20 mg total) by mouth daily., Disp: 90 capsule, Rfl: 3 .  potassium chloride (K-DUR) 10 MEQ tablet, Take 1 tablet (10 mEq total) by mouth daily., Disp: 30 tablet, Rfl: 1 .  pravastatin (PRAVACHOL) 40 MG tablet, Take by mouth., Disp: , Rfl:  .  ranitidine (ZANTAC) 300 MG tablet, Take by mouth., Disp: , Rfl:  .  traMADol (ULTRAM) 50 MG tablet, Take 1 tablet (50 mg total) by mouth every 6 (six) hours as needed., Disp: 20 tablet, Rfl: 0 .  triamcinolone  cream (KENALOG) 0.1 %, Apply 1 application topically 2 (two) times daily., Disp: 453.6 g, Rfl: 1 .  Vitamin D, Ergocalciferol, (DRISDOL) 1.25 MG (50000 UT) CAPS capsule, Take 1 capsule (50,000 Units total) by mouth every 7 (seven) days., Disp: 8 capsule, Rfl: 0  Social History   Tobacco Use  Smoking Status Former Smoker  . Types: Cigars  . Quit date: 11/09/2015  . Years since quitting: 3.0  Smokeless Tobacco Current User  Tobacco Comment   vapor     Allergies  Allergen Reactions  . Losartan Swelling    Lip swelling  . Shellfish-Derived Products     Other reaction(s): Angioedema (ALLERGY/intolerance)  . Toradol [Ketorolac Tromethamine] Swelling    Lip swelling   . Latex Rash   Objective:   There were no vitals filed for this visit. There is no height or weight on file to calculate BMI. Constitutional Well developed. Well nourished.  Vascular Dorsalis pedis pulses palpable bilaterally. Posterior tibial pulses palpable bilaterally. Capillary refill normal to all digits.  No cyanosis or clubbing noted. Pedal hair growth normal.  Neurologic Normal speech. Oriented to person, place, and time. Epicritic sensation to light touch grossly present bilaterally.  Dermatologic Nails well groomed and normal in appearance. No open wounds. No skin lesions.  Orthopedic: POP dorsal midfoot with prominent osteophytes   Radiographs: none Assessment:   1. Arthritis of midfoot    Plan:  Patient was evaluated and treated and all questions answered.  Midfoot arthritis -Repeat injection as below  Procedure: Joint Injection Location: Right dorsal 2nd TMT joint Skin Prep: Alcohol. Injectate: 0.5 cc 1% lidocaine plain, 1 cc dexamethasone phosphate. Disposition: Patient tolerated procedure well. Injection site dressed with a band-aid.   No follow-ups on file.

## 2018-12-21 ENCOUNTER — Telehealth: Payer: Self-pay | Admitting: Podiatry

## 2018-12-23 NOTE — Telephone Encounter (Signed)
I'm calling about the request for medical records sent on 01 October. I'm just checking on the process status.

## 2018-12-23 NOTE — Telephone Encounter (Signed)
Called and left Deborah Knight a voicemail that those medical records had be faxed on Monday, 12/21/2018 that he requested on 12/10/2018. Told him to call me back if he did not receive the fax or needed anything else.

## 2018-12-28 ENCOUNTER — Encounter: Payer: Self-pay | Admitting: Gastroenterology

## 2018-12-31 ENCOUNTER — Telehealth (HOSPITAL_COMMUNITY): Payer: Self-pay

## 2018-12-31 ENCOUNTER — Telehealth: Payer: Self-pay | Admitting: Internal Medicine

## 2018-12-31 NOTE — Telephone Encounter (Signed)
Left message with patient about upcoming [facility] appointment. Patient is also scheduled for a colonoscopy on this day. Left name and number for patient to call back.

## 2019-01-05 ENCOUNTER — Ambulatory Visit (HOSPITAL_COMMUNITY): Payer: Self-pay

## 2019-01-07 NOTE — Telephone Encounter (Signed)
I got intouch with patient

## 2019-01-14 ENCOUNTER — Ambulatory Visit: Payer: Self-pay | Admitting: Family Medicine

## 2019-01-19 ENCOUNTER — Encounter: Payer: Self-pay | Admitting: Gastroenterology

## 2019-01-21 ENCOUNTER — Encounter: Payer: Self-pay | Admitting: Podiatry

## 2019-01-21 ENCOUNTER — Other Ambulatory Visit: Payer: Self-pay

## 2019-01-21 ENCOUNTER — Ambulatory Visit (INDEPENDENT_AMBULATORY_CARE_PROVIDER_SITE_OTHER): Payer: Self-pay | Admitting: Podiatry

## 2019-01-21 DIAGNOSIS — M19079 Primary osteoarthritis, unspecified ankle and foot: Secondary | ICD-10-CM

## 2019-01-21 NOTE — Progress Notes (Signed)
Subjective:  Patient ID: Deborah Knight, female    DOB: 04-Jan-1966,  MRN: [id number]  Chief Complaint  Patient presents with  . Foot Pain    pt is here for midfoot arthritis f/u, pt states that she is still in pain, and that the previous injections she has recieved has not been helping as well, pt also states that she is still in excrutiating pain as well    53 y.o. female presents with the above complaint. Hx as above.    Review of Systems: Negative except as noted in the HPI. Denies N/V/F/Ch.  Past Medical History:  Diagnosis Date  . Anxiety   . Asthma   . HLD (hyperlipidemia)   . Hypertension   . Seasonal allergies     Current Outpatient Medications:  .  acetaminophen (TYLENOL) 500 MG tablet, Take 1 tablet (500 mg total) by mouth every 6 (six) hours as needed for moderate pain., Disp: 30 tablet, Rfl: 0 .  albuterol (VENTOLIN HFA) 108 (90 Base) MCG/ACT inhaler, Inhale 2 puffs into the lungs every 6 (six) hours as needed for wheezing. Shortness of breath, Disp: 6.7 g, Rfl: 5 .  amLODipine (NORVASC) 10 MG tablet, Take 1 tablet (10 mg total) by mouth daily., Disp: 30 tablet, Rfl: 5 .  cetirizine (ZYRTEC) 10 MG tablet, Take 1 tablet (10 mg total) by mouth daily., Disp: 30 tablet, Rfl: 11 .  citalopram (CELEXA) 20 MG tablet, Take 1 tablet (20 mg total) by mouth daily., Disp: 90 tablet, Rfl: 1 .  cyclobenzaprine (FLEXERIL) 10 MG tablet, Take 1 tablet (10 mg total) by mouth at bedtime., Disp: 10 tablet, Rfl: 0 .  fluticasone (FLONASE) 50 MCG/ACT nasal spray, Place 2 sprays into both nostrils daily as needed (congestion)., Disp: 16 g, Rfl: 3 .  fluticasone-salmeterol (ADVAIR HFA) 115-21 MCG/ACT inhaler, Inhale 2 puffs into the lungs 2 (two) times daily., Disp: 1 Inhaler, Rfl: 11 .  gabapentin (NEURONTIN) 300 MG capsule, Take 1 capsule (300 mg total) by mouth 3 (three) times daily., Disp: 90 capsule, Rfl: 1 .  hydrochlorothiazide (HYDRODIURIL) 12.5 MG tablet, Take 1 tablet (12.5 mg  total) by mouth daily., Disp: 30 tablet, Rfl: 5 .  HYDROcodone-acetaminophen (NORCO) 7.5-325 MG tablet, Take by mouth., Disp: , Rfl:  .  hydrOXYzine (ATARAX/VISTARIL) 25 MG tablet, Take by mouth., Disp: , Rfl:  .  loratadine (CLARITIN) 10 MG tablet, Take by mouth., Disp: , Rfl:  .  metoprolol tartrate (LOPRESSOR) 25 MG tablet, Take 0.5 tablets (12.5 mg total) by mouth 2 (two) times daily., Disp: 60 tablet, Rfl: 5 .  montelukast (SINGULAIR) 10 MG tablet, Take 1 tablet (10 mg total) by mouth at bedtime., Disp: 90 tablet, Rfl: 3 .  naproxen (NAPROSYN) 500 MG tablet, Take 1 tablet (500 mg total) by mouth 2 (two) times daily with a meal., Disp: 30 tablet, Rfl: 0 .  omeprazole (PRILOSEC) 20 MG capsule, Take 1 capsule (20 mg total) by mouth daily., Disp: 90 capsule, Rfl: 3 .  potassium chloride (K-DUR) 10 MEQ tablet, Take 1 tablet (10 mEq total) by mouth daily., Disp: 30 tablet, Rfl: 1 .  pravastatin (PRAVACHOL) 40 MG tablet, Take by mouth., Disp: , Rfl:  .  ranitidine (ZANTAC) 300 MG tablet, Take by mouth., Disp: , Rfl:  .  traMADol (ULTRAM) 50 MG tablet, Take 1 tablet (50 mg total) by mouth every 6 (six) hours as needed., Disp: 20 tablet, Rfl: 0 .  triamcinolone cream (KENALOG) 0.1 %, Apply 1 application topically 2 (two)  times daily., Disp: 453.6 g, Rfl: 1  Social History   Tobacco Use  Smoking Status Former Smoker  . Types: Cigars  . Quit date: 11/09/2015  . Years since quitting: 3.2  Smokeless Tobacco Current User  Tobacco Comment   vapor     Allergies  Allergen Reactions  . Losartan Swelling    Lip swelling  . Shellfish-Derived Products     Other reaction(s): Angioedema (ALLERGY/intolerance)  . Toradol [Ketorolac Tromethamine] Swelling    Lip swelling  . Latex Rash   Objective:   There were no vitals filed for this visit. There is no height or weight on file to calculate BMI. Constitutional Well developed. Well nourished.  Vascular Dorsalis pedis pulses palpable bilaterally.  Posterior tibial pulses palpable bilaterally. Capillary refill normal to all digits.  No cyanosis or clubbing noted. Pedal hair growth normal.  Neurologic Normal speech. Oriented to person, place, and time. Epicritic sensation to light touch grossly present bilaterally.  Dermatologic Nails well groomed and normal in appearance. No open wounds. No skin lesions.  Orthopedic: POP dorsal midfoot with prominent osteophytes   Radiographs: none Assessment:   1. Arthritis of midfoot    Plan:  Patient was evaluated and treated and all questions answered.  Midfoot arthritis -Final injection as below -Could possibly benefit from elective spur excision should pain persist.  Procedure: Joint Injection Location: Right dorsal TMTs joint Skin Prep: Alcohol. Injectate: 0.5 cc 1% lidocaine plain, 0.5 cc dexamethasone phosphate. Disposition: Patient tolerated procedure well. Injection site dressed with a band-aid.   Return in about 6 weeks (around 03/04/2019) for midfoot arthritis, Right.

## 2019-02-16 ENCOUNTER — Encounter: Payer: Self-pay | Admitting: Gastroenterology

## 2019-02-23 ENCOUNTER — Other Ambulatory Visit: Payer: Self-pay

## 2019-02-23 ENCOUNTER — Ambulatory Visit (INDEPENDENT_AMBULATORY_CARE_PROVIDER_SITE_OTHER): Payer: Self-pay | Admitting: Family Medicine

## 2019-02-23 ENCOUNTER — Encounter: Payer: Self-pay | Admitting: Family Medicine

## 2019-02-23 VITALS — BP 150/94 | HR 78 | Temp 98.3°F | Resp 16 | Ht 62.0 in | Wt 235.0 lb

## 2019-02-23 DIAGNOSIS — M5431 Sciatica, right side: Secondary | ICD-10-CM

## 2019-02-23 DIAGNOSIS — M545 Low back pain, unspecified: Secondary | ICD-10-CM

## 2019-02-23 DIAGNOSIS — F329 Major depressive disorder, single episode, unspecified: Secondary | ICD-10-CM

## 2019-02-23 DIAGNOSIS — I10 Essential (primary) hypertension: Secondary | ICD-10-CM

## 2019-02-23 DIAGNOSIS — G894 Chronic pain syndrome: Secondary | ICD-10-CM

## 2019-02-23 DIAGNOSIS — G8929 Other chronic pain: Secondary | ICD-10-CM

## 2019-02-23 DIAGNOSIS — J302 Other seasonal allergic rhinitis: Secondary | ICD-10-CM

## 2019-02-23 DIAGNOSIS — F32A Depression, unspecified: Secondary | ICD-10-CM

## 2019-02-23 DIAGNOSIS — F411 Generalized anxiety disorder: Secondary | ICD-10-CM

## 2019-02-23 DIAGNOSIS — Z720 Tobacco use: Secondary | ICD-10-CM

## 2019-02-23 DIAGNOSIS — F419 Anxiety disorder, unspecified: Secondary | ICD-10-CM

## 2019-02-23 DIAGNOSIS — J4520 Mild intermittent asthma, uncomplicated: Secondary | ICD-10-CM

## 2019-02-23 LAB — POCT URINALYSIS DIPSTICK
Bilirubin, UA: NEGATIVE
Blood, UA: NEGATIVE
Glucose, UA: NEGATIVE
Ketones, UA: NEGATIVE
Leukocytes, UA: NEGATIVE
Nitrite, UA: NEGATIVE
Protein, UA: NEGATIVE
Spec Grav, UA: 1.02 (ref 1.010–1.025)
Urobilinogen, UA: 0.2 E.U./dL
pH, UA: 7 (ref 5.0–8.0)

## 2019-02-23 MED ORDER — METHYLPREDNISOLONE SODIUM SUCC 125 MG IJ SOLR
125.0000 mg | Freq: Once | INTRAMUSCULAR | Status: AC
  Administered 2019-02-23: 125 mg via INTRAMUSCULAR

## 2019-02-23 MED ORDER — AMLODIPINE BESYLATE 10 MG PO TABS: 10.0000 mg | ORAL_TABLET | Freq: Every day | ORAL | 5 refills | Status: DC

## 2019-02-23 MED ORDER — BUSPIRONE HCL 10 MG PO TABS: 10.0000 mg | ORAL_TABLET | Freq: Two times a day (BID) | ORAL | 5 refills | Status: DC

## 2019-02-23 MED ORDER — CYCLOBENZAPRINE HCL 10 MG PO TABS: 10.0000 mg | ORAL_TABLET | Freq: Every day | ORAL | 0 refills | Status: DC

## 2019-02-23 MED ORDER — ALPRAZOLAM 0.25 MG PO TABS: 0.2500 mg | ORAL_TABLET | Freq: Two times a day (BID) | ORAL | 0 refills | Status: DC | PRN

## 2019-02-23 MED ORDER — HYDROCHLOROTHIAZIDE 12.5 MG PO TABS: 12.5000 mg | ORAL_TABLET | Freq: Every day | ORAL | 5 refills | Status: DC

## 2019-02-23 MED FILL — CYCLOBENZAPRINE 10 MG TAB: 10 | 20 days supply | Qty: 20 | Fill #0

## 2019-02-23 MED FILL — ?BUSPIRONE HCL 10 MG TABS: 10 | 30 days supply | Qty: 60 | Fill #0

## 2019-02-23 NOTE — Patient Instructions (Signed)
Generalized Anxiety Disorder, Adult Generalized anxiety disorder (GAD) is a mental health disorder. People with this condition constantly worry about everyday events. Unlike normal anxiety, worry related to GAD is not triggered by a specific event. These worries also do not fade or get better with time. GAD interferes with life functions, including relationships, work, and school. GAD can vary from mild to severe. People with severe GAD can have intense waves of anxiety with physical symptoms (panic attacks). What are the causes? The exact cause of GAD is not known. What increases the risk? This condition is more likely to develop in:  Women.  People who have a family history of anxiety disorders.  People who are very shy.  People who experience very stressful life events, such as the death of a loved one.  People who have a very stressful family environment. What are the signs or symptoms? People with GAD often worry excessively about many things in their lives, such as their health and family. They may also be overly concerned about:  Doing well at work.  Being on time.  Natural disasters.  Friendships. Physical symptoms of GAD include:  Fatigue.  Muscle tension or having muscle twitches.  Trembling or feeling shaky.  Being easily startled.  Feeling like your heart is pounding or racing.  Feeling out of breath or like you cannot take a deep breath.  Having trouble falling asleep or staying asleep.  Sweating.  Nausea, diarrhea, or irritable bowel syndrome (IBS).  Headaches.  Trouble concentrating or remembering facts.  Restlessness.  Irritability. How is this diagnosed? Your health care provider can diagnose GAD based on your symptoms and medical history. You will also have a physical exam. The health care provider will ask specific questions about your symptoms, including how severe they are, when they started, and if they come and go. Your health care  provider may ask you about your use of alcohol or drugs, including prescription medicines. Your health care provider may refer you to a mental health specialist for further evaluation. Your health care provider will do a thorough examination and may perform additional tests to rule out other possible causes of your symptoms. To be diagnosed with GAD, a person must have anxiety that:  Is out of his or her control.  Affects several different aspects of his or her life, such as work and relationships.  Causes distress that makes him or her unable to take part in normal activities.  Includes at least three physical symptoms of GAD, such as restlessness, fatigue, trouble concentrating, irritability, muscle tension, or sleep problems. Before your health care provider can confirm a diagnosis of GAD, these symptoms must be present more days than they are not, and they must last for six months or longer. How is this treated? The following therapies are usually used to treat GAD:  Medicine. Antidepressant medicine is usually prescribed for long-term daily control. Antianxiety medicines may be added in severe cases, especially when panic attacks occur.  Talk therapy (psychotherapy). Certain types of talk therapy can be helpful in treating GAD by providing support, education, and guidance. Options include: ? Cognitive behavioral therapy (CBT). People learn coping skills and techniques to ease their anxiety. They learn to identify unrealistic or negative thoughts and behaviors and to replace them with positive ones. ? Acceptance and commitment therapy (ACT). This treatment teaches people how to be mindful as a way to cope with unwanted thoughts and feelings. ? Biofeedback. This process trains you to manage your body's response (  physiological response) through breathing techniques and relaxation methods. You will work with a therapist while machines are used to monitor your physical symptoms.  Stress  management techniques. These include yoga, meditation, and exercise. A mental health specialist can help determine which treatment is best for you. Some people see improvement with one type of therapy. However, other people require a combination of therapies. Follow these instructions at home:  Take over-the-counter and prescription medicines only as told by your health care provider.  Try to maintain a normal routine.  Try to anticipate stressful situations and allow extra time to manage them.  Practice any stress management or self-calming techniques as taught by your health care provider.  Do not punish yourself for setbacks or for not making progress.  Try to recognize your accomplishments, even if they are small.  Keep all follow-up visits as told by your health care provider. This is important. Contact a health care provider if:  Your symptoms do not get better.  Your symptoms get worse.  You have signs of depression, such as: ? A persistently sad, cranky, or irritable mood. ? Loss of enjoyment in activities that used to bring you joy. ? Change in weight or eating. ? Changes in sleeping habits. ? Avoiding friends or family members. ? Loss of energy for normal tasks. ? Feelings of guilt or worthlessness. Get help right away if:  You have serious thoughts about hurting yourself or others. If you ever feel like you may hurt yourself or others, or have thoughts about taking your own life, get help right away. You can go to your nearest emergency department or call:  Your local emergency services (911 in the [place]  A suicide crisis helpline, such as the National Suicide Prevention Lifeline at (647)430-8449. This is open 24 hours a day. Summary  Generalized anxiety disorder (GAD) is a mental health disorder that involves worry that is not triggered by a specific event.  People with GAD often worry excessively about many things in their lives, such as their health and  family.  GAD may cause physical symptoms such as restlessness, trouble concentrating, sleep problems, frequent sweating, nausea, diarrhea, headaches, and trembling or muscle twitching.  A mental health specialist can help determine which treatment is best for you. Some people see improvement with one type of therapy. However, other people require a combination of therapies. This information is not intended to replace advice given to you by your health care provider. Make sure you discuss any questions you have with your health care provider. Document Released: 06/22/2012 Document Revised: 02/07/2017 Document Reviewed: 01/16/2016 Elsevier Patient Education  2020 [employer] Chronic Back Pain When back pain lasts longer than 3 months, it is called chronic back pain. Pain may get worse at certain times (flare-ups). There are things you can do at home to manage your pain. Follow these instructions at home: Activity      Avoid bending and other activities that make pain worse.  When standing: ? Keep your upper back and neck straight. ? Keep your shoulders pulled back. ? Avoid slouching.  When sitting: ? Keep your back straight. ? Relax your shoulders. Do not round your shoulders or pull them backward.  Do not sit or stand in one place for long periods of time.  Take short rest breaks during the day. Lying down or standing is usually better than sitting. Resting can help relieve pain.  When sitting or lying down for a long time, do some mild activity or  stretching. This will help to prevent stiffness and pain.  Get regular exercise. Ask your doctor what activities are safe for you.  Do not lift anything that is heavier than 10 lb (4.5 kg). To prevent injury when you lift things: ? Bend your knees. ? Keep the weight close to your body. ? Avoid twisting. Managing pain  If told, put ice on the painful area. Your doctor may tell you to use ice for 24-48 hours after a flare-up  starts. ? Put ice in a plastic bag. ? Place a towel between your skin and the bag. ? Leave the ice on for 20 minutes, 2-3 times a day.  If told, put heat on the painful area as often as told by your doctor. Use the heat source that your doctor recommends, such as a moist heat pack or a heating pad. ? Place a towel between your skin and the heat source. ? Leave the heat on for 20-30 minutes. ? Remove the heat if your skin turns bright red. This is especially important if you are unable to feel pain, heat, or cold. You may have a greater risk of getting burned.  Soak in a warm bath. This can help relieve pain.  Take over-the-counter and prescription medicines only as told by your doctor. General instructions  Sleep on a firm mattress. Try lying on your side with your knees slightly bent. If you lie on your back, put a pillow under your knees.  Keep all follow-up visits as told by your doctor. This is important. Contact a doctor if:  You have pain that does not get better with rest or medicine. Get help right away if:  One or both of your arms or legs feel weak.  One or both of your arms or legs lose feeling (numbness).  You have trouble controlling when you poop (bowel movement) or pee (urinate).  You feel sick to your stomach (nauseous).  You throw up (vomit).  You have belly (abdominal) pain.  You have shortness of breath.  You pass out (faint). Summary  When back pain lasts longer than 3 months, it is called chronic back pain.  Pain may get worse at certain times (flare-ups).  Use ice and heat as told by your doctor. Your doctor may tell you to use ice after flare-ups. This information is not intended to replace advice given to you by your health care provider. Make sure you discuss any questions you have with your health care provider. Document Released: 08/14/2007 Document Revised: 06/18/2018 Document Reviewed: 10/10/2016 Elsevier Patient Education  2020 [employer].

## 2019-02-23 NOTE — Progress Notes (Signed)
[facility]   Established Patient Office Visit  Subjective:  Patient ID: Deborah Knight, female    DOB: 07/08/1965  Age: 53 y.o. MRN: [id number]  CC:  Chief Complaint  Patient presents with  . Hypertension  . Back Pain    wants referral    Deborah LofflerLinda Knight, a 53 year old female with a medical history significant for hypertension, obesity, chronic back pain, severe asthma, and generalized anxiety disorder presents for a follow up of chronic conditions.  Patient is primarily complaining of increased anxiety. Patient has a difficult time getting through the holidays since the death of her son. She says that she has been worrying, unable to control her thoughts or feelings, and she has had feelings of panic. She denies suicidal or homicidal ideations. She also says that feelings of depression have not been controlled on celexa. Patient is not currently under the care of psychiatry. She has been lost to follow up for greater than a year.   Hypertension This is a chronic problem. The current episode started more than 1 year ago. The problem has been gradually improving since onset. The problem is controlled. Associated symptoms include anxiety and shortness of breath. Pertinent negatives include no headaches or peripheral edema.  Back Pain This is a chronic problem. The current episode started more than 1 year ago. The problem occurs daily. The pain is present in the gluteal and lumbar spine. The pain is at a severity of 5/10. The pain is moderate. The pain is worse during the day. The symptoms are aggravated by bending and lying down. Pertinent negatives include no abdominal pain, bladder incontinence, bowel incontinence, headaches, leg pain, pelvic pain or perianal numbness. Risk factors include lack of exercise, obesity, poor posture and sedentary lifestyle. She has tried analgesics for the symptoms. The treatment provided mild relief.   Anxiety Presents for follow-up visit. Symptoms include decreased concentration, irritability, nervous/anxious behavior, panic, restlessness and shortness of breath. Patient reports no suicidal ideas. Symptoms occur most days. The severity of symptoms is moderate. The quality of sleep is poor. Nighttime awakenings: occasional.       Past Medical History:  Diagnosis Date  . Anxiety   . Asthma   . HLD (hyperlipidemia)   . Hypertension   . Seasonal allergies     Past Surgical History:  Procedure Laterality Date  . CESAREAN SECTION  1997  . HERNIA REPAIR  1989  . SHOULDER ARTHROSCOPY    . TUBAL LIGATION  1997    Family History  Problem Relation Age of Onset  . Asthma Father   . Cancer Father   . Asthma Sister   . Asthma Brother   . Asthma Maternal Grandmother   . Asthma Maternal Grandfather   . Asthma Paternal Grandmother   . Asthma Paternal Grandfather   . Hyperlipidemia Mother   . Hyperlipidemia Sister     Social History   Socioeconomic History  . Marital status: Divorced    Spouse name: Not on file  . Number of children: Not on file  . Years of education: Not on file  . Highest education level: Not on file  Occupational History  . Not on file  Tobacco Use  . Smoking status: Former Smoker    Types: Cigars    Quit date: 11/09/2015    Years since quitting: 3.2  . Smokeless tobacco: Current User  . Tobacco comment: vapor   Substance and Sexual Activity  . Alcohol use:  No  . Drug use: No  . Sexual activity: Not Currently    Birth control/protection: Condom, None  Other Topics Concern  . Not on file  Social History Narrative   Employed as a Education officer, environmental.   Social Determinants of Health   Financial Resource Strain:   . Difficulty of Paying Living Expenses: Not on file  Food Insecurity:   . Worried About Programme researcher, broadcasting/film/video in the Last Year: Not on file  . Ran Out of Food in the Last Year: Not on file  Transportation Needs:   . Lack of  Transportation (Medical): Not on file  . Lack of Transportation (Non-Medical): Not on file  Physical Activity:   . Days of Exercise per Week: Not on file  . Minutes of Exercise per Session: Not on file  Stress:   . Feeling of Stress : Not on file  Social Connections:   . Frequency of Communication with Friends and Family: Not on file  . Frequency of Social Gatherings with Friends and Family: Not on file  . Attends Religious Services: Not on file  . Active Member of Clubs or Organizations: Not on file  . Attends Banker Meetings: Not on file  . Marital Status: Not on file  Intimate Partner Violence:   . Fear of Current or Ex-Partner: Not on file  . Emotionally Abused: Not on file  . Physically Abused: Not on file  . Sexually Abused: Not on file    Outpatient Medications Prior to Visit  Medication Sig Dispense Refill  . acetaminophen (TYLENOL) 500 MG tablet Take 1 tablet (500 mg total) by mouth every 6 (six) hours as needed for moderate pain. 30 tablet 0  . albuterol (VENTOLIN HFA) 108 (90 Base) MCG/ACT inhaler Inhale 2 puffs into the lungs every 6 (six) hours as needed for wheezing. Shortness of breath 6.7 g 5  . amLODipine (NORVASC) 10 MG tablet Take 1 tablet (10 mg total) by mouth daily. 30 tablet 5  . cetirizine (ZYRTEC) 10 MG tablet Take 1 tablet (10 mg total) by mouth daily. 30 tablet 11  . citalopram (CELEXA) 20 MG tablet Take 1 tablet (20 mg total) by mouth daily. 90 tablet 1  . cyclobenzaprine (FLEXERIL) 10 MG tablet Take 1 tablet (10 mg total) by mouth at bedtime. 10 tablet 0  . fluticasone (FLONASE) 50 MCG/ACT nasal spray Place 2 sprays into both nostrils daily as needed (congestion). 16 g 3  . fluticasone-salmeterol (ADVAIR HFA) 115-21 MCG/ACT inhaler Inhale 2 puffs into the lungs 2 (two) times daily. 1 Inhaler 11  . gabapentin (NEURONTIN) 300 MG capsule Take 1 capsule (300 mg total) by mouth 3 (three) times daily. 90 capsule 1  . hydrochlorothiazide  (HYDRODIURIL) 12.5 MG tablet Take 1 tablet (12.5 mg total) by mouth daily. 30 tablet 5  . hydrOXYzine (ATARAX/VISTARIL) 25 MG tablet Take by mouth.    . loratadine (CLARITIN) 10 MG tablet Take by mouth.    . metoprolol tartrate (LOPRESSOR) 25 MG tablet Take 0.5 tablets (12.5 mg total) by mouth 2 (two) times daily. 60 tablet 5  . montelukast (SINGULAIR) 10 MG tablet Take 1 tablet (10 mg total) by mouth at bedtime. 90 tablet 3  . naproxen (NAPROSYN) 500 MG tablet Take 1 tablet (500 mg total) by mouth 2 (two) times daily with a meal. 30 tablet 0  . omeprazole (PRILOSEC) 20 MG capsule Take 1 capsule (20 mg total) by mouth daily. 90 capsule 3  . potassium chloride (K-DUR)  10 MEQ tablet Take 1 tablet (10 mEq total) by mouth daily. 30 tablet 1  . pravastatin (PRAVACHOL) 40 MG tablet Take by mouth.    . triamcinolone cream (KENALOG) 0.1 % Apply 1 application topically 2 (two) times daily. 453.6 g 1  . ranitidine (ZANTAC) 300 MG tablet Take by mouth.    . traMADol (ULTRAM) 50 MG tablet Take 1 tablet (50 mg total) by mouth every 6 (six) hours as needed. (Patient not taking: Reported on 02/23/2019) 20 tablet 0  . HYDROcodone-acetaminophen (NORCO) 7.5-325 MG tablet Take by mouth.     No facility-administered medications prior to visit.    Allergies  Allergen Reactions  . Losartan Swelling    Lip swelling  . Shellfish-Derived Products     Other reaction(s): Angioedema (ALLERGY/intolerance)  . Toradol [Ketorolac Tromethamine] Swelling    Lip swelling  . Latex Rash    ROS Review of Systems  Constitutional: Positive for irritability. Negative for activity change and appetite change.  HENT: Negative.  Negative for congestion and dental problem.   Eyes: Negative.   Respiratory: Positive for cough, shortness of breath and wheezing. Negative for choking and chest tightness.   Cardiovascular: Negative.   Gastrointestinal: Negative for abdominal pain and bowel incontinence.  Endocrine: Negative for  polydipsia, polyphagia and polyuria.  Genitourinary: Negative.  Negative for bladder incontinence and pelvic pain.  Musculoskeletal: Positive for back pain.  Skin: Negative.   Neurological: Negative.  Negative for headaches.  Psychiatric/Behavioral: Positive for decreased concentration. Negative for suicidal ideas. The patient is nervous/anxious.        Objective:    Physical Exam  Constitutional: She is oriented to person, place, and time. She appears well-developed and well-nourished.  HENT:  Head: Normocephalic and atraumatic.  Eyes: Pupils are equal, round, and reactive to light.  Cardiovascular: Normal rate and regular rhythm.  Pulmonary/Chest: Effort normal and breath sounds normal.  Abdominal: Soft. Bowel sounds are normal.  Musculoskeletal:        General: Normal range of motion.  Neurological: She is alert and oriented to person, place, and time.  Skin: Skin is warm and dry.  Psychiatric: Her behavior is normal. Judgment and thought content normal. Her mood appears anxious. She exhibits a depressed mood.    BP (!) 150/94 (BP Location: Right Arm, Patient Position: Sitting, Cuff Size: Normal) Comment: manually  Pulse 78   Temp 98.3 F (36.8 C) (Oral)   Resp 16   Ht  (1.575 m)   Wt 235 lb (106.6 kg)   LMP 08/13/2012   SpO2 99%   BMI 42.98 kg/m  Wt Readings from Last 3 Encounters:  02/23/19 235 lb (106.6 kg)  11/18/18 224 lb (101.6 kg)  10/14/18 227 lb (103 kg)     Lab Results  Component Value Date   TSH 0.627 05/30/2017   Lab Results  Component Value Date   WBC 7.2 05/25/2017   HGB 14.1 05/25/2017   HCT 41.6 05/25/2017   MCV 87.4 05/25/2017   PLT 249 05/25/2017   Lab Results  Component Value Date   NA 143 10/14/2018   K 3.4 (L) 10/14/2018   CO2 22 10/14/2018   GLUCOSE 92 10/14/2018   BUN 8 10/14/2018   CREATININE 0.56 (L) 10/14/2018   BILITOT 0.3 10/14/2018   ALKPHOS 83 10/14/2018   AST 18 10/14/2018   ALT 19 10/14/2018   PROT 7.1  10/14/2018   ALBUMIN 4.5 10/14/2018   CALCIUM 9.5 10/14/2018   ANIONGAP 9 05/25/2017  Lab Results  Component Value Date   CHOL 243 (H) 10/14/2018   Lab Results  Component Value Date   HDL 48 10/14/2018   Lab Results  Component Value Date   LDLCALC 170 (H) 10/14/2018   Lab Results  Component Value Date   TRIG 126 10/14/2018   Lab Results  Component Value Date   CHOLHDL 5.1 (H) 10/14/2018   Lab Results  Component Value Date   HGBA1C 5.7 (A) 10/14/2018      Assessment & Plan:   Problem List Items Addressed This Visit      Cardiovascular and Mediastinum   Essential hypertension - Primary   Relevant Orders   Basic Metabolic Panel   Urinalysis Dipstick (Completed)     Respiratory   Asthma    Other Visit Diagnoses    Chronic seasonal allergic rhinitis         1. Essential hypertension - Continue medication, monitor blood pressure at home. Continue DASH diet. Reminder to go to the ER if any CP, SOB, nausea, dizziness, severe HA, changes vision/speech, left arm numbness and tingling and jaw pain.   - Basic Metabolic Panel - Urinalysis Dipstick - amLODipine (NORVASC) 10 MG tablet; Take 1 tablet (10 mg total) by mouth daily.  Dispense: 30 tablet; Refill: 5 - hydrochlorothiazide (HYDRODIURIL) 12.5 MG tablet; Take 1 tablet (12.5 mg total) by mouth daily.  Dispense: 30 tablet; Refill: 5  2. Mild intermittent asthma without complication No medication changes on today.   3. Chronic seasonal allergic rhinitis Continue lortadine 10 mg daily  4. Chronic pain syndrome Continue Naproxen and gabapentin, no medication changes warranted on today.  5. Anxiety and depression - busPIRone (BUSPAR) 10 MG tablet; Take 1 tablet (10 mg total) by mouth 2 (two) times daily.  Dispense: 60 tablet; Refill: 5  6. Anxiety state Patient has worsening anxiety and panic around the holidays since the death of her son.  - ALPRAZolam (XANAX) 0.25 MG tablet; Take 1 tablet (0.25 mg total)  by mouth 2 (two) times daily as needed for anxiety.  Dispense: 20 tablet; Refill: 0  7. Chronic low back pain without sciatica, unspecified back pain laterality - methylPREDNISolone sodium succinate (SOLU-MEDROL) 125 mg/2 mL injection 125 mg - DG Lumbar Spine Complete; Future  8. Tobacco abuse Smoking cessation instruction/counseling given:  counseled patient on the dangers of tobacco use, advised patient to stop smoking, and reviewed strategies to maximize success  9. Sciatica of right side - cyclobenzaprine (FLEXERIL) 10 MG tablet; Take 1 tablet (10 mg total) by mouth at bedtime.  Dispense: 20 tablet; Refill: 0  Follow-up: Return in about 1 month (around 03/26/2019).    Donia Pounds  APRN, MSN, FNP-C [facility] [address] [place], [place] [address] [phone]

## 2019-02-24 ENCOUNTER — Telehealth: Payer: Self-pay

## 2019-02-24 LAB — BASIC METABOLIC PANEL
BUN/Creatinine Ratio: 24 — ABNORMAL HIGH (ref 9–23)
BUN: 13 mg/dL (ref 6–24)
CO2: 20 mmol/L (ref 20–29)
Calcium: 9.5 mg/dL (ref 8.7–10.2)
Chloride: 105 mmol/L (ref 96–106)
Creatinine, Ser: 0.55 mg/dL — ABNORMAL LOW (ref 0.57–1.00)
GFR calc Af Amer: 123 mL/min/{1.73_m2} (ref >59)
GFR calc non Af Amer: 107 mL/min/{1.73_m2} (ref >59)
Glucose: 102 mg/dL — ABNORMAL HIGH (ref 65–99)
Potassium: 3.8 mmol/L (ref 3.5–5.2)
Sodium: 139 mmol/L (ref 134–144)

## 2019-02-24 NOTE — Telephone Encounter (Signed)
-----   Message from Dorena Dew, [place] sent at 02/24/2019  2:20 PM EST ----- Regarding: lab results Please inform patient that creatinine level, which is an indicator of kidney functioning is within a normal range.  No further medication changes warranted.  Follow-up as scheduled.Continue low-fat, low-salt diet divided over 5-6 small meals.  Also recommend that she increases her overall physical activities.Donia Pounds  APRN, MSN, FNP-C [facility] [address] [place], [place] [address] [phone]

## 2019-02-24 NOTE — Telephone Encounter (Signed)
Called and spoke with patient, advised that creatine was within normal limits and no medication changes at this time. Asked that she continue low fat, low-salt diet and work on physical activities. Thanks!

## 2019-02-25 ENCOUNTER — Encounter: Payer: Self-pay | Admitting: Emergency Medicine

## 2019-02-25 ENCOUNTER — Other Ambulatory Visit: Payer: Self-pay

## 2019-02-25 ENCOUNTER — Ambulatory Visit
Admission: EM | Admit: 2019-02-25 | Discharge: 2019-02-25 | Disposition: A | Discharge status: Home / Self Care | Payer: Self-pay | Attending: Physician Assistant | Admitting: Physician Assistant

## 2019-02-25 DIAGNOSIS — R109 Unspecified abdominal pain: Secondary | ICD-10-CM | POA: Insufficient documentation

## 2019-02-25 LAB — POCT URINALYSIS DIP (MANUAL ENTRY)
Bilirubin, UA: NEGATIVE
Glucose, UA: NEGATIVE mg/dL
Ketones, POC UA: NEGATIVE mg/dL
Nitrite, UA: NEGATIVE
Protein Ur, POC: NEGATIVE mg/dL
Spec Grav, UA: 1.025 (ref 1.010–1.025)
Urobilinogen, UA: 0.2 E.U./dL
pH, UA: 6.5 (ref 5.0–8.0)

## 2019-02-25 MED ORDER — TAMSULOSIN HCL 0.4 MG PO CAPS: 0.4000 mg | ORAL_CAPSULE | Freq: Every day | ORAL | 0 refills | Status: DC

## 2019-02-25 MED FILL — TAMSULOSIN HCL 0.4 MG CAP: 0.4 | 10 days supply | Qty: 10 | Fill #0

## 2019-02-25 NOTE — Discharge Instructions (Signed)
Your urine has been sent for culture, showed some blood. Start flomax as directed for possible kidney stone. Start ibuprofen 800mg  three times a day. You had a prescription called in for flexeril 2 days ago, continue this for possible back spasms. Keep hydrated, urine should be clear to pale yellow in color. Follow up with PCP if symptoms not improving. If worsening symptoms, still not able to move bowel/pass gas, worsening abdominal pain, fever, nausea/vomiting go to the emergency department for further evaluation needed.

## 2019-02-25 NOTE — ED Triage Notes (Signed)
Pt presents to [facility] for assessment after having abdominal pain to mid abdomen with right flank radiation.  1 episode of emesis last night.  Pt states she took gabapentin and a prednisone (leftovers) last night without relief.

## 2019-02-25 NOTE — ED Provider Notes (Signed)
[name]    CSN: [id number] Arrival date & time: 02/25/19  1403      History   Chief Complaint Chief Complaint  Patient presents with  . Abdominal Pain    HPI Deborah Knight is a 53 y.o. female.   53 year old female comes in for acute onset of abdominal pain and right flank pain last night.  States pain is to the mid abdomen that radiates to the right flank.  Pain is constant, waxing and waning in intensity, sharp and pressure in sensation.  She feels that is worse when laying to the side, better with standing up.  Denies changes in oral intake.  Denies nausea, but did have one episode of nonbilious nonbloody emesis last night.  No further episodes.  Denies constipation, diarrhea.  Last bowel movement 2 days ago, no straining.  Denies passing flatus today.  Denies urinary symptoms such as frequency, dysuria, hematuria.  Denies vaginal discharge, itching.  History of C-section, hernia repair, tubal ligation.     Past Medical History:  Diagnosis Date  . Anxiety   . Asthma   . HLD (hyperlipidemia)   . Hypertension   . Seasonal allergies     Patient Active Problem List   Diagnosis Date Noted  . Prediabetes 11/17/2013  . Unspecified vitamin D deficiency 09/23/2013  . Groin strain 09/14/2013  . Other malaise and fatigue 08/23/2013  . Metabolic syndrome 08/23/2013  . Back pain 08/23/2013  . Angioedema 01/06/2013  . Sprain and strain of shoulder and upper arm 10/09/2012  . Bicipital tenosynovitis 09/04/2012  . Joint derangement, shoulder region 07/10/2012  . Closed dislocation of shoulder 07/10/2012  . Dislocation of left shoulder joint 05/22/2012  . Right facial swelling 04/07/2012  . Rash 04/07/2012  . Tobacco abuse 02/04/2012  . KNEE PAIN, RIGHT 02/07/2010  . GERD 10/10/2009  . Depression 09/13/2009  . ALLERGIC RHINITIS 06/12/2009  . Obesity 08/30/2008  . LOW BACK PAIN SYNDROME 08/30/2008  . HYPERLIPIDEMIA 11/03/2006  . Anxiety, generalized  11/03/2006  . Essential hypertension 11/03/2006  . Asthma 11/03/2006    Past Surgical History:  Procedure Laterality Date  . CESAREAN SECTION  1997  . HERNIA REPAIR  1989  . SHOULDER ARTHROSCOPY    . TUBAL LIGATION  1997    OB History   No obstetric history on file.      Home Medications    Prior to Admission medications   Medication Sig Start Date End Date Taking? Authorizing Provider  acetaminophen (TYLENOL) 500 MG tablet Take 1 tablet (500 mg total) by mouth every 6 (six) hours as needed for moderate pain. 05/28/14   [name], FNP  albuterol (VENTOLIN HFA) 108 (90 Base) MCG/ACT inhaler Inhale 2 puffs into the lungs every 6 (six) hours as needed for wheezing. Shortness of breath 10/14/18   Mike Gip, FNP  ALPRAZolam Prudy Feeler) 0.25 MG tablet Take 1 tablet (0.25 mg total) by mouth 2 (two) times daily as needed for anxiety. 02/23/19   [name], FNP  amLODipine (NORVASC) 10 MG tablet Take 1 tablet (10 mg total) by mouth daily. 02/23/19   [name], FNP  busPIRone (BUSPAR) 10 MG tablet Take 1 tablet (10 mg total) by mouth 2 (two) times daily. 02/23/19   [name], FNP  cetirizine (ZYRTEC) 10 MG tablet Take 1 tablet (10 mg total) by mouth daily. 10/14/18   Mike Gip, FNP  cyclobenzaprine (FLEXERIL) 10 MG tablet Take 1 tablet (10 mg total) by  mouth at bedtime. 02/23/19   [name], FNP  fluticasone (FLONASE) 50 MCG/ACT nasal spray Place 2 sprays into both nostrils daily as needed (congestion). 10/14/18   Mike Gip, FNP  fluticasone-salmeterol (ADVAIR HFA) [phone] MCG/ACT inhaler Inhale 2 puffs into the lungs 2 (two) times daily. 10/14/18   Mike Gip, FNP  gabapentin (NEURONTIN) 300 MG capsule Take 1 capsule (300 mg total) by mouth 3 (three) times daily. 10/14/18   Mike Gip, FNP  hydrochlorothiazide (HYDRODIURIL) 12.5 MG tablet Take 1 tablet (12.5 mg total) by mouth daily. 02/23/19   [name], FNP  hydrOXYzine  (ATARAX/VISTARIL) 25 MG tablet Take by mouth.    [provider]  loratadine (CLARITIN) 10 MG tablet Take by mouth.    [provider]  metoprolol tartrate (LOPRESSOR) 25 MG tablet Take 0.5 tablets (12.5 mg total) by mouth 2 (two) times daily. 10/14/18   Mike Gip, FNP  montelukast (SINGULAIR) 10 MG tablet Take 1 tablet (10 mg total) by mouth at bedtime. 10/14/18   Mike Gip, FNP  naproxen (NAPROSYN) 500 MG tablet Take 1 tablet (500 mg total) by mouth 2 (two) times daily with a meal. 11/18/18   Mike Gip, FNP  omeprazole (PRILOSEC) 20 MG capsule Take 1 capsule (20 mg total) by mouth daily. 10/14/18   Mike Gip, FNP  potassium chloride (K-DUR) 10 MEQ tablet Take 1 tablet (10 mEq total) by mouth daily. 10/14/18   Mike Gip, FNP  pravastatin (PRAVACHOL) 40 MG tablet Take by mouth.    [provider]  ranitidine (ZANTAC) 300 MG tablet Take by mouth.    [provider]  tamsulosin (FLOMAX) 0.4 MG CAPS capsule Take 1 capsule (0.4 mg total) by mouth daily. 02/25/19   Cathie Hoops, Issaiah Seabrooks V, PA-C  traMADol (ULTRAM) 50 MG tablet Take 1 tablet (50 mg total) by mouth every 6 (six) hours as needed. Patient not taking: Reported on 02/23/2019 11/26/17   Mike Gip, FNP  triamcinolone cream (KENALOG) 0.1 % Apply 1 application topically 2 (two) times daily. 05/30/17   [name], FNP    Family History Family History  Problem Relation Age of Onset  . Asthma Father   . Cancer Father   . Asthma Sister   . Asthma Brother   . Asthma Maternal Grandmother   . Asthma Maternal Grandfather   . Asthma Paternal Grandmother   . Asthma Paternal Grandfather   . Hyperlipidemia Mother   . Hyperlipidemia Sister     Social History Social History   Tobacco Use  . Smoking status: Former Smoker    Types: Cigars    Quit date: 11/09/2015    Years since quitting: 3.2  . Smokeless tobacco: Current User  . Tobacco comment: vapor   Substance Use Topics  . Alcohol use: No   . Drug use: No     Allergies   Losartan, Shellfish-derived products, Toradol [ketorolac tromethamine], and Latex   Review of Systems Review of Systems  Reason unable to perform ROS: See HPI as above.     Physical Exam Triage Vital Signs ED Triage Vitals  Enc Vitals Group     BP 02/25/19 1433 (!) 157/100     Pulse Rate 02/25/19 1433 87     Resp 02/25/19 1433 18     Temp 02/25/19 1433 97.8 F (36.6 C)     Temp Source 02/25/19 1433 Temporal     SpO2 02/25/19 1433 97 %     Weight --  Height --      Head Circumference --      Peak Flow --      Pain Score 02/25/19 1435 8     Pain Loc --      Pain Edu? --      Excl. in GC? --    No data found.  Updated Vital Signs BP (!) 157/100 (BP Location: Left Arm)   Pulse 87   Temp 97.8 F (36.6 C) (Temporal)   Resp 18   LMP 08/13/2012   SpO2 97%   Physical Exam Constitutional:      General: She is not in acute distress.    Appearance: She is well-developed. She is not ill-appearing, toxic-appearing or diaphoretic.  HENT:     Head: Normocephalic and atraumatic.  Eyes:     Conjunctiva/sclera: Conjunctivae normal.     Pupils: Pupils are equal, round, and reactive to light.  Cardiovascular:     Rate and Rhythm: Normal rate and regular rhythm.     Heart sounds: Normal heart sounds. No murmur. No friction rub. No gallop.   Pulmonary:     Effort: Pulmonary effort is normal.     Breath sounds: Normal breath sounds. No wheezing or rales.  Abdominal:     General: Bowel sounds are normal.     Palpations: Abdomen is soft.     Tenderness: There is no abdominal tenderness. There is no right CVA tenderness, left CVA tenderness, guarding or rebound.     Hernia: No hernia is present.  Skin:    General: Skin is warm and dry.  Neurological:     Mental Status: She is alert and oriented to person, place, and time.  Psychiatric:        Behavior: Behavior normal.        Judgment: Judgment normal.    UC Treatments / Results   Labs (all labs ordered are listed, but only abnormal results are displayed) Labs Reviewed  POCT URINALYSIS DIP (MANUAL ENTRY) - Abnormal; Notable for the following components:      Result Value   Clarity, UA cloudy (*)    Blood, UA trace-intact (*)    Leukocytes, UA Small (1+) (*)    All other components within normal limits  URINE CULTURE    EKG   Radiology No results found.  Procedures Procedures (including critical care time)  Medications Ordered in UC Medications - No data to display  Initial Impression / Assessment and Plan / UC Course  I have reviewed the triage vital signs and the nursing notes.  Pertinent labs & imaging results that were available during my care of the patient were reviewed by me and considered in my medical decision making (see chart for details).    Urine shows trace blood and small leukocytes.  Abdomen soft, + BS, no tenderness to palpation of abdomen.  Although patient without passing flatus, low suspicion for bowel obstruction at this time. ?  Urethral stone causing symptoms.  Patient with history of lip swelling on Toradol, but able to take other NSAIDs.  Will have patient start ibuprofen, Flomax as directed.  She can continue Flexeril for possible muscle spasms as well.  Push fluids.  Return precautions given.  Patient expresses understanding and agrees to plan.  Discussed with patient, given currently have not moved bowels for 2 days, without passing flatus, would like to keep monitoring symptoms and make sure she does not develop a bowel obstruction.  Will defer narcotics at this time due to  worries for constipation complicating symptoms.  Patient can call for possible prescription of narcotic if symptoms not improving.  However, strict return precautions given as well.  Patient expresses understanding and agrees to plan.  Final Clinical Impressions(s) / UC Diagnoses   Final diagnoses:  Right flank pain   ED Prescriptions    Medication Sig  Dispense Auth. Provider   tamsulosin (FLOMAX) 0.4 MG CAPS capsule Take 1 capsule (0.4 mg total) by mouth daily. 10 capsule Ok Edwards, PA-C     PDMP not reviewed this encounter.   Ok Edwards, PA-C 02/25/19 1549

## 2019-02-26 LAB — URINE CULTURE: Culture: <10000 — AB

## 2019-03-09 ENCOUNTER — Ambulatory Visit
Admission: RE | Admit: 2019-03-09 | Discharge: 2019-03-09 | Disposition: A | Discharge status: Home / Self Care | Payer: No Typology Code available for payment source | Source: Ambulatory Visit | Attending: Obstetrics and Gynecology | Admitting: Obstetrics and Gynecology

## 2019-03-09 ENCOUNTER — Encounter (HOSPITAL_COMMUNITY): Payer: Self-pay

## 2019-03-09 ENCOUNTER — Other Ambulatory Visit: Payer: Self-pay

## 2019-03-09 ENCOUNTER — Ambulatory Visit (HOSPITAL_COMMUNITY)
Admission: RE | Admit: 2019-03-09 | Discharge: 2019-03-09 | Disposition: A | Discharge status: Home / Self Care | Payer: Self-pay | Source: Ambulatory Visit | Attending: Obstetrics and Gynecology | Admitting: Obstetrics and Gynecology

## 2019-03-09 DIAGNOSIS — Z1231 Encounter for screening mammogram for malignant neoplasm of breast: Secondary | ICD-10-CM

## 2019-03-09 DIAGNOSIS — Z1239 Encounter for other screening for malignant neoplasm of breast: Secondary | ICD-10-CM | POA: Insufficient documentation

## 2019-03-09 NOTE — Progress Notes (Signed)
No complaints today.   Pap Smear: Pap smear not completed today. Last Pap smear was 06/26/2016 at the [facility] and normal. Per patient has no history of an abnormal Pap smear. Last Pap smear result is in Epic.  Physical exam: Breasts Breasts symmetrical. No skin abnormalities bilateral breasts. No nipple retraction bilateral breasts. No nipple discharge bilateral breasts. No lymphadenopathy. No lumps palpated bilateral breasts. No complaints of pain or tenderness on exam. Referred patient to the [facility] for a screening mammogram. Appointment scheduled for Tuesday, March 09, 2019 at 0900.        Pelvic/Bimanual No Pap smear completed today since last Pap smear was 06/26/2016. Pap smear not indicated per BCCCP guidelines.   Smoking History: Patient is a current smoker. Discussed smoking cessation with patient. Referred to the [facility] Quitline and gave resources to the free smoking cessation classes at [facility].  Patient Navigation: Patient education provided. Access to services provided for patient through [facility] program.   Colorectal Cancer Screening: Per patient has never had a colonoscopy completed. No complaints today.   Breast and Cervical Cancer Risk Assessment: Patient has no family history of breast cancer, known genetic mutations, or radiation treatment to the chest before age 57. Patient has no history of cervical dysplasia, immunocompromised, or DES exposure in-utero.  Risk Assessment    Risk Scores      03/09/2019   Last edited by: Loletta Parish, RN   5-year risk: 1.3 %   Lifetime risk: 8 %

## 2019-03-09 NOTE — Patient Instructions (Signed)
Explained breast self awareness with Epifania Gore. Patient did not need a Pap smear today due to last Pap smear was 06/26/2016. Let her know BCCCP will cover Pap smears every 3 years unless has a history of abnormal Pap smears. Patient scheduled appointment at the free cervical cancer screening on Monday, Aug 02, 2019 at 0900. Referred patient to the [facility] for a screening mammogram. Appointment scheduled for Tuesday, March 09, 2019 at 0900. Patient aware of appointment and will be there. Let [facility] know [facility] will follow up with her within the next couple weeks with results of mammogram by letter or phone. Discussed smoking cessation with patient. Referred to the [facility] Quitline and gave resources to the free smoking cessation classes at [facility]. Deborah Knight verbalized understanding.  Kwamane Whack, Arvil Chaco, RN 8:32 AM

## 2019-03-11 ENCOUNTER — Ambulatory Visit: Payer: No Typology Code available for payment source | Admitting: Podiatry

## 2019-03-11 ENCOUNTER — Other Ambulatory Visit: Payer: Self-pay

## 2019-03-11 ENCOUNTER — Telehealth: Payer: Self-pay | Admitting: Podiatry

## 2019-03-11 ENCOUNTER — Encounter: Payer: Self-pay | Admitting: Podiatry

## 2019-03-11 DIAGNOSIS — M19079 Primary osteoarthritis, unspecified ankle and foot: Secondary | ICD-10-CM

## 2019-03-11 NOTE — Telephone Encounter (Signed)
error 

## 2019-03-11 NOTE — Progress Notes (Signed)
Subjective:  Patient ID: Deborah Knight, female    DOB: 12-25-1965,  MRN: [id number]  Chief Complaint  Patient presents with  . Arthritis    right midfoot , pt states that the shot helped for a few days but the pain has returned     53 y.o. female presents with the above complaint. Hx as above.    Review of Systems: Negative except as noted in the HPI. Denies N/V/F/Ch.  Past Medical History:  Diagnosis Date  . Anxiety   . Asthma   . HLD (hyperlipidemia)   . Hypertension   . Seasonal allergies     Current Outpatient Medications:  .  acetaminophen (TYLENOL) 500 MG tablet, Take 1 tablet (500 mg total) by mouth every 6 (six) hours as needed for moderate pain., Disp: 30 tablet, Rfl: 0 .  albuterol (VENTOLIN HFA) 108 (90 Base) MCG/ACT inhaler, Inhale 2 puffs into the lungs every 6 (six) hours as needed for wheezing. Shortness of breath, Disp: 6.7 g, Rfl: 5 .  ALPRAZolam (XANAX) 0.25 MG tablet, Take 1 tablet (0.25 mg total) by mouth 2 (two) times daily as needed for anxiety., Disp: 20 tablet, Rfl: 0 .  amLODipine (NORVASC) 10 MG tablet, Take 1 tablet (10 mg total) by mouth daily., Disp: 30 tablet, Rfl: 5 .  busPIRone (BUSPAR) 10 MG tablet, Take 1 tablet (10 mg total) by mouth 2 (two) times daily., Disp: 60 tablet, Rfl: 5 .  cetirizine (ZYRTEC) 10 MG tablet, Take 1 tablet (10 mg total) by mouth daily., Disp: 30 tablet, Rfl: 11 .  cyclobenzaprine (FLEXERIL) 10 MG tablet, Take 1 tablet (10 mg total) by mouth at bedtime., Disp: 20 tablet, Rfl: 0 .  fluticasone (FLONASE) 50 MCG/ACT nasal spray, Place 2 sprays into both nostrils daily as needed (congestion)., Disp: 16 g, Rfl: 3 .  fluticasone-salmeterol (ADVAIR HFA) 115-21 MCG/ACT inhaler, Inhale 2 puffs into the lungs 2 (two) times daily., Disp: 1 Inhaler, Rfl: 11 .  gabapentin (NEURONTIN) 300 MG capsule, Take 1 capsule (300 mg total) by mouth 3 (three) times daily., Disp: 90 capsule, Rfl: 1 .  hydrochlorothiazide (HYDRODIURIL) 12.5 MG  tablet, Take 1 tablet (12.5 mg total) by mouth daily., Disp: 30 tablet, Rfl: 5 .  hydrOXYzine (ATARAX/VISTARIL) 25 MG tablet, Take by mouth., Disp: , Rfl:  .  loratadine (CLARITIN) 10 MG tablet, Take by mouth., Disp: , Rfl:  .  metoprolol tartrate (LOPRESSOR) 25 MG tablet, Take 0.5 tablets (12.5 mg total) by mouth 2 (two) times daily., Disp: 60 tablet, Rfl: 5 .  montelukast (SINGULAIR) 10 MG tablet, Take 1 tablet (10 mg total) by mouth at bedtime., Disp: 90 tablet, Rfl: 3 .  naproxen (NAPROSYN) 500 MG tablet, Take 1 tablet (500 mg total) by mouth 2 (two) times daily with a meal., Disp: 30 tablet, Rfl: 0 .  omeprazole (PRILOSEC) 20 MG capsule, Take 1 capsule (20 mg total) by mouth daily., Disp: 90 capsule, Rfl: 3 .  potassium chloride (K-DUR) 10 MEQ tablet, Take 1 tablet (10 mEq total) by mouth daily., Disp: 30 tablet, Rfl: 1 .  pravastatin (PRAVACHOL) 40 MG tablet, Take by mouth., Disp: , Rfl:  .  ranitidine (ZANTAC) 300 MG tablet, Take by mouth., Disp: , Rfl:  .  tamsulosin (FLOMAX) 0.4 MG CAPS capsule, Take 1 capsule (0.4 mg total) by mouth daily., Disp: 10 capsule, Rfl: 0 .  traMADol (ULTRAM) 50 MG tablet, Take 1 tablet (50 mg total) by mouth every 6 (six) hours as needed. (Patient not  taking: Reported on 02/23/2019), Disp: 20 tablet, Rfl: 0 .  triamcinolone cream (KENALOG) 0.1 %, Apply 1 application topically 2 (two) times daily., Disp: 453.6 g, Rfl: 1  Social History   Tobacco Use  Smoking Status Current Some Day Smoker  . Types: Cigarettes  Smokeless Tobacco Never Used    Allergies  Allergen Reactions  . Losartan Swelling    Lip swelling  . Shellfish-Derived Products     Other reaction(s): Angioedema (ALLERGY/intolerance)  . Toradol [Ketorolac Tromethamine] Swelling    Lip swelling  . Latex Rash   Objective:   There were no vitals filed for this visit. There is no height or weight on file to calculate BMI. Constitutional Well developed. Well nourished.  Vascular Dorsalis  pedis pulses palpable bilaterally. Posterior tibial pulses palpable bilaterally. Capillary refill normal to all digits.  No cyanosis or clubbing noted. Pedal hair growth normal.  Neurologic Normal speech. Oriented to person, place, and time. Epicritic sensation to light touch grossly present bilaterally.  Dermatologic Nails well groomed and normal in appearance. No open wounds. No skin lesions.  Orthopedic: POP dorsal midfoot with prominent osteophytes   Radiographs: none Assessment:   1. Arthritis of midfoot    Plan:  Patient was evaluated and treated and all questions answered.   Midfoot arthritis -Repeat injection as below -Could possibly benefit from elective spur excision should pain persist.  Procedure: Joint Injection Location: Right dorsal TMT joint Skin Prep: Alcohol. Injectate: 0.5 cc 1% lidocaine plain, 0.5 cc dexamethasone phosphate. Disposition: Patient tolerated procedure well. Injection site dressed with a band-aid.     Return in about 6 weeks (around 04/22/2019).

## 2019-03-15 ENCOUNTER — Other Ambulatory Visit: Payer: Self-pay | Admitting: Family Medicine

## 2019-03-15 ENCOUNTER — Telehealth: Payer: Self-pay

## 2019-03-15 DIAGNOSIS — K0889 Other specified disorders of teeth and supporting structures: Secondary | ICD-10-CM

## 2019-03-15 NOTE — Telephone Encounter (Signed)
[place],  Patient called asking for a referral for dentist due to dental pain and a filling fell out. Can you please place order for referral? She does have orange card and I will send through them. Thanks!

## 2019-03-15 NOTE — Progress Notes (Unsigned)
Orders Placed This Encounter  Procedures  . Ambulatory referral to Dentistry    Referral Priority:   Routine    Referral Type:   Consultation    Referral Reason:   Specialty Services Required    Requested Specialty:   Dental General Practice    Number of Visits Requested:   1     Tynika Luddy Rennis Petty  APRN, MSN, FNP-C Patient Care [facility] [address] [place], [place] [address] [phone]

## 2019-03-24 ENCOUNTER — Telehealth: Payer: Self-pay | Admitting: *Deleted

## 2019-03-24 NOTE — Telephone Encounter (Signed)
Patient no show PV today. Called patient, no answer left message for the patient to call us back before 5 pm to reschedule the pv or the procedure would be cancelled.

## 2019-03-30 ENCOUNTER — Ambulatory Visit (INDEPENDENT_AMBULATORY_CARE_PROVIDER_SITE_OTHER): Payer: Self-pay | Admitting: Family Medicine

## 2019-03-30 ENCOUNTER — Encounter: Payer: Self-pay | Admitting: Family Medicine

## 2019-03-30 ENCOUNTER — Other Ambulatory Visit: Payer: Self-pay

## 2019-03-30 DIAGNOSIS — F17210 Nicotine dependence, cigarettes, uncomplicated: Secondary | ICD-10-CM

## 2019-03-30 DIAGNOSIS — F411 Generalized anxiety disorder: Secondary | ICD-10-CM

## 2019-03-30 DIAGNOSIS — F32A Depression, unspecified: Secondary | ICD-10-CM

## 2019-03-30 DIAGNOSIS — F410 Panic disorder [episodic paroxysmal anxiety] without agoraphobia: Secondary | ICD-10-CM

## 2019-03-30 DIAGNOSIS — F329 Major depressive disorder, single episode, unspecified: Secondary | ICD-10-CM

## 2019-03-30 NOTE — Progress Notes (Signed)
Patient [facility]   Virtual Visit via Telephone Note  I connected with Deborah Knight on 03/30/19 at  9:00 AM EST by telephone and verified that I am speaking with the correct person using two identifiers.   I discussed the limitations, risks, security and privacy concerns of performing an evaluation and management service by telephone and the availability of in person appointments. I also discussed with the patient that there may be a patient responsible charge related to this service. The patient expressed understanding and agreed to proceed.   History of Present Illness: Deborah Knight is a 54 year old female with a medical history significant for generalized anxiety disorder with panic, hypertension, moderate asthma, environmental allergies, and tobacco dependence following up for anxiety. Patient has a long history of anxiety and panic following the death of her son. She had a difficult time getting through the holidays. She endorses several episodes of panic that occurred when patient is alone. Panic is often triggered by loneliness.   Anxiety Presents for follow-up visit. Symptoms include excessive worry, insomnia, nervous/anxious behavior, panic and restlessness. Patient reports no decreased concentration, depressed mood, dizziness, dry mouth, feeling of choking, hyperventilation, impotence, nausea, obsessions, palpitations, shortness of breath or suicidal ideas.     Past Medical History:  Diagnosis Date  . Anxiety   . Asthma   . HLD (hyperlipidemia)   . Hypertension   . Seasonal allergies    Social History   Socioeconomic History  . Marital status: Divorced    Spouse name: Not on file  . Number of children: Not on file  . Years of education: Not on file  . Highest education level: Some college, no degree  Occupational History  . Not on file  Tobacco Use  . Smoking status: Current Some Day Smoker    Types:  Cigarettes  . Smokeless tobacco: Never Used  Substance and Sexual Activity  . Alcohol use: No  . Drug use: No  . Sexual activity: Not Currently    Birth control/protection: None  Other Topics Concern  . Not on file  Social History Narrative   Employed as a Psychologist, prison and probation services.   Social Determinants of Health   Financial Resource Strain:   . Difficulty of Paying Living Expenses: Not on file  Food Insecurity:   . Worried About Charity fundraiser in the Last Year: Not on file  . Ran Out of Food in the Last Year: Not on file  Transportation Needs: No Transportation Needs  . Lack of Transportation (Medical): No  . Lack of Transportation (Non-Medical): No  Physical Activity:   . Days of Exercise per Week: Not on file  . Minutes of Exercise per Session: Not on file  Stress:   . Feeling of Stress : Not on file  Social Connections:   . Frequency of Communication with Friends and Family: Not on file  . Frequency of Social Gatherings with Friends and Family: Not on file  . Attends Religious Services: Not on file  . Active Member of Clubs or Organizations: Not on file  . Attends Archivist Meetings: Not on file  . Marital Status: Not on file  Intimate Partner Violence:   . Fear of Current or Ex-Partner: Not on file  . Emotionally Abused: Not on file  . Physically Abused: Not on file  . Sexually Abused: Not on file     Tobacco Use: High Risk  . Smoking Tobacco Use: Current  Some Day Smoker  . Smokeless Tobacco Use: Never Used   Immunization History  Administered Date(s) Administered  . DTP 08/03/2004  . Influenza Split 04/09/2012  . Influenza Whole 02/09/2004, 02/03/2007, 02/22/2008, 06/12/2009, 01/23/2010  . Influenza,inj,Quad PF,6+ Mos 12/14/2012, 11/17/2013, 01/31/2015, 11/22/2015, 11/18/2018  . Pneumococcal Conjugate-13 09/17/2017  . Pneumococcal Polysaccharide-23 01/11/2004, 06/12/2009  . Td 06/29/2009   Allergies  Allergen Reactions  . Losartan Swelling     Lip swelling  . Shellfish-Derived Products     Other reaction(s): Angioedema (ALLERGY/intolerance)  . Toradol [Ketorolac Tromethamine] Swelling    Lip swelling  . Latex Rash       Assessment and Plan:  Anxiety and depression - busPIRone (BUSPAR) 10 MG tablet; Take 1 tablet (10 mg total) by mouth 2 (two) times daily.  Dispense: 60 tablet; Refill: 5 GAD 7 : Generalized Anxiety Score 03/30/2019  Nervous, Anxious, on Edge 2  Control/stop worrying 2  Worry too much - different things 2  Trouble relaxing 2  Restless 2  Easily annoyed or irritable 2    Anxiety state - ALPRAZolam (XANAX) 0.25 MG tablet; Take 1 tablet (0.25 mg total) by mouth 2 (two) times daily as needed for anxiety.  Dispense: 30 tablet; Refill: 0 Follow Up Instructions:    I discussed the assessment and treatment plan with the patient. The patient was provided an opportunity to ask questions and all were answered. The patient agreed with the plan and demonstrated an understanding of the instructions.   The patient was advised to call back or seek an in-person evaluation if the symptoms worsen or if the condition fails to improve as anticipated.  I provided 12 minutes of non-face-to-face time during this encounter.  Nolon Nations  APRN, MSN, FNP-C Patient Care [facility] [address] [place], [place] [address] [phone]

## 2019-04-02 ENCOUNTER — Encounter: Payer: Self-pay | Admitting: Gastroenterology

## 2019-04-03 MED ORDER — BUSPIRONE HCL 10 MG PO TABS: 10.0000 mg | ORAL_TABLET | Freq: Two times a day (BID) | ORAL | 5 refills | Status: DC

## 2019-04-03 MED ORDER — ALPRAZOLAM 0.25 MG PO TABS: 0.2500 mg | ORAL_TABLET | Freq: Two times a day (BID) | ORAL | 0 refills | Status: DC | PRN

## 2019-04-05 MED FILL — ?BUSPIRONE HCL 10 MG TABS: 10 | 30 days supply | Qty: 60 | Fill #0

## 2019-04-22 ENCOUNTER — Ambulatory Visit: Payer: Self-pay | Admitting: Podiatry

## 2019-04-22 ENCOUNTER — Ambulatory Visit: Payer: No Typology Code available for payment source

## 2019-04-22 ENCOUNTER — Other Ambulatory Visit: Payer: Self-pay

## 2019-04-28 ENCOUNTER — Other Ambulatory Visit: Payer: Self-pay | Admitting: Family Medicine

## 2019-04-28 DIAGNOSIS — F411 Generalized anxiety disorder: Secondary | ICD-10-CM

## 2019-04-29 NOTE — Telephone Encounter (Signed)
Patient requesting refill: Alprazolam 0.25

## 2019-04-30 ENCOUNTER — Encounter: Payer: Self-pay | Admitting: Podiatry

## 2019-05-21 MED FILL — ?BUSPIRONE HCL 10 MG TABLET: 10 | 30 days supply | Qty: 60 | Fill #0

## 2019-05-21 MED FILL — GABAPENTIN 300 MG CAPSULE: 300 | 30 days supply | Qty: 90 | Fill #1

## 2019-05-28 ENCOUNTER — Other Ambulatory Visit: Payer: Self-pay

## 2019-05-28 ENCOUNTER — Ambulatory Visit: Payer: Self-pay | Attending: Family Medicine

## 2019-06-07 ENCOUNTER — Telehealth: Payer: Self-pay

## 2019-06-07 NOTE — Telephone Encounter (Signed)
Patient is calling to see if paperwork has been received for OC and CAFA

## 2019-06-07 NOTE — Telephone Encounter (Signed)
Pt was informed that she has been approve for CAFA for 75% discount, sending a copy of the letter by mail to her

## 2019-06-10 ENCOUNTER — Ambulatory Visit: Payer: Self-pay | Admitting: Podiatry

## 2019-07-01 ENCOUNTER — Encounter: Payer: Self-pay | Admitting: Podiatry

## 2019-07-01 ENCOUNTER — Ambulatory Visit: Payer: No Typology Code available for payment source | Admitting: Podiatry

## 2019-07-01 ENCOUNTER — Ambulatory Visit: Payer: Self-pay | Admitting: Podiatry

## 2019-07-01 ENCOUNTER — Other Ambulatory Visit: Payer: Self-pay

## 2019-07-01 VITALS — Temp 96.7°F

## 2019-07-01 DIAGNOSIS — M249 Joint derangement, unspecified: Secondary | ICD-10-CM

## 2019-07-01 DIAGNOSIS — M79671 Pain in right foot: Secondary | ICD-10-CM

## 2019-07-01 DIAGNOSIS — M24176 Other articular cartilage disorders, unspecified foot: Secondary | ICD-10-CM

## 2019-07-01 DIAGNOSIS — M19079 Primary osteoarthritis, unspecified ankle and foot: Secondary | ICD-10-CM

## 2019-07-01 DIAGNOSIS — M25774 Osteophyte, right foot: Secondary | ICD-10-CM

## 2019-07-01 NOTE — Progress Notes (Signed)
  Subjective:  Patient ID: KOI YARBRO, female    DOB: September 22, 1965,  MRN: [id number]  Chief Complaint  Patient presents with  . Foot Pain    R midfoot. Pt stated, "The injection calmed the pain down a bit, but the pain became worse by the end of the week. 8/10 pain most of the time. I'm ready to talk about surgery".    54 y.o. female presents with the above complaint. History confirmed with patient.   Objective:  Physical Exam: warm, good capillary refill, no trophic changes or ulcerative lesions, normal DP and PT pulses and normal sensory exam.  Right Foot: POP right dorsal midfoot with prominent ostoephtes and spur formation.  Assessment:   1. Arthritis of midfoot   2. Metatarsal boss of right foot   3. Pain in right foot   4. Joint derangement of ankle or foot    Plan:  Patient was evaluated and treated and all questions answered.  Midfoot arthritis R -Still having pain and would like to consider surgery. Discussed surgical procedure and r/b/a. Patient elects to proceed. --Patient has failed all conservative therapy and wishes to proceed with surgical intervention. All risks, benefits, and alternatives discussed with patient. No guarantees given. Consent reviewed and signed by patient. -Planned procedures: right midfoot exostectomy   No follow-ups on file.

## 2019-07-01 NOTE — Patient Instructions (Signed)
Pre-Operative Instructions  Congratulations, you have decided to take an important step towards improving your quality of life.  You can be assured that the doctors and staff at [facility] will be with you every step of the way.  Here are some important things you should know:  1. Plan to be at the surgery center/hospital at least 1 (one) hour prior to your scheduled time, unless otherwise directed by the surgical center/hospital staff.  You must have a responsible adult accompany you, remain during the surgery and drive you home.  Make sure you have directions to the surgical center/hospital to ensure you arrive on time. 2. If you are having surgery at [facility] or [facility] hospitals, you will need a copy of your medical history and physical form from your family physician within one month prior to the date of surgery. We will give you a form for your primary physician to complete.  3. We make every effort to accommodate the date you request for surgery.  However, there are times where surgery dates or times have to be moved.  We will contact you as soon as possible if a change in schedule is required.   4. No aspirin/ibuprofen for one week before surgery.  If you are on aspirin, any non-steroidal anti-inflammatory medications (Mobic, Aleve, Ibuprofen) should not be taken seven (7) days prior to your surgery.  You make take Tylenol for pain prior to surgery.  5. Medications - If you are taking daily heart and blood pressure medications, seizure, reflux, allergy, asthma, anxiety, pain or diabetes medications, make sure you notify the surgery center/hospital before the day of surgery so they can tell you which medications you should take or avoid the day of surgery. 6. No food or drink after midnight the night before surgery unless directed otherwise by surgical center/hospital staff. 7. No alcoholic beverages 24-hours prior to surgery.  No smoking 24-hours prior or 24-hours after  surgery. 8. Wear loose pants or shorts. They should be loose enough to fit over bandages, boots, and casts. 9. Don't wear slip-on shoes. Sneakers are preferred. 10. Bring your boot with you to the surgery center/hospital.  Also bring crutches or a walker if your physician has prescribed it for you.  If you do not have this equipment, it will be provided for you after surgery. 11. If you have not been contacted by the surgery center/hospital by the day before your surgery, call to confirm the date and time of your surgery. 12. Leave-time from work may vary depending on the type of surgery you have.  Appropriate arrangements should be made prior to surgery with your employer. 13. Prescriptions will be provided immediately following surgery by your doctor.  Fill these as soon as possible after surgery and take the medication as directed. Pain medications will not be refilled on weekends and must be approved by the doctor. 14. Remove nail polish on the operative foot and avoid getting pedicures prior to surgery. 15. Wash the night before surgery.  The night before surgery wash the foot and leg well with water and the antibacterial soap provided. Be sure to pay special attention to beneath the toenails and in between the toes.  Wash for at least three (3) minutes. Rinse thoroughly with water and dry well with a towel.  Perform this wash unless told not to do so by your physician.  Enclosed: 1 Ice pack (please put in freezer the night before surgery)   1 Hibiclens skin cleaner     Pre-op instructions  If you have any questions regarding the instructions, please do not hesitate to call our office.  [place]: [address], [place], [place] [address] -- [phone]  [place]: [address] , [place] [address] -- [phone]  [place]: [address], [place], [place] [address] -- [phone]   Website: [email or web address] 

## 2019-07-13 ENCOUNTER — Other Ambulatory Visit: Payer: Self-pay | Admitting: Family Medicine

## 2019-07-13 DIAGNOSIS — F411 Generalized anxiety disorder: Secondary | ICD-10-CM

## 2019-07-13 NOTE — Telephone Encounter (Signed)
Can pt have a refill on this medication  

## 2019-07-16 MED FILL — ?BUSPIRONE HCL 10 MG TABLET: 10 | 30 days supply | Qty: 60 | Fill #1

## 2019-07-16 MED FILL — FLUTICASONE PROP 50 MCG SPR: 50 | 30 days supply | Qty: 16 | Fill #1

## 2019-07-16 MED FILL — MONTELUKAST SOD 10 MG TAB: 10 | 30 days supply | Qty: 30 | Fill #1

## 2019-07-20 ENCOUNTER — Other Ambulatory Visit: Payer: Self-pay

## 2019-07-20 ENCOUNTER — Encounter: Payer: Self-pay | Admitting: Family Medicine

## 2019-07-20 ENCOUNTER — Ambulatory Visit (INDEPENDENT_AMBULATORY_CARE_PROVIDER_SITE_OTHER): Payer: Self-pay | Admitting: Family Medicine

## 2019-07-20 VITALS — BP 136/83 | HR 78 | Temp 98.2°F | Ht 62.0 in | Wt 236.0 lb

## 2019-07-20 DIAGNOSIS — G629 Polyneuropathy, unspecified: Secondary | ICD-10-CM

## 2019-07-20 DIAGNOSIS — M25541 Pain in joints of right hand: Secondary | ICD-10-CM

## 2019-07-20 DIAGNOSIS — G8929 Other chronic pain: Secondary | ICD-10-CM

## 2019-07-20 DIAGNOSIS — Z131 Encounter for screening for diabetes mellitus: Secondary | ICD-10-CM

## 2019-07-20 DIAGNOSIS — M545 Low back pain, unspecified: Secondary | ICD-10-CM

## 2019-07-20 DIAGNOSIS — I10 Essential (primary) hypertension: Secondary | ICD-10-CM

## 2019-07-20 LAB — POCT URINALYSIS DIPSTICK
Bilirubin, UA: NEGATIVE
Blood, UA: NEGATIVE
Glucose, UA: NEGATIVE
Ketones, UA: NEGATIVE
Nitrite, UA: NEGATIVE
Protein, UA: NEGATIVE
Spec Grav, UA: 1.01 (ref 1.010–1.025)
Urobilinogen, UA: 0.2 E.U./dL
pH, UA: 7 (ref 5.0–8.0)

## 2019-07-20 LAB — POCT GLYCOSYLATED HEMOGLOBIN (HGB A1C)
HbA1c POC (<> result, manual entry): 5.5 % (ref 4.0–5.6)
HbA1c, POC (controlled diabetic range): 5.5 % (ref 0.0–7.0)
HbA1c, POC (prediabetic range): 5.5 % — AB (ref 5.7–6.4)
Hemoglobin A1C: 5.5 % (ref 4.0–5.6)

## 2019-07-20 MED ORDER — METHYLPREDNISOLONE SODIUM SUCC 125 MG IJ SOLR
80.0000 mg | Freq: Once | INTRAMUSCULAR | Status: AC
  Administered 2019-07-20: 80 mg via INTRAMUSCULAR

## 2019-07-20 NOTE — Progress Notes (Signed)
[facility]   Established Patient Office Visit  Subjective:  Patient ID: Deborah Knight, female    DOB: 15-Jan-1966  Age: 54 y.o. MRN: [id number]  CC:  Chief Complaint  Patient presents with  . sugery on foot    pt needs clearance on foot sugery on 05/26, numbness in hand  in right ,locking up x1 year     HPI Deborah Knight is a 54 year old female with a medical history significant for essential hypertension, moderate persistent asthma, environmental allergies, generalized anxiety disorder, and obesity presents for follow-up of chronic conditions and for medical clearance for an upcoming surgery. Patient has been followed by podiatry for right midfoot pain that has been worsening over the past several months.  Pain persists in spite of of steroid injections and conservative medication management.  Patient is scheduled for a right midfoot exostectomy on 08/04/2019.  Patient is warranting medical clearance for this procedure. Patient states that she has otherwise been doing well.  She has been taking all prescribed medications without complaint.  She does not check blood pressure at home.  Blood pressure has been well controlled over the past several months.  Patient has been attempting to follow a low-fat low carbohydrate diet.  She has not been exercising due to ongoing right foot pain.  Patient continues to be a chronic everyday tobacco user.  She is cut down on smoking some.  Also, patient has a history of moderate persistent asthma.  She has not had an asthma exacerbation in greater than 1 year.  Patient's trigger for asthma exacerbation is mostly environmental allergens and increased stress.  Patient endorses some numbness and tingling to right hand, mostly along the fingers.  This has been occurring for greater than 1 week.  Patient characterizes right hand pain as numbness, tingling, and occasionally shooting.  Patient has not been  working a job with repetitive movements or requiring fine motor movements.  She previously worked as a Conservation officer, nature and as a Location manager.  This pain primarily occurs at night.  Patient has not had an injury to right upper extremity.  She denies any swelling or redness.   Past Medical History:  Diagnosis Date  . Anxiety   . Asthma   . HLD (hyperlipidemia)   . Hypertension   . Seasonal allergies     Past Surgical History:  Procedure Laterality Date  . CESAREAN SECTION  1997  . HERNIA REPAIR  1989  . SHOULDER ARTHROSCOPY    . TUBAL LIGATION  1997    Family History  Problem Relation Age of Onset  . Cancer Father   . Asthma Brother   . Hyperlipidemia Mother     Social History   Socioeconomic History  . Marital status: Divorced    Spouse name: Not on file  . Number of children: Not on file  . Years of education: Not on file  . Highest education level: Some college, no degree  Occupational History  . Not on file  Tobacco Use  . Smoking status: Current Some Day Smoker    Types: Cigarettes  . Smokeless tobacco: Never Used  Substance and Sexual Activity  . Alcohol use: No  . Drug use: No  . Sexual activity: Not Currently    Birth control/protection: None  Other Topics Concern  . Not on file  Social History Narrative   Employed as a Education officer, environmental.   Social Determinants of Health   Financial Resource Strain:   .  Difficulty of Paying Living Expenses:   Food Insecurity:   . Worried About Programme researcher, broadcasting/film/video in the Last Year:   . Barista in the Last Year:   Transportation Needs: No Transportation Needs  . Lack of Transportation (Medical): No  . Lack of Transportation (Non-Medical): No  Physical Activity:   . Days of Exercise per Week:   . Minutes of Exercise per Session:   Stress:   . Feeling of Stress :   Social Connections:   . Frequency of Communication with Friends and Family:   . Frequency of Social Gatherings with Friends and Family:   . Attends  Religious Services:   . Active Member of Clubs or Organizations:   . Attends Banker Meetings:   Marland Kitchen Marital Status:   Intimate Partner Violence:   . Fear of Current or Ex-Partner:   . Emotionally Abused:   Marland Kitchen Physically Abused:   . Sexually Abused:     Outpatient Medications Prior to Visit  Medication Sig Dispense Refill  . acetaminophen (TYLENOL) 500 MG tablet Take 1 tablet (500 mg total) by mouth every 6 (six) hours as needed for moderate pain. 30 tablet 0  . albuterol (VENTOLIN HFA) 108 (90 Base) MCG/ACT inhaler Inhale 2 puffs into the lungs every 6 (six) hours as needed for wheezing. Shortness of breath 6.7 g 5  . ALPRAZolam (XANAX) 0.25 MG tablet TAKE ONE TABLET BY MOUTH TWICE A DAY AS NEEDED FOR ANXIETY 20 tablet 0  . amLODipine (NORVASC) 10 MG tablet Take 1 tablet (10 mg total) by mouth daily. 30 tablet 5  . busPIRone (BUSPAR) 10 MG tablet Take 1 tablet (10 mg total) by mouth 2 (two) times daily. 60 tablet 5  . cetirizine (ZYRTEC) 10 MG tablet Take 1 tablet (10 mg total) by mouth daily. 30 tablet 11  . cyclobenzaprine (FLEXERIL) 10 MG tablet Take 1 tablet (10 mg total) by mouth at bedtime. 20 tablet 0  . fluticasone (FLONASE) 50 MCG/ACT nasal spray Place 2 sprays into both nostrils daily as needed (congestion). 16 g 3  . fluticasone-salmeterol (ADVAIR HFA) 115-21 MCG/ACT inhaler Inhale 2 puffs into the lungs 2 (two) times daily. 1 Inhaler 11  . gabapentin (NEURONTIN) 300 MG capsule Take 1 capsule (300 mg total) by mouth 3 (three) times daily. 90 capsule 1  . hydrochlorothiazide (HYDRODIURIL) 12.5 MG tablet Take 1 tablet (12.5 mg total) by mouth daily. 30 tablet 5  . hydrOXYzine (ATARAX/VISTARIL) 25 MG tablet Take by mouth.    . loratadine (CLARITIN) 10 MG tablet Take by mouth.    . metoprolol tartrate (LOPRESSOR) 25 MG tablet Take 0.5 tablets (12.5 mg total) by mouth 2 (two) times daily. 60 tablet 5  . montelukast (SINGULAIR) 10 MG tablet Take 1 tablet (10 mg total) by  mouth at bedtime. 90 tablet 3  . omeprazole (PRILOSEC) 20 MG capsule Take 1 capsule (20 mg total) by mouth daily. 90 capsule 3  . potassium chloride (K-DUR) 10 MEQ tablet Take 1 tablet (10 mEq total) by mouth daily. 30 tablet 1  . pravastatin (PRAVACHOL) 40 MG tablet Take by mouth.    . triamcinolone cream (KENALOG) 0.1 % Apply 1 application topically 2 (two) times daily. 453.6 g 1  . naproxen (NAPROSYN) 500 MG tablet Take 1 tablet (500 mg total) by mouth 2 (two) times daily with a meal. (Patient not taking: Reported on 07/20/2019) 30 tablet 0  . ranitidine (ZANTAC) 300 MG tablet Take by mouth.    Marland Kitchen  tamsulosin (FLOMAX) 0.4 MG CAPS capsule Take 1 capsule (0.4 mg total) by mouth daily. (Patient not taking: Reported on 07/20/2019) 10 capsule 0  . traMADol (ULTRAM) 50 MG tablet Take 1 tablet (50 mg total) by mouth every 6 (six) hours as needed. (Patient not taking: Reported on 07/20/2019) 20 tablet 0   No facility-administered medications prior to visit.    Allergies  Allergen Reactions  . Losartan Swelling    Lip swelling  . Shellfish-Derived Products     Other reaction(s): Angioedema (ALLERGY/intolerance)  . Toradol [Ketorolac Tromethamine] Swelling    Lip swelling  . Latex Rash    ROS Review of Systems  Constitutional: Negative.  Negative for fatigue.  HENT: Negative.   Eyes: Negative.   Respiratory: Negative.   Cardiovascular: Negative.   Gastrointestinal: Negative.   Endocrine: Negative.   Musculoskeletal: Positive for arthralgias.       Right foot pain.  Pain to right fingers.  Allergic/Immunologic: Positive for environmental allergies.  Neurological: Positive for numbness.      Objective:    Physical Exam  Constitutional: She is oriented to person, place, and time. She appears well-developed and well-nourished.  HENT:  Head: Normocephalic.  Eyes: Pupils are equal, round, and reactive to light.  Neck: No thyromegaly present.  Cardiovascular: Normal rate and regular  rhythm.  Pulmonary/Chest: Effort normal.  Abdominal: Soft. Bowel sounds are normal.  Musculoskeletal:        General: Normal range of motion.     Cervical back: Normal range of motion.  Neurological: She is alert and oriented to person, place, and time.  Skin: Skin is warm and dry.  Psychiatric: She has a normal mood and affect. Her behavior is normal.    BP 136/83 (BP Location: Left Arm, Patient Position: Sitting)   Pulse 78   Ht 5\' 2"  (1.575 m)   Wt 236 lb (107 kg)   LMP 08/13/2012   SpO2 100%   BMI 43.16 kg/m  Wt Readings from Last 3 Encounters:  07/20/19 236 lb (107 kg)  03/09/19 232 lb (105.2 kg)  02/23/19 235 lb (106.6 kg)     Health Maintenance Due  Topic Date Due  . COVID-19 Vaccine (1) Never done  . TETANUS/TDAP  06/30/2019  . PAP SMEAR-Modifier  06/27/2019    There are no preventive care reminders to display for this patient.  Lab Results  Component Value Date   TSH 0.627 05/30/2017   Lab Results  Component Value Date   WBC 7.2 05/25/2017   HGB 14.1 05/25/2017   HCT 41.6 05/25/2017   MCV 87.4 05/25/2017   PLT 249 05/25/2017   Lab Results  Component Value Date   NA 139 02/23/2019   K 3.8 02/23/2019   CO2 20 02/23/2019   GLUCOSE 102 (H) 02/23/2019   BUN 13 02/23/2019   CREATININE 0.55 (L) 02/23/2019   BILITOT 0.3 10/14/2018   ALKPHOS 83 10/14/2018   AST 18 10/14/2018   ALT 19 10/14/2018   PROT 7.1 10/14/2018   ALBUMIN 4.5 10/14/2018   CALCIUM 9.5 02/23/2019   ANIONGAP 9 05/25/2017   Lab Results  Component Value Date   CHOL 243 (H) 10/14/2018   Lab Results  Component Value Date   HDL 48 10/14/2018   Lab Results  Component Value Date   LDLCALC 170 (H) 10/14/2018   Lab Results  Component Value Date   TRIG 126 10/14/2018   Lab Results  Component Value Date   CHOLHDL 5.1 (H) 10/14/2018  Lab Results  Component Value Date   HGBA1C 5.7 (A) 10/14/2018      Assessment & Plan:   Problem List Items Addressed This Visit    None      Neuropathy Patient reports periodic numbness and tingling to fingers of right hand for greater than 1 week. Screen for diabetes mellitus. Also, will approach conservatively with a wrist splint overnight.   Joint pain in fingers of right hand - Arthritis Panel  Chronic low back pain without sciatica, unspecified back pain laterality - methylPREDNISolone sodium succinate (SOLU-MEDROL) 125 mg/2 mL injection 80 mg  Screening for diabetes mellitus (DM) - HgB A1c  Essential hypertension - POCT Urinalysis Dipstick - Arthritis Panel - Basic Metabolic Panel  Follow-up: Patient is scheduled follow-up after procedure.  Also, she is medically cleared.  Form has been completed.   Donia Pounds  APRN, MSN, FNP-C [facility] [address] [place],  [address] [phone]

## 2019-07-20 NOTE — Patient Instructions (Signed)
Right wrist carpel tunnel brace  You received a solumedrol injection without complication.

## 2019-07-21 ENCOUNTER — Other Ambulatory Visit: Payer: No Typology Code available for payment source

## 2019-07-21 ENCOUNTER — Other Ambulatory Visit: Payer: Self-pay | Admitting: Family Medicine

## 2019-07-21 DIAGNOSIS — F411 Generalized anxiety disorder: Secondary | ICD-10-CM

## 2019-07-21 NOTE — Telephone Encounter (Signed)
Pt is requesting a refill a this medication it this okay to refill .

## 2019-07-22 LAB — ARTHRITIS PANEL
Basophils Absolute: 0 10*3/uL (ref 0.0–0.2)
Basos: 0 %
EOS (ABSOLUTE): 0 10*3/uL (ref 0.0–0.4)
Eos: 0 %
Hematocrit: 42 % (ref 34.0–46.6)
Hemoglobin: 14 g/dL (ref 11.1–15.9)
Immature Grans (Abs): 0 10*3/uL (ref 0.0–0.1)
Immature Granulocytes: 0 %
Lymphocytes Absolute: 2.3 10*3/uL (ref 0.7–3.1)
Lymphs: 18 %
MCH: 29.3 pg (ref 26.6–33.0)
MCHC: 33.3 g/dL (ref 31.5–35.7)
MCV: 88 fL (ref 79–97)
Monocytes Absolute: 1.1 10*3/uL — ABNORMAL HIGH (ref 0.1–0.9)
Monocytes: 9 %
Neutrophils Absolute: 9.6 10*3/uL — ABNORMAL HIGH (ref 1.4–7.0)
Neutrophils: 73 %
Platelets: 297 10*3/uL (ref 150–450)
RBC: 4.78 x10E6/uL (ref 3.77–5.28)
RDW: 14.3 % (ref 11.7–15.4)
Rheumatoid fact SerPl-aCnc: <10 IU/mL (ref 0.0–13.9)
Sed Rate: 28 mm/hr (ref 0–40)
Uric Acid: 6.2 mg/dL (ref 3.0–7.2)
WBC: 13.2 10*3/uL — ABNORMAL HIGH (ref 3.4–10.8)

## 2019-07-22 LAB — BASIC METABOLIC PANEL
BUN/Creatinine Ratio: 23 (ref 9–23)
BUN: 14 mg/dL (ref 6–24)
CO2: 21 mmol/L (ref 20–29)
Calcium: 10 mg/dL (ref 8.7–10.2)
Chloride: 103 mmol/L (ref 96–106)
Creatinine, Ser: 0.6 mg/dL (ref 0.57–1.00)
GFR calc Af Amer: 120 mL/min/{1.73_m2} (ref >59)
GFR calc non Af Amer: 104 mL/min/{1.73_m2} (ref >59)
Glucose: 116 mg/dL — ABNORMAL HIGH (ref 65–99)
Potassium: 3.4 mmol/L — ABNORMAL LOW (ref 3.5–5.2)
Sodium: 142 mmol/L (ref 134–144)

## 2019-07-28 ENCOUNTER — Encounter (HOSPITAL_BASED_OUTPATIENT_CLINIC_OR_DEPARTMENT_OTHER): Payer: Self-pay | Admitting: Podiatry

## 2019-07-29 ENCOUNTER — Other Ambulatory Visit: Payer: Self-pay

## 2019-07-29 ENCOUNTER — Encounter (HOSPITAL_BASED_OUTPATIENT_CLINIC_OR_DEPARTMENT_OTHER): Payer: Self-pay | Admitting: Podiatry

## 2019-07-29 NOTE — Progress Notes (Signed)
Spoke w/ via phone for pre-op interview--- PT Lab needs dos----   Istat and EKG            Lab results------ no COVID test ------ 07-31-2019 @ 0930 Arrive at ------- 0530 NPO after ------ [place] Medications to take morning of surgery ----- Buspar, Lopressor, Norvasc, Prilosec and Advair inhaler w/ sips of water Diabetic medication ----- n/a Patient Special Instructions ----- asked to bring rescue inhaler dos Pre-Op special Istructions ----- pt pcp H&P, Ladell Pier FNP, dated 07-20-2019 with chart Patient verbalized understanding of instructions that were given at this phone interview. Patient denies shortness of breath, chest pain, fever, cough a this phone interview.   Anesthesia Review:  NO  PCP: Julianne Handler FNP  (lov 07-20-2019 epic) Cardiologist : no Chest x-ray : 05-25-2017 epic EKG : 05-25-2017 epic Echo : no Stress test:  Pt stated never had a stress test Cardiac Cath :   no Sleep Study/ CPAP :  NO Fasting Blood Sugar :      / Checks Blood Sugar -- times a day:    N/A Blood Thinner/ Instructions /Last Dose:  NO ASA / Instructions/ Last Dose :   NO

## 2019-07-31 ENCOUNTER — Other Ambulatory Visit (HOSPITAL_COMMUNITY)
Admission: RE | Admit: 2019-07-31 | Discharge: 2019-07-31 | Disposition: A | Discharge status: Home / Self Care | Payer: HRSA Program | Source: Ambulatory Visit | Attending: Podiatry | Admitting: Podiatry

## 2019-07-31 DIAGNOSIS — Z20822 Contact with and (suspected) exposure to covid-19: Secondary | ICD-10-CM | POA: Insufficient documentation

## 2019-07-31 DIAGNOSIS — Z01812 Encounter for preprocedural laboratory examination: Secondary | ICD-10-CM | POA: Insufficient documentation

## 2019-07-31 LAB — SARS CORONAVIRUS 2 (TAT 6-24 HRS): SARS Coronavirus 2: NEGATIVE

## 2019-08-02 ENCOUNTER — Ambulatory Visit: Payer: Self-pay

## 2019-08-03 NOTE — Anesthesia Preprocedure Evaluation (Addendum)
Anesthesia Evaluation  Patient identified by MRN, date of birth, ID band Patient awake    Reviewed: Allergy & Precautions, NPO status , Patient's Chart, lab work & pertinent test results  Airway Mallampati: II  TM Distance: >3 FB Neck ROM: Full    Dental no notable dental hx.    Pulmonary asthma , Current Smoker and Patient abstained from smoking.,    Pulmonary exam normal breath sounds clear to auscultation       Cardiovascular hypertension, Pt. on medications and Pt. on home beta blockers Normal cardiovascular exam Rhythm:Regular Rate:Normal     Neuro/Psych negative neurological ROS  negative psych ROS   GI/Hepatic Neg liver ROS, GERD  Medicated,  Endo/Other  Morbid obesity  Renal/GU negative Renal ROS  negative genitourinary   Musculoskeletal negative musculoskeletal ROS (+)   Abdominal   Peds negative pediatric ROS (+)  Hematology negative hematology ROS (+)   Anesthesia Other Findings   Reproductive/Obstetrics negative OB ROS                            Anesthesia Physical Anesthesia Plan  ASA: III  Anesthesia Plan: MAC   Post-op Pain Management:    Induction: Intravenous  PONV Risk Score and Plan: 2 and Ondansetron and Treatment may vary due to age or medical condition  Airway Management Planned: Simple Face Mask  Additional Equipment:   Intra-op Plan:   Post-operative Plan:   Informed Consent: I have reviewed the patients History and Physical, chart, labs and discussed the procedure including the risks, benefits and alternatives for the proposed anesthesia with the patient or authorized representative who has indicated his/her understanding and acceptance.     Dental advisory given  Plan Discussed with: CRNA and Surgeon  Anesthesia Plan Comments:         Anesthesia Quick Evaluation

## 2019-08-04 ENCOUNTER — Ambulatory Visit (HOSPITAL_BASED_OUTPATIENT_CLINIC_OR_DEPARTMENT_OTHER): Payer: No Typology Code available for payment source | Admitting: Anesthesiology

## 2019-08-04 ENCOUNTER — Encounter (HOSPITAL_BASED_OUTPATIENT_CLINIC_OR_DEPARTMENT_OTHER)
Admission: RE | Disposition: A | Discharge status: Home / Self Care | Payer: Self-pay | Source: Home / Self Care | Attending: Podiatry

## 2019-08-04 ENCOUNTER — Ambulatory Visit (HOSPITAL_BASED_OUTPATIENT_CLINIC_OR_DEPARTMENT_OTHER)
Admission: RE | Admit: 2019-08-04 | Discharge: 2019-08-04 | Disposition: A | Discharge status: Home / Self Care | Payer: No Typology Code available for payment source | Attending: Podiatry | Admitting: Podiatry

## 2019-08-04 ENCOUNTER — Other Ambulatory Visit: Payer: Self-pay

## 2019-08-04 ENCOUNTER — Encounter (HOSPITAL_BASED_OUTPATIENT_CLINIC_OR_DEPARTMENT_OTHER): Payer: Self-pay | Admitting: Podiatry

## 2019-08-04 ENCOUNTER — Other Ambulatory Visit: Payer: Self-pay | Admitting: Podiatry

## 2019-08-04 DIAGNOSIS — M2570 Osteophyte, unspecified joint: Secondary | ICD-10-CM

## 2019-08-04 DIAGNOSIS — Z6841 Body Mass Index (BMI) 40.0 and over, adult: Secondary | ICD-10-CM | POA: Insufficient documentation

## 2019-08-04 DIAGNOSIS — M19079 Primary osteoarthritis, unspecified ankle and foot: Secondary | ICD-10-CM

## 2019-08-04 DIAGNOSIS — F17200 Nicotine dependence, unspecified, uncomplicated: Secondary | ICD-10-CM | POA: Insufficient documentation

## 2019-08-04 DIAGNOSIS — M899 Disorder of bone, unspecified: Secondary | ICD-10-CM | POA: Insufficient documentation

## 2019-08-04 DIAGNOSIS — I10 Essential (primary) hypertension: Secondary | ICD-10-CM | POA: Insufficient documentation

## 2019-08-04 HISTORY — DX: Other seasonal allergic rhinitis: J30.2

## 2019-08-04 HISTORY — DX: Mild intermittent asthma, uncomplicated: J45.20

## 2019-08-04 HISTORY — DX: Gastro-esophageal reflux disease without esophagitis: K21.9

## 2019-08-04 HISTORY — DX: Depression, unspecified: F32.A

## 2019-08-04 HISTORY — DX: Unspecified osteoarthritis, unspecified site: M19.90

## 2019-08-04 HISTORY — DX: Sciatica, right side: M54.31

## 2019-08-04 HISTORY — DX: Anesthesia of skin: R20.0

## 2019-08-04 HISTORY — DX: Presence of spectacles and contact lenses: Z97.3

## 2019-08-04 HISTORY — DX: Other chronic pain: G89.29

## 2019-08-04 HISTORY — DX: Low back pain, unspecified: M54.50

## 2019-08-04 HISTORY — DX: Prediabetes: R73.03

## 2019-08-04 HISTORY — PX: EXOSTECTECTOMY TOE: SHX6618

## 2019-08-04 HISTORY — DX: Sleep apnea, unspecified: G47.30

## 2019-08-04 HISTORY — DX: Polyneuropathy, unspecified: G62.9

## 2019-08-04 LAB — POCT I-STAT, CHEM 8
BUN: 13 mg/dL (ref 6–20)
Calcium, Ion: 1.34 mmol/L (ref 1.15–1.40)
Chloride: 105 mmol/L (ref 98–111)
Creatinine, Ser: 0.4 mg/dL — ABNORMAL LOW (ref 0.44–1.00)
Glucose, Bld: 98 mg/dL (ref 70–99)
HCT: 41 % (ref 36.0–46.0)
Hemoglobin: 13.9 g/dL (ref 12.0–15.0)
Potassium: 3.2 mmol/L — ABNORMAL LOW (ref 3.5–5.1)
Sodium: 142 mmol/L (ref 135–145)
TCO2: 28 mmol/L (ref 22–32)

## 2019-08-04 SURGERY — RELEASE, TARSAL TUNNEL
Anesthesia: General | Site: Foot | Laterality: Right

## 2019-08-04 SURGERY — EXCISION, EXOSTOSIS, TOE
Anesthesia: Monitor Anesthesia Care | Site: Foot | Laterality: Right

## 2019-08-04 MED ORDER — CEPHALEXIN 500 MG PO CAPS
500.0000 mg | ORAL_CAPSULE | Freq: Two times a day (BID) | ORAL | 0 refills | Status: DC
Start: 2019-08-04 — End: 2020-08-09

## 2019-08-04 MED ORDER — DEXAMETHASONE SODIUM PHOSPHATE 4 MG/ML IJ SOLN
INTRAMUSCULAR | Status: DC | PRN
  Administered 2019-08-04: 5 mg via INTRAVENOUS

## 2019-08-04 MED ORDER — LIDOCAINE HCL (CARDIAC) PF 100 MG/5ML IV SOSY
PREFILLED_SYRINGE | INTRAVENOUS | Status: DC | PRN
  Administered 2019-08-04: 60 mg via INTRAVENOUS

## 2019-08-04 MED ORDER — PROPOFOL 500 MG/50ML IV EMUL
INTRAVENOUS | Status: DC | PRN
  Administered 2019-08-04: 50 ug/kg/min via INTRAVENOUS
  Administered 2019-08-04: 25 ug/kg/min via INTRAVENOUS

## 2019-08-04 MED ORDER — ONDANSETRON HCL 4 MG/2ML IJ SOLN
INTRAMUSCULAR | Status: AC
  Filled 2019-08-04: qty 2

## 2019-08-04 MED ORDER — PROPOFOL 10 MG/ML IV BOLUS
INTRAVENOUS | Status: DC | PRN
  Administered 2019-08-04: 20 mg via INTRAVENOUS

## 2019-08-04 MED ORDER — OXYCODONE HCL 5 MG/5ML PO SOLN: 5.0000 mg | Freq: Once | ORAL | Status: DC | PRN

## 2019-08-04 MED ORDER — MIDAZOLAM HCL 5 MG/5ML IJ SOLN
INTRAMUSCULAR | Status: DC | PRN
  Administered 2019-08-04: 2 mg via INTRAVENOUS

## 2019-08-04 MED ORDER — ONDANSETRON HCL 4 MG/2ML IJ SOLN
INTRAMUSCULAR | Status: DC | PRN
  Administered 2019-08-04: 4 mg via INTRAVENOUS

## 2019-08-04 MED ORDER — LIDOCAINE 2% (20 MG/ML) 5 ML SYRINGE
INTRAMUSCULAR | Status: AC
  Filled 2019-08-04: qty 5

## 2019-08-04 MED ORDER — BUPIVACAINE HCL (PF) 0.5 % IJ SOLN
INTRAMUSCULAR | Status: DC | PRN
  Administered 2019-08-04: 20 mL

## 2019-08-04 MED ORDER — OXYCODONE-ACETAMINOPHEN 10-325 MG PO TABS: 1.0000 | ORAL_TABLET | ORAL | 0 refills | Status: DC | PRN

## 2019-08-04 MED ORDER — BUPIVACAINE HCL (PF) 0.5 % IJ SOLN
INTRAMUSCULAR | Status: AC
  Filled 2019-08-04: qty 30

## 2019-08-04 MED ORDER — METOCLOPRAMIDE HCL 5 MG/ML IJ SOLN
INTRAMUSCULAR | Status: AC
  Filled 2019-08-04: qty 2

## 2019-08-04 MED ORDER — CEFAZOLIN SODIUM-DEXTROSE 2-4 GM/100ML-% IV SOLN
2.0000 g | INTRAVENOUS | Status: AC
  Administered 2019-08-04: 2 g via INTRAVENOUS

## 2019-08-04 MED ORDER — DEXAMETHASONE SODIUM PHOSPHATE 10 MG/ML IJ SOLN
INTRAMUSCULAR | Status: AC
  Filled 2019-08-04: qty 1

## 2019-08-04 MED ORDER — FENTANYL CITRATE (PF) 100 MCG/2ML IJ SOLN: 25.0000 ug | INTRAMUSCULAR | Status: DC | PRN

## 2019-08-04 MED ORDER — MIDAZOLAM HCL 2 MG/2ML IJ SOLN
INTRAMUSCULAR | Status: AC
  Filled 2019-08-04: qty 2

## 2019-08-04 MED ORDER — FENTANYL CITRATE (PF) 100 MCG/2ML IJ SOLN
INTRAMUSCULAR | Status: DC | PRN
  Administered 2019-08-04 (×3): 25 ug via INTRAVENOUS

## 2019-08-04 MED ORDER — FENTANYL CITRATE (PF) 100 MCG/2ML IJ SOLN
INTRAMUSCULAR | Status: AC
  Filled 2019-08-04: qty 2

## 2019-08-04 MED ORDER — OXYCODONE HCL 5 MG PO TABS: 5.0000 mg | ORAL_TABLET | Freq: Once | ORAL | Status: DC | PRN

## 2019-08-04 MED ORDER — CEFAZOLIN SODIUM-DEXTROSE 2-4 GM/100ML-% IV SOLN
INTRAVENOUS | Status: AC
  Filled 2019-08-04: qty 100

## 2019-08-04 MED ORDER — SODIUM CHLORIDE 0.9 % IR SOLN
Status: DC | PRN
  Administered 2019-08-04: 500 mL

## 2019-08-04 MED ORDER — VANCOMYCIN HCL 1000 MG IV SOLR
INTRAVENOUS | Status: AC
  Filled 2019-08-04: qty 1000

## 2019-08-04 MED ORDER — LACTATED RINGERS IV SOLN: INTRAVENOUS | Status: DC

## 2019-08-04 MED ORDER — METOCLOPRAMIDE HCL 5 MG/ML IJ SOLN
INTRAMUSCULAR | Status: DC | PRN
  Administered 2019-08-04: 10 mg via INTRAVENOUS

## 2019-08-04 MED FILL — CEPHALEXIN 500 MG CAPSULE: 500 | 7 days supply | Qty: 14 | Fill #0

## 2019-08-04 SURGICAL SUPPLY — 57 items
ADH SKN CLS APL DERMABOND .7 (GAUZE/BANDAGES/DRESSINGS) ×1
APL PRP STRL LF DISP 70% ISPRP (MISCELLANEOUS) ×1
APL SKNCLS STERI-STRIP NONHPOA (GAUZE/BANDAGES/DRESSINGS) ×1
BENZOIN TINCTURE PRP APPL 2/3 (GAUZE/BANDAGES/DRESSINGS) ×2 IMPLANT
BLADE AVERAGE 25MMX9MM (BLADE) ×1
BLADE AVERAGE 25X9 (BLADE) ×1 IMPLANT
BLADE SURG 10 STRL SS (BLADE) IMPLANT
BLADE SURG 15 STRL LF DISP TIS (BLADE) ×1 IMPLANT
BLADE SURG 15 STRL SS (BLADE) ×3
BNDG ELASTIC 4X5.8 VLCR STR LF (GAUZE/BANDAGES/DRESSINGS) ×3 IMPLANT
BNDG GAUZE ELAST 4 BULKY (GAUZE/BANDAGES/DRESSINGS) ×3 IMPLANT
CHLORAPREP W/TINT 26 (MISCELLANEOUS) ×2 IMPLANT
CNTNR URN SCR LID CUP LEK RST (MISCELLANEOUS) ×1 IMPLANT
CONT SPEC 4OZ STRL OR WHT (MISCELLANEOUS) ×3
COVER BACK TABLE 60X90IN (DRAPES) ×3 IMPLANT
COVER WAND RF STERILE (DRAPES) ×3 IMPLANT
CUFF TOURN SGL QUICK 18X4 (TOURNIQUET CUFF) ×3 IMPLANT
CUFF TOURN SGL QUICK 24 (TOURNIQUET CUFF)
CUFF TRNQT CYL 24X4X16.5-23 (TOURNIQUET CUFF) IMPLANT
DECANTER SPIKE VIAL GLASS SM (MISCELLANEOUS) ×3 IMPLANT
DERMABOND ADVANCED (GAUZE/BANDAGES/DRESSINGS) ×2
DERMABOND ADVANCED .7 DNX12 (GAUZE/BANDAGES/DRESSINGS) IMPLANT
DRAPE 3/4 80X56 (DRAPES) ×3 IMPLANT
DRAPE EXTREMITY T 121X128X90 (DISPOSABLE) ×3 IMPLANT
DRSG TELFA 3X8 NADH (GAUZE/BANDAGES/DRESSINGS) ×3 IMPLANT
GAUZE 4X4 16PLY RFD (DISPOSABLE) IMPLANT
GAUZE SPONGE 4X4 12PLY STRL (GAUZE/BANDAGES/DRESSINGS) ×3 IMPLANT
GAUZE SPONGE 4X4 12PLY STRL LF (GAUZE/BANDAGES/DRESSINGS) ×2 IMPLANT
GAUZE XEROFORM 1X8 LF (GAUZE/BANDAGES/DRESSINGS) ×3 IMPLANT
GLOVE BIO SURGEON STRL SZ7.5 (GLOVE) ×3 IMPLANT
GLOVE BIOGEL PI IND STRL 8 (GLOVE) ×1 IMPLANT
GLOVE BIOGEL PI INDICATOR 8 (GLOVE) ×2
GOWN STRL REUS W/TWL XL LVL3 (GOWN DISPOSABLE) ×3 IMPLANT
NDL HYPO 25X1 1.5 SAFETY (NEEDLE) ×1 IMPLANT
NEEDLE HYPO 25X1 1.5 SAFETY (NEEDLE) ×3 IMPLANT
PAD DRESSING TELFA 3X8 NADH (GAUZE/BANDAGES/DRESSINGS) IMPLANT
PENCIL BUTTON HOLSTER BLD 10FT (ELECTRODE) ×3 IMPLANT
RASP SM TEAR CROSS CUT (RASP) ×2 IMPLANT
SET BASIN DAY SURGERY F.S. (CUSTOM PROCEDURE TRAY) ×3 IMPLANT
STAPLER VISISTAT 35W (STAPLE) IMPLANT
STOCKINETTE 6  STRL (DRAPES) ×3
STOCKINETTE 6 STRL (DRAPES) ×1 IMPLANT
SUCTION FRAZIER HANDLE 10FR (MISCELLANEOUS) ×3
SUCTION TUBE FRAZIER 10FR DISP (MISCELLANEOUS) ×1 IMPLANT
SUT ETHILON 4 0 PS 2 18 (SUTURE) ×3 IMPLANT
SUT MNCRL AB 3-0 PS2 18 (SUTURE) ×3 IMPLANT
SUT MNCRL AB 3-0 PS2 27 (SUTURE) ×3 IMPLANT
SUT MNCRL AB 4-0 PS2 18 (SUTURE) ×3 IMPLANT
SUT VIC AB 2-0 SH 27 (SUTURE) ×3
SUT VIC AB 2-0 SH 27XBRD (SUTURE) ×1 IMPLANT
SYR BULB EAR ULCER 3OZ GRN STR (SYRINGE) ×3 IMPLANT
TOWEL OR 17X26 10 PK STRL BLUE (TOWEL DISPOSABLE) ×3 IMPLANT
TRAY DSU PREP LF (CUSTOM PROCEDURE TRAY) ×3 IMPLANT
TUBE CONNECTING 12'X1/4 (SUCTIONS) ×1
TUBE CONNECTING 12X1/4 (SUCTIONS) ×2 IMPLANT
UNDERPAD 30X30 (UNDERPADS AND DIAPERS) ×12 IMPLANT
YANKAUER SUCT BULB TIP NO VENT (SUCTIONS) ×3 IMPLANT

## 2019-08-04 NOTE — Progress Notes (Signed)
Orthopedic Tech Progress Note Patient Details:  Deborah Knight 1965-09-10 [id number]  Ortho Devices Type of Ortho Device: CAM walker Ortho Device/Splint Interventions: Application   Post Interventions Patient Tolerated: Well Instructions Provided: Care of device   Saul Fordyce 08/04/2019, 9:33 AM22

## 2019-08-04 NOTE — Transfer of Care (Addendum)
Last Vitals:  Vitals Value Taken Time  BP 120/77 08/04/19 0848  Temp 36.4 C 08/04/19 0848  Pulse 70 08/04/19 0850  Resp 17 08/04/19 0850  SpO2 96 % 08/04/19 0850  Vitals shown include unvalidated device data.  Last Pain:  Vitals:   08/04/19 0631  TempSrc: Oral  PainSc: 6       Patients Stated Pain Goal: 5 (08/04/19 0631)  Immediate Anesthesia Transfer of Care Note  Patient: Deborah Knight  Procedure(s) Performed: Procedure(s) (LRB): EXOSTECTECTOMY Right MIDFOOT (Right)  Patient Location: PACU  Anesthesia Type:MAC  Level of Consciousness: awake, alert  and oriented  Airway & Oxygen Therapy: Patient Spontanous Breathing and Patient connected to face mask oxygen  Post-op Assessment: Report given to PACU RN and Post -op Vital signs reviewed and stable  Post vital signs: Reviewed and stable  Complications: No apparent anesthesia complications

## 2019-08-04 NOTE — Progress Notes (Signed)
Rx instead sent to [facility]

## 2019-08-04 NOTE — Op Note (Signed)
Patient Name: Deborah Knight DOB: March 10, 1966  MRN: [id number]   Date of Service: 08/04/2019  Surgeon: Dr. Hardie Pulley, DPM Assistants: None Pre-operative Diagnosis:  Midfoot exostosis right Post-operative Diagnosis:  Same Procedures:  1) midfoot exostectomy right Pathology/Specimens: * No specimens in log * Anesthesia: MAC/local with 20 ccs 0.5% marcaine plain Hemostasis:  Total Tourniquet Time Documented: Calf (Right) - 35 minutes Total: Calf (Right) - 35 minutes  Estimated Blood Loss: 2 ml Materials: * No implants in log * Medications: None Complications: none  Indications for Procedure:  This is a 54 y.o. female with a painful dorsal midfoot prominence for which she desires elective removal after failing all conservative therapy.   Procedure in Detail: Patient was identified in pre-operative holding area. Formal consent was signed and the right lower extremity was marked. Patient was brought back to the operating room. Anesthesia was induced. The extremity was prepped and draped in the usual sterile fashion. Timeout was taken to confirm patient name, laterality, and procedure prior to incision.   Attention was then directed to the right dorsal midfoot.  An incision was made about the dorsal aspect of the midfoot lateral to the palpable dorsalis pedis artery.  Dissection is carried down through skin and subcutaneous tissue with care to avoid all vital neurovascular structures.  The dorsalis pedis was retracted safely during entire procedure.  Dissection was continued down to the muscle belly down to level of the bone.  The periosteum was incised and a Valora Corporal was used to free the periosteum from the bone.  The bony prominence was identified on fluoroscopy.  The osseous prominence was reduced with rongeur and oscillating rasp.  Fluoroscopy was used to confirm adequate resection.  Wound was then thoroughly irrigated and closed in layers with 2-0 Vicryl 3-0 Monocryl 4-0 and 5-0  Monocryl.  The skin was closed with Dermabond.  The tourniquet was deflated and the dorsalis pedis artery was palpable.  The foot was then dressed with telfa, 4x4. Kerlix, ACE bandage. Patient tolerated the procedure well.   Disposition: Following a period of post-operative monitoring, patient will be transferred home.Marland Kitchen

## 2019-08-04 NOTE — H&P (Signed)
  Subjective:  Patient ID: Deborah Knight, female    DOB: 07/17/1965,  MRN: [id number]  Patient seen in pre-op. Understands plan for the OR today. Denies changes to PMH since last seen in the office. Objective:   Vitals:   08/04/19 0631  BP: (!) 168/106  Pulse: 95  Resp: 18  Temp: 98.6 F (37 C)  SpO2: 100%   General AA&O x3. Normal mood and affect.  Vascular Dorsalis pedis and posterior tibial pulses 2/4 bilat. Brisk capillary refill to all digits. Pedal hair present.  Neurologic Epicritic sensation grossly intact.  Dermatologic No open lesions. Interspaces clear of maceration. Nails well groomed and normal in appearance.  Orthopedic: POP right dorsal midfoot with prominent osteophytes    Assessment & Plan:  Patient was evaluated and treated and all questions answered.  Right Midfoot Arthritis -Proceed to OR today for midfoot exostectomy -Consent reviewed and signed. -RLE marked. -Proceed to OR.  Park Liter, DPM  Accessible via secure chat for questions or concerns.

## 2019-08-04 NOTE — Anesthesia Postprocedure Evaluation (Signed)
Anesthesia Post Note  Patient: Deborah Knight  Procedure(s) Performed: EXOSTECTECTOMY Right MIDFOOT (Right Foot)     Patient location during evaluation: PACU Anesthesia Type: MAC Level of consciousness: awake and alert Pain management: pain level controlled Vital Signs Assessment: post-procedure vital signs reviewed and stable Respiratory status: spontaneous breathing, nonlabored ventilation, respiratory function stable and patient connected to nasal cannula oxygen Cardiovascular status: stable and blood pressure returned to baseline Postop Assessment: no apparent nausea or vomiting Anesthetic complications: no    Last Vitals:  Vitals:   08/04/19 0900 08/04/19 0915  BP: 133/87 129/88  Pulse: 65 72  Resp: 14 18  Temp:    SpO2: 98% 96%    Last Pain:  Vitals:   08/04/19 0915  TempSrc:   PainSc: 0-No pain                 ROSE,GEORGE S

## 2019-08-04 NOTE — Discharge Instructions (Signed)
Weight bearing as tolerated right foot. Use boot at all times when up and about.  Post Anesthesia Home Care Instructions  Activity: Get plenty of rest for the remainder of the day. A responsible individual must stay with you for 24 hours following the procedure.  For the next 24 hours, DO NOT: -Drive a car -Advertising copywriter -Drink alcoholic beverages -Take any medication unless instructed by your physician -Make any legal decisions or sign important papers.  Meals: Start with liquid foods such as gelatin or soup. Progress to regular foods as tolerated. Avoid greasy, spicy, heavy foods. If nausea and/or vomiting occur, drink only clear liquids until the nausea and/or vomiting subsides. Call your physician if vomiting continues.  Special Instructions/Symptoms: Your throat may feel dry or sore from the anesthesia or the breathing tube placed in your throat during surgery. If this causes discomfort, gargle with warm salt water. The discomfort should disappear within 24 hours.

## 2019-08-04 NOTE — Addendum Note (Signed)
Addendum  created 08/04/19 1139 by Norva Pavlov, CRNA   Clinical Note Signed, Flowsheet accepted, Intraprocedure Flowsheets edited

## 2019-08-12 ENCOUNTER — Other Ambulatory Visit: Payer: Self-pay

## 2019-08-12 ENCOUNTER — Ambulatory Visit: Payer: No Typology Code available for payment source

## 2019-08-12 ENCOUNTER — Ambulatory Visit (INDEPENDENT_AMBULATORY_CARE_PROVIDER_SITE_OTHER): Payer: No Typology Code available for payment source | Admitting: Podiatry

## 2019-08-12 VITALS — Temp 97.6°F

## 2019-08-12 DIAGNOSIS — M19079 Primary osteoarthritis, unspecified ankle and foot: Secondary | ICD-10-CM

## 2019-08-12 DIAGNOSIS — Z9889 Other specified postprocedural states: Secondary | ICD-10-CM

## 2019-08-12 MED ORDER — OXYCODONE-ACETAMINOPHEN 10-325 MG PO TABS: 1.0000 | ORAL_TABLET | ORAL | 0 refills | Status: DC | PRN

## 2019-08-19 ENCOUNTER — Ambulatory Visit (INDEPENDENT_AMBULATORY_CARE_PROVIDER_SITE_OTHER): Payer: No Typology Code available for payment source | Admitting: Podiatry

## 2019-08-19 ENCOUNTER — Other Ambulatory Visit: Payer: Self-pay

## 2019-08-19 DIAGNOSIS — M19079 Primary osteoarthritis, unspecified ankle and foot: Secondary | ICD-10-CM

## 2019-08-19 DIAGNOSIS — Z9889 Other specified postprocedural states: Secondary | ICD-10-CM

## 2019-08-19 MED ORDER — OXYCODONE-ACETAMINOPHEN 10-325 MG PO TABS: 1.0000 | ORAL_TABLET | ORAL | 0 refills | Status: DC | PRN

## 2019-08-19 NOTE — Progress Notes (Signed)
°  Subjective:  Patient ID: Deborah Knight, female    DOB: Feb 11, 1966,  MRN: [id number]  Chief Complaint  Patient presents with   Routine Post Op    POV#2 DOS 05.26.2021 Tarsal exostectomy rt. Pt states healing well withou any concerns. Pt states some tenderness. Denies fever/chills/nausea/vomiting.    DOS: 08/04/19 Procedure: Midfoot exostectomy right  54 y.o. female presents with the above complaint. History confirmed with patient.   Objective:  Physical Exam: tenderness at the surgical site, local edema noted and calf supple, nontender. Incision: healing well, no significant drainage, no dehiscence, no significant erythema  No images are attached to the encounter.  Radiographs: X-ray of the right foot: no fracture, dislocation, swelling or degenerative changes noted . Consistent with post-op state.  Assessment:   1. Post-operative state   2. Arthritis of midfoot     Plan:  Patient was evaluated and treated and all questions answered.  Post-operative State -Ok to start showering at this time. Advised they cannot soak. -WBAT in CAM boot -Pain medication refilled -F/u in 4 weeks for progression of WB in surgical shoe  No follow-ups on file.

## 2019-08-19 NOTE — Progress Notes (Signed)
  Subjective:  Patient ID: Deborah Knight, female    DOB: 01-Oct-1965,  MRN: [id number]  Chief Complaint  Patient presents with  . Routine Post Op    POV#1 DOS 5.26.2021 TARSAL EXOSTECTOMY RT. Pt states healing well without any concerns. Pt states she has had some occasional chills but denies fever/nausea/vomiting.    DOS: 08/04/19 Procedure: Midfoot exostectomy right  54 y.o. female presents with the above complaint. History confirmed with patient.   Objective:  Physical Exam: tenderness at the surgical site, local edema noted and calf supple, nontender. Incision: healing well, no significant drainage, no dehiscence, no significant erythema  No images are attached to the encounter.  Radiographs: X-ray of the right foot: no fracture, dislocation, swelling or degenerative changes noted . Consistent with post-op state.  Assessment:   1. Arthritis of midfoot   2. Post-operative state     Plan:  Patient was evaluated and treated and all questions answered.  Post-operative State -XR reviewed with patient -Dressing applied consisting of sterile gauze, kerlix and ACE bandage -WBAT in CAM boot -Pain medication refilled  Return in about 1 week (around 08/19/2019).

## 2019-09-02 ENCOUNTER — Other Ambulatory Visit: Payer: Self-pay | Admitting: Family Medicine

## 2019-09-02 DIAGNOSIS — F411 Generalized anxiety disorder: Secondary | ICD-10-CM

## 2019-09-02 NOTE — Telephone Encounter (Signed)
Please review  Thank you

## 2019-09-09 ENCOUNTER — Encounter: Payer: No Typology Code available for payment source | Admitting: Podiatry

## 2019-09-23 ENCOUNTER — Ambulatory Visit (INDEPENDENT_AMBULATORY_CARE_PROVIDER_SITE_OTHER): Payer: No Typology Code available for payment source

## 2019-09-23 ENCOUNTER — Ambulatory Visit: Payer: No Typology Code available for payment source | Admitting: Podiatry

## 2019-09-23 ENCOUNTER — Other Ambulatory Visit: Payer: Self-pay

## 2019-09-23 DIAGNOSIS — M19079 Primary osteoarthritis, unspecified ankle and foot: Secondary | ICD-10-CM

## 2019-09-23 MED ORDER — OXYCODONE-ACETAMINOPHEN 10-325 MG PO TABS: 1.0000 | ORAL_TABLET | ORAL | 0 refills | Status: DC | PRN

## 2019-09-23 NOTE — Progress Notes (Signed)
  Subjective:  Patient ID: Deborah Knight, female    DOB: Jul 17, 1965,  MRN: [id number]  Chief Complaint  Patient presents with  . Routine Post Op    POV#3 DOS 5.26.2021 Tarsal Exostectomy RT. Pt states healing and improvement but she does still have pain/burning which is worse at night.    DOS: 08/04/19 Procedure: Midfoot exostectomy right  54 y.o. female presents with the above complaint. History confirmed with patient.   Objective:  Physical Exam: tenderness at the surgical site, local edema noted and calf supple, nontender. Incision: healing well, no significant drainage, no dehiscence, no significant erythema  No images are attached to the encounter.  Radiographs: X-ray of the right foot: no fracture, dislocation, swelling or degenerative changes noted . Consistent with post-op state.  Assessment:   1. Arthritis of midfoot     Plan:  Patient was evaluated and treated and all questions answered.  Post-operative State -XR reviewed with patient -Ok to start showering at this time. Advised they cannot soak. -WBAT in Surgical shoe -Surgical shoe dispensed -Pain medication refilled -Transition to normal shoegear in 2 weeks  No follow-ups on file.

## 2019-10-29 ENCOUNTER — Ambulatory Visit (INDEPENDENT_AMBULATORY_CARE_PROVIDER_SITE_OTHER): Payer: No Typology Code available for payment source | Admitting: Podiatry

## 2019-10-29 ENCOUNTER — Other Ambulatory Visit: Payer: Self-pay

## 2019-10-29 DIAGNOSIS — Z9889 Other specified postprocedural states: Secondary | ICD-10-CM

## 2019-10-29 DIAGNOSIS — M19079 Primary osteoarthritis, unspecified ankle and foot: Secondary | ICD-10-CM

## 2019-11-04 ENCOUNTER — Telehealth: Payer: Self-pay | Admitting: Podiatry

## 2019-11-04 NOTE — Telephone Encounter (Signed)
Give her another 5 weeks and I'lld discuss when I see her

## 2019-11-04 NOTE — Telephone Encounter (Signed)
Patient needs to have Disability extended, unable to return to work because foot is still painful and swollen. She is not able to wear a shoe at work. Please advise as to how long she will continue out of work.

## 2019-11-06 ENCOUNTER — Telehealth: Payer: Self-pay | Admitting: Podiatry

## 2019-11-06 NOTE — Telephone Encounter (Signed)
Good Morning, I need notes for office visit 10/29/2019. Thank you.

## 2019-11-09 NOTE — Progress Notes (Signed)
  Subjective:  Patient ID: Deborah Knight, female    DOB: 01/20/1966,  MRN: [id number]  Chief Complaint  Patient presents with  . Routine Post Op    4wk POV  POV #3 DOS 08/04/19 TARSAL EXOSTECTOMY RT. Pt states still pain/ discomfort. Also pt started that he foot is still swelling pt has been weqaring compression sock. pt tries to keep foot elevated as much as possible. Pt does continue to ice. Pt would like something for pain.     DOS: 08/04/19 Procedure: Midfoot exostectomy right  54 y.o. female presents with the above complaint. History confirmed with patient.   Objective:  Physical Exam: tenderness at the surgical site and no edema noted. Incision: well healed with thin scar  No images are attached to the encounter.  Assessment:   1. Arthritis of midfoot   2. Post-operative state     Plan:  Patient was evaluated and treated and all questions answered.  Post-operative State -Overall doing well still having pain preventing full activity -Discussed sandals and other shoes that will not irritate the top of the foot. -Continue icing -F/u in 6 weeks  No follow-ups on file.

## 2019-11-09 NOTE — Telephone Encounter (Signed)
Done

## 2019-12-03 ENCOUNTER — Ambulatory Visit (INDEPENDENT_AMBULATORY_CARE_PROVIDER_SITE_OTHER): Payer: No Typology Code available for payment source | Admitting: Podiatry

## 2019-12-03 ENCOUNTER — Encounter: Payer: Self-pay | Admitting: Podiatry

## 2019-12-03 ENCOUNTER — Other Ambulatory Visit: Payer: Self-pay

## 2019-12-03 DIAGNOSIS — S93401A Sprain of unspecified ligament of right ankle, initial encounter: Secondary | ICD-10-CM

## 2019-12-03 DIAGNOSIS — L910 Hypertrophic scar: Secondary | ICD-10-CM

## 2019-12-03 DIAGNOSIS — M19079 Primary osteoarthritis, unspecified ankle and foot: Secondary | ICD-10-CM

## 2019-12-03 DIAGNOSIS — M25774 Osteophyte, right foot: Secondary | ICD-10-CM

## 2019-12-03 DIAGNOSIS — S96911A Strain of unspecified muscle and tendon at ankle and foot level, right foot, initial encounter: Secondary | ICD-10-CM

## 2019-12-03 DIAGNOSIS — Z9889 Other specified postprocedural states: Secondary | ICD-10-CM

## 2019-12-08 NOTE — Progress Notes (Signed)
  Subjective:  Patient ID: Deborah Knight, female    DOB: 11/30/1965,  MRN: [id number]  Chief Complaint  Patient presents with  . Follow-up    Right ankle, swelling and recent fall     DOS: 08/04/19 Procedure: Midfoot exostectomy right  54 y.o. female presents for follow-up.  States that she fell about a week ago has increased pain while ambulating for more than 1 hour states that she still has swelling and pain to the lateral ankle  Objective:  Physical Exam: Pain to palpation about the ATFL, pain to palpation about the superficial aspect of the incision no pain with deep palpation Incision: Well-healed.   Assessment:   1. Arthritis of midfoot   2. Post-operative state   3. Metatarsal boss of right foot   4. Keloid   5. Sprain and strain of right ankle     Plan:  Patient was evaluated and treated and all questions answered.  Post-operative State -Injection delivered to the dorsal incision to reduce scar tissue formation -Dispensed Tri-Lock ankle brace  Procedure: Injection skin lesion Consent: Verbal consent obtained. Location: right dorsal scar Skin Prep: Alcohol. Injectate: 1 cc 0.5% marcaine plain, 0.5 cc kenalog 40. Disposition: Patient tolerated procedure well. Injection site dressed with a band-aid.  Return in about 4 weeks (around 12/31/2019) for Arthritis f/u.

## 2019-12-22 ENCOUNTER — Telehealth: Payer: Self-pay | Admitting: *Deleted

## 2019-12-22 NOTE — Telephone Encounter (Signed)
[place] [employer] w/ [employer] claims is calling to confirm the information sent in concerning patient's  return to work limitations/restrictions, wanted to make sure that everything is accurate. Please contact at [phone].

## 2020-01-04 ENCOUNTER — Encounter: Payer: Self-pay | Admitting: Podiatry

## 2020-01-04 ENCOUNTER — Other Ambulatory Visit: Payer: Self-pay

## 2020-01-04 ENCOUNTER — Ambulatory Visit (INDEPENDENT_AMBULATORY_CARE_PROVIDER_SITE_OTHER): Payer: No Typology Code available for payment source | Admitting: Podiatry

## 2020-01-04 DIAGNOSIS — M24173 Other articular cartilage disorders, unspecified ankle: Secondary | ICD-10-CM

## 2020-01-04 DIAGNOSIS — M249 Joint derangement, unspecified: Secondary | ICD-10-CM

## 2020-01-04 DIAGNOSIS — M19079 Primary osteoarthritis, unspecified ankle and foot: Secondary | ICD-10-CM

## 2020-01-04 DIAGNOSIS — M76821 Posterior tibial tendinitis, right leg: Secondary | ICD-10-CM

## 2020-01-04 DIAGNOSIS — S93401A Sprain of unspecified ligament of right ankle, initial encounter: Secondary | ICD-10-CM

## 2020-01-04 DIAGNOSIS — S96911A Strain of unspecified muscle and tendon at ankle and foot level, right foot, initial encounter: Secondary | ICD-10-CM

## 2020-01-10 NOTE — Progress Notes (Signed)
  Subjective:  Patient ID: Deborah Knight, female    DOB: [birth year],  MRN: [id number]  Chief Complaint  Patient presents with  . Routine Post Op    POV#4 DOS: 08/04/19 Tarsal exostectomy right foot. Pt states she is still in pain, feels that hings are a little worst then before. She is feeling tired and depressed. Pt is crying and very emtional today. Right foot is still swollen. There is dorsal pain, pt couldnt walk on foot for the last couple of days. Pt cant put pressure on foot for more than an hour. Pt has continued to ice and elevate her foot. Pt feels that its worst than it was before.     DOS: 08/04/19 Procedure: Midfoot exostectomy right  54 y.o. female presents for follow-up.  History above confirmed with patient  Objective:  Physical Exam: Pain to palpation about the ATFL, no pain palpation at the incision but pain over the dorsal midfoot medial to this area Incision: Well-healed.   Assessment:   1. Sprain and strain of right ankle   2. Tibial tendonitis, posterior, right   3. Joint derangement of ankle or foot   4. Arthritis of midfoot     Plan:  Patient was evaluated and treated and all questions answered.  Post-operative State -She appears to be having pain that is not at the surgical site but she is very upset about the pain.  Will order MRI to review and consider options with patient.  Ordered MRI we will follow-up in 3 weeks to discuss  Return in about 3 weeks (around 01/25/2020) for MRI review.

## 2020-01-13 ENCOUNTER — Other Ambulatory Visit: Payer: Self-pay

## 2020-01-17 ENCOUNTER — Other Ambulatory Visit: Payer: Self-pay

## 2020-01-25 ENCOUNTER — Ambulatory Visit: Payer: Self-pay | Admitting: Podiatry

## 2020-02-02 ENCOUNTER — Other Ambulatory Visit: Payer: Self-pay

## 2020-02-02 ENCOUNTER — Ambulatory Visit
Admission: RE | Admit: 2020-02-02 | Discharge: 2020-02-02 | Disposition: A | Discharge status: Home / Self Care | Payer: 59 | Source: Ambulatory Visit | Attending: Podiatry | Admitting: Podiatry

## 2020-02-02 DIAGNOSIS — S93401A Sprain of unspecified ligament of right ankle, initial encounter: Secondary | ICD-10-CM

## 2020-02-02 DIAGNOSIS — S96911A Strain of unspecified muscle and tendon at ankle and foot level, right foot, initial encounter: Secondary | ICD-10-CM

## 2020-02-02 DIAGNOSIS — M76821 Posterior tibial tendinitis, right leg: Secondary | ICD-10-CM

## 2020-02-18 ENCOUNTER — Ambulatory Visit (INDEPENDENT_AMBULATORY_CARE_PROVIDER_SITE_OTHER): Payer: 59 | Admitting: Podiatry

## 2020-02-18 ENCOUNTER — Other Ambulatory Visit: Payer: Self-pay

## 2020-02-18 ENCOUNTER — Encounter: Payer: Self-pay | Admitting: Podiatry

## 2020-02-18 DIAGNOSIS — S96911A Strain of unspecified muscle and tendon at ankle and foot level, right foot, initial encounter: Secondary | ICD-10-CM | POA: Diagnosis not present

## 2020-02-18 DIAGNOSIS — S93401A Sprain of unspecified ligament of right ankle, initial encounter: Secondary | ICD-10-CM | POA: Diagnosis not present

## 2020-02-18 DIAGNOSIS — M76821 Posterior tibial tendinitis, right leg: Secondary | ICD-10-CM

## 2020-02-18 DIAGNOSIS — M19079 Primary osteoarthritis, unspecified ankle and foot: Secondary | ICD-10-CM

## 2020-02-18 MED ORDER — DEXAMETHASONE SODIUM PHOSPHATE 120 MG/30ML IJ SOLN
2.0000 mg | Freq: Once | INTRAMUSCULAR | Status: AC
Start: 2020-02-18 — End: 2020-02-18
  Administered 2020-02-18: 2 mg via INTRA_ARTICULAR

## 2020-02-18 NOTE — Progress Notes (Signed)
  Subjective:  Patient ID: Deborah Knight, female    DOB: 21-Jun-1965,  MRN: [id number]  No chief complaint on file.  DOS: 08/04/19 Procedure: Midfoot exostectomy right  54 y.o. female presents for follow-up.  History above confirmed with patient. Here for MRI review.  Objective:  Physical Exam: Pain to palpation about the ATFL, no pain palpation at the incision but pain over the dorsal midfoot medial to this area Incision: Well-healed.  Assessment:   1. Arthritis of midfoot    Plan:  Patient was evaluated and treated and all questions answered.  Post-operative State -Her MRI appears benign and reassuring. She does still have significant midfoot pain. We discussed repeat excision of tarsal bossing vs tarsometatarsal fusion. I think the latter might be a difficult recovery for her given extended NWB. Will extend out of work. -Repeat injection at dorsal midfoot.  Procedure: Joint Injection Location: Right dorsal 2nd TMT joint Skin Prep: Alcohol. Injectate: 0.5 cc 1% lidocaine plain, 0.5 cc dexamethasone phosphate. Disposition: Patient tolerated procedure well. Injection site dressed with a band-aid.    No follow-ups on file.

## 2020-02-25 ENCOUNTER — Other Ambulatory Visit: Payer: Self-pay | Admitting: Family Medicine

## 2020-02-25 DIAGNOSIS — F411 Generalized anxiety disorder: Secondary | ICD-10-CM

## 2020-02-25 NOTE — Telephone Encounter (Signed)
Is this okay to refill? 

## 2020-03-14 ENCOUNTER — Other Ambulatory Visit: Payer: Self-pay

## 2020-03-14 ENCOUNTER — Ambulatory Visit (INDEPENDENT_AMBULATORY_CARE_PROVIDER_SITE_OTHER): Payer: 59 | Admitting: Family Medicine

## 2020-03-14 DIAGNOSIS — Z23 Encounter for immunization: Secondary | ICD-10-CM

## 2020-03-14 NOTE — Progress Notes (Signed)
Pt came in today to receive a flu vaccine , pt receive vaccine , w/o any concerns. Pt also wanted to discuss getting a shingles vaccine. Advised pt that she will need to get the vaccine from her pharmacy will fax order to [facility] per patient request.

## 2020-03-22 ENCOUNTER — Other Ambulatory Visit: Payer: Self-pay | Admitting: Family Medicine

## 2020-03-22 DIAGNOSIS — I10 Essential (primary) hypertension: Secondary | ICD-10-CM

## 2020-03-24 ENCOUNTER — Encounter: Payer: Self-pay | Admitting: Podiatry

## 2020-03-24 ENCOUNTER — Ambulatory Visit: Payer: 59 | Admitting: Podiatry

## 2020-03-24 ENCOUNTER — Other Ambulatory Visit: Payer: Self-pay

## 2020-03-24 DIAGNOSIS — E559 Vitamin D deficiency, unspecified: Secondary | ICD-10-CM | POA: Diagnosis not present

## 2020-03-24 DIAGNOSIS — M19079 Primary osteoarthritis, unspecified ankle and foot: Secondary | ICD-10-CM

## 2020-03-24 DIAGNOSIS — M25774 Osteophyte, right foot: Secondary | ICD-10-CM | POA: Diagnosis not present

## 2020-03-24 NOTE — Progress Notes (Signed)
  Subjective:  Patient ID: Deborah Knight, female    DOB: 1965/12/22,  MRN: [id number]  Chief Complaint  Patient presents with  . Follow-up    PT STATES SHE IS STILL HAVING PAIN AND WANTS TO INQUIRE ABOUT HAVING SURGERY.    DOS: 08/04/19 Procedure: Midfoot exostectomy right  55 y.o. female presents for follow-up.  History above confirmed with patient. Here for MRI review.  Objective:  Physical Exam: Pain to palpation about the ATFL, no pain palpation at the incision but pain over the dorsal midfoot medial to this area Incision: Well-healed.  Assessment:   1. Arthritis of midfoot   2. Metatarsal boss of right foot   3. Vitamin D deficiency    Plan:  Patient was evaluated and treated and all questions answered.  Post-operative State -She has continued pain. We did discuss proceeding with further exostectomy.  We did discuss as an option doing tarsometatarsal fusion however I believe that the nonweightbearing course would be difficult for her.  Patient concurs.  Consent form reviewed and signed the patient -Patient has failed all conservative therapy and wishes to proceed with surgical intervention. All risks, benefits, and alternatives discussed with patient. No guarantees given. Consent reviewed and signed by patient. -Planned procedures: Midfoot exostectomy right -Identified risk factors: Vit D deficiency. -DME dispensed for post-op use: none    No follow-ups on file.

## 2020-03-29 ENCOUNTER — Telehealth: Payer: Self-pay | Admitting: Podiatry

## 2020-03-29 NOTE — Telephone Encounter (Signed)
I think Deborah Knight should have the surgery date - but yes we can continue out of work

## 2020-03-29 NOTE — Telephone Encounter (Signed)
This pt has been out of work for quite sometime, I see that she may be having surgery, no surgery date yet. I was just wondering about how much time she will be out. Im actually not sure if she is working, but she does have Disability paperwork to be completed.

## 2020-05-05 ENCOUNTER — Ambulatory Visit
Admission: EM | Admit: 2020-05-05 | Discharge: 2020-05-05 | Disposition: A | Discharge status: Home / Self Care | Payer: 59 | Attending: Family Medicine | Admitting: Family Medicine

## 2020-05-05 ENCOUNTER — Encounter: Payer: Self-pay | Admitting: Emergency Medicine

## 2020-05-05 ENCOUNTER — Other Ambulatory Visit: Payer: Self-pay

## 2020-05-05 DIAGNOSIS — S161XXA Strain of muscle, fascia and tendon at neck level, initial encounter: Secondary | ICD-10-CM

## 2020-05-05 DIAGNOSIS — S39012A Strain of muscle, fascia and tendon of lower back, initial encounter: Secondary | ICD-10-CM | POA: Diagnosis not present

## 2020-05-05 DIAGNOSIS — M25512 Pain in left shoulder: Secondary | ICD-10-CM | POA: Diagnosis not present

## 2020-05-05 MED ORDER — DEXAMETHASONE SODIUM PHOSPHATE 10 MG/ML IJ SOLN
10.0000 mg | Freq: Once | INTRAMUSCULAR | Status: AC
  Administered 2020-05-05: 10 mg via INTRAMUSCULAR
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
## 2020-05-10 ENCOUNTER — Telehealth: Payer: Self-pay
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
## 2020-05-30 ENCOUNTER — Encounter: Payer: 59 | Admitting: Podiatry

## 2020-06-12 ENCOUNTER — Telehealth: Payer: Self-pay | Admitting: Urology

## 2020-06-12 NOTE — Telephone Encounter (Addendum)
DOS - 08/09/20  TARSAL EXOSTECTOMY RIGHT --- 11941  [facility] EFFECTIVE DATE - 08/10/19   RECEIVED A FAX THAT CPT CODE 74081 DOESN'T REQUIRE PRIOR AUTH, GOOD FROM 06/08/2020 - 09/06/2020

## 2020-06-15 ENCOUNTER — Telehealth: Payer: Self-pay | Admitting: Podiatry

## 2020-06-15 NOTE — Telephone Encounter (Signed)
Mrs. Deborah Knight has rescheduled her surgery until 08/09/2020, please advise on work status. If she continues out of work, please make progress note as detailed as possible. They are giving me a hard time about her work status. Thank you

## 2020-06-16 ENCOUNTER — Encounter: Payer: 59 | Admitting: Podiatry

## 2020-06-28 ENCOUNTER — Encounter: Payer: Self-pay | Admitting: Podiatry

## 2020-07-04 ENCOUNTER — Encounter: Payer: 59 | Admitting: Podiatry

## 2020-07-13 ENCOUNTER — Encounter: Payer: Self-pay | Admitting: Podiatry

## 2020-07-18 ENCOUNTER — Encounter: Payer: 59 | Admitting: Podiatry

## 2020-08-09 ENCOUNTER — Other Ambulatory Visit: Payer: Self-pay | Admitting: Podiatry

## 2020-08-09 DIAGNOSIS — M19071 Primary osteoarthritis, right ankle and foot: Secondary | ICD-10-CM | POA: Diagnosis not present

## 2020-08-09 MED ORDER — OXYCODONE-ACETAMINOPHEN 10-325 MG PO TABS: 1.0000 | ORAL_TABLET | ORAL | 0 refills | Status: DC | PRN

## 2020-08-09 MED ORDER — CEPHALEXIN 500 MG PO CAPS: ORAL_CAPSULE | ORAL | 0 refills | Status: DC

## 2020-08-09 NOTE — Progress Notes (Signed)
Rx sent to pharmacy for outpatient surgery. °

## 2020-08-14 ENCOUNTER — Telehealth: Payer: Self-pay | Admitting: Podiatry

## 2020-08-14 NOTE — Telephone Encounter (Signed)
Yes no changes to post-op care at this point

## 2020-08-14 NOTE — Telephone Encounter (Signed)
Patient is having to be r/s but she wanted to know if she needs to continue sponge bath and keep following the instructions until next visit on 06/14

## 2020-08-15 ENCOUNTER — Encounter: Payer: 59 | Admitting: Podiatry

## 2020-08-22 ENCOUNTER — Encounter: Payer: 59 | Admitting: Podiatry

## 2020-08-25 ENCOUNTER — Telehealth: Payer: Self-pay | Admitting: Podiatry

## 2020-08-25 ENCOUNTER — Other Ambulatory Visit: Payer: Self-pay | Admitting: Sports Medicine

## 2020-08-25 ENCOUNTER — Other Ambulatory Visit: Payer: Self-pay | Admitting: Family Medicine

## 2020-08-25 DIAGNOSIS — F411 Generalized anxiety disorder: Secondary | ICD-10-CM

## 2020-08-25 MED ORDER — OXYCODONE-ACETAMINOPHEN 10-325 MG PO TABS: 1.0000 | ORAL_TABLET | ORAL | 0 refills | Status: DC | PRN

## 2020-08-25 NOTE — Progress Notes (Signed)
Refilled pain medication

## 2020-08-25 NOTE — Telephone Encounter (Signed)
Patient would like a refill of her pain medication °

## 2020-08-29 ENCOUNTER — Ambulatory Visit (INDEPENDENT_AMBULATORY_CARE_PROVIDER_SITE_OTHER): Payer: 59 | Admitting: Podiatry

## 2020-08-29 ENCOUNTER — Other Ambulatory Visit: Payer: Self-pay

## 2020-08-29 DIAGNOSIS — M19079 Primary osteoarthritis, unspecified ankle and foot: Secondary | ICD-10-CM

## 2020-08-29 DIAGNOSIS — M25774 Osteophyte, right foot: Secondary | ICD-10-CM

## 2020-08-29 DIAGNOSIS — Z9889 Other specified postprocedural states: Secondary | ICD-10-CM

## 2020-08-29 NOTE — Progress Notes (Signed)
  Subjective:  Patient ID: Deborah Knight, female    DOB: 1966/03/08,  MRN: [id number]  Chief Complaint  Patient presents with   Routine Post Op    POV #1 DOS 08/09/2020 RT FOOT REMOVAL OF BONE SPUR & ARTHRITIC BONE DORDAL ASPECT OF FOOT. No NV, fever or chills. Pt complains of numbness in toes 1-3 and soreness in toes 4 and 5. Pt also complains of burning sensations in surgical area.     DOS: 08/09/20 Procedure: Midfoot exostectomy right   55 y.o. female presents with the above complaint. History confirmed with patient.   Objective:  Physical Exam: tenderness at the surgical site, local edema noted, and calf supple, nontender. Tinel's sign over incision with paresthesias of 2nd/3rd toes Incision: healing well, no significant drainage, no dehiscence, no significant erythema  Assessment:   1. Arthritis of midfoot   2. Metatarsal boss of right foot   3. Post-operative state     Plan:  Patient was evaluated and treated and all questions answered.  Post-operative State -XR reviewed with patient -Ok to start showering at this time. Advised they cannot soak. -Dressing applied consisting of povidone, band-aid, and ACE bandage -WBAT in CAM boot -Discussed nerve symptoms should improve with time. -XRs needed at follow-up: 3 view Foot   Return in about 2 weeks (around 09/12/2020) for Post-Op (with XRs).

## 2020-09-06 ENCOUNTER — Telehealth: Payer: Self-pay | Admitting: Podiatry

## 2020-09-06 NOTE — Telephone Encounter (Signed)
Pt called requesting a refill on pain meds. Please advise.

## 2020-09-07 ENCOUNTER — Telehealth: Payer: Self-pay | Admitting: Podiatry

## 2020-09-07 MED ORDER — OXYCODONE-ACETAMINOPHEN 10-325 MG PO TABS: 1.0000 | ORAL_TABLET | ORAL | 0 refills | Status: DC | PRN

## 2020-09-07 NOTE — Telephone Encounter (Signed)
Pt called back again requesting a refill on pain meds. Please advise.

## 2020-09-07 NOTE — Addendum Note (Signed)
Addended by: Ventura Sellers on: 09/07/2020 05:07 PM   Modules accepted: Orders

## 2020-09-08 DIAGNOSIS — M79676 Pain in unspecified toe(s): Secondary | ICD-10-CM

## 2020-09-15 ENCOUNTER — Ambulatory Visit (INDEPENDENT_AMBULATORY_CARE_PROVIDER_SITE_OTHER): Payer: 59 | Admitting: Podiatry

## 2020-09-15 ENCOUNTER — Ambulatory Visit (INDEPENDENT_AMBULATORY_CARE_PROVIDER_SITE_OTHER): Payer: 59

## 2020-09-15 ENCOUNTER — Other Ambulatory Visit: Payer: Self-pay

## 2020-09-15 DIAGNOSIS — Z9889 Other specified postprocedural states: Secondary | ICD-10-CM

## 2020-09-15 MED ORDER — OXYCODONE-ACETAMINOPHEN 10-325 MG PO TABS: 1.0000 | ORAL_TABLET | ORAL | 0 refills | Status: DC | PRN

## 2020-09-15 NOTE — Progress Notes (Signed)
  Subjective:  Patient ID: Deborah Knight, female    DOB: Apr 09, 1965,  MRN: [id number]  Chief Complaint  Patient presents with   Routine Post Op    Patient denies nausea, vomiting, fever and chills since last office visit. Patient is concern about still having significant pain near the surgical site and the numbness in the toes.     DOS: 08/09/20 Procedure: Midfoot exostectomy right   55 y.o. female presents with the above complaint. History confirmed with patient.    Objective:  Physical Exam: tenderness at the surgical site, local edema noted, and calf supple, nontender. Tinel's sign over incision with paresthesias of 2nd/3rd toes Incision: healing well, no significant drainage, no dehiscence, no significant erythema  Assessment:   1. Post-operative state     Plan:  Patient was evaluated and treated and all questions answered.  Post-operative State -Dressing applied consisting of povidone, band-aid, and ACE bandage -Surgical shoe dispensed -Pain medication refilled -Discussed nerve symptoms should improve with time. -XRs needed at follow-up: none  Return in about 1 month (around 10/16/2020) for Post-Op (No XRs).

## 2020-09-26 ENCOUNTER — Telehealth: Payer: Self-pay | Admitting: Podiatry

## 2020-09-26 DIAGNOSIS — Z9889 Other specified postprocedural states: Secondary | ICD-10-CM

## 2020-09-26 MED ORDER — OXYCODONE-ACETAMINOPHEN 10-325 MG PO TABS: 1.0000 | ORAL_TABLET | ORAL | 0 refills | Status: DC | PRN

## 2020-09-26 NOTE — Telephone Encounter (Signed)
Patient calling to request pain medication

## 2020-09-26 NOTE — Addendum Note (Signed)
Addended by: Ventura Sellers on: 09/26/2020 10:16 AM   Modules accepted: Orders

## 2020-10-04 ENCOUNTER — Telehealth: Payer: Self-pay | Admitting: Podiatry

## 2020-10-04 NOTE — Telephone Encounter (Signed)
Patient is requesting a refill on pain medication.

## 2020-10-05 MED ORDER — OXYCODONE-ACETAMINOPHEN 10-325 MG PO TABS: 1.0000 | ORAL_TABLET | ORAL | 0 refills | Status: DC | PRN

## 2020-10-05 NOTE — Addendum Note (Signed)
Addended by: Ventura Sellers on: 10/05/2020 01:16 PM   Modules accepted: Orders

## 2020-10-13 ENCOUNTER — Telehealth: Payer: Self-pay | Admitting: Podiatry

## 2020-10-13 MED ORDER — OXYCODONE-ACETAMINOPHEN 10-325 MG PO TABS: 1.0000 | ORAL_TABLET | ORAL | 0 refills | Status: DC | PRN

## 2020-10-13 NOTE — Telephone Encounter (Signed)
Patient called to request pain meds 

## 2020-10-13 NOTE — Addendum Note (Signed)
Addended by: Ventura Sellers on: 10/13/2020 08:39 AM   Modules accepted: Orders

## 2020-10-17 ENCOUNTER — Ambulatory Visit (INDEPENDENT_AMBULATORY_CARE_PROVIDER_SITE_OTHER): Payer: 59 | Admitting: Podiatry

## 2020-10-17 ENCOUNTER — Other Ambulatory Visit: Payer: Self-pay

## 2020-10-17 DIAGNOSIS — M19079 Primary osteoarthritis, unspecified ankle and foot: Secondary | ICD-10-CM

## 2020-10-17 DIAGNOSIS — Z9889 Other specified postprocedural states: Secondary | ICD-10-CM

## 2020-10-17 NOTE — Progress Notes (Signed)
  Subjective:  Patient ID: Deborah Knight, female    DOB: [birth year],  MRN: [id number]  Chief Complaint  Patient presents with   Routine Post Op    Patient denies nausea, vomiting, fever and chills. Patient reports still having pain with normal activity, swelling in the foot and numbness. Patient states she is still taking pain medication as prescribed.    DOS: 08/09/20 Procedure: Midfoot exostectomy right   55 y.o. female presents with the above complaint. History confirmed with patient.    Objective:  Physical Exam: tenderness at the surgical site, local edema noted, and calf supple, nontender. Tinel's sign over incision with paresthesias of 2nd/3rd toes. Large soft tissue prominence likely representing scar tissue. Incision: Well healed with mildly hypertrophic scar.  Assessment:   1. Arthritis of midfoot   2. Post-operative state    Plan:  Patient was evaluated and treated and all questions answered.  Post-operative State -She does have some significant scar tissue formation. Refer to PT for scar tissue massage, strengthening.  -Will extend out of work for continued pain and inability to wear normal shoes. -XRs needed at follow-up: none  Return in about 6 weeks (around 11/28/2020) for Arthritis f/u.

## 2020-10-23 ENCOUNTER — Telehealth: Payer: Self-pay | Admitting: Podiatry

## 2020-10-23 MED ORDER — OXYCODONE-ACETAMINOPHEN 10-325 MG PO TABS: 1.0000 | ORAL_TABLET | ORAL | 0 refills | Status: DC | PRN

## 2020-10-23 NOTE — Addendum Note (Signed)
Addended by: Ventura Sellers on: 10/23/2020 01:31 PM   Modules accepted: Orders

## 2020-10-23 NOTE — Telephone Encounter (Signed)
Pt called requesting pain medication. She states if it doesn't get better in a few days, she will make a sooner appt to see you. Please advise.

## 2020-10-24 ENCOUNTER — Other Ambulatory Visit: Payer: Self-pay | Admitting: Family Medicine

## 2020-10-24 DIAGNOSIS — I10 Essential (primary) hypertension: Secondary | ICD-10-CM

## 2020-11-01 ENCOUNTER — Telehealth: Payer: Self-pay | Admitting: Podiatry

## 2020-11-01 NOTE — Telephone Encounter (Signed)
Patient is requesting a refill on pain medication.

## 2020-11-02 MED ORDER — OXYCODONE-ACETAMINOPHEN 10-325 MG PO TABS: 1.0000 | ORAL_TABLET | ORAL | 0 refills | Status: DC | PRN

## 2020-11-02 NOTE — Addendum Note (Signed)
Addended by: Ventura Sellers on: 11/02/2020 05:44 PM   Modules accepted: Orders

## 2020-11-14 ENCOUNTER — Telehealth: Payer: Self-pay | Admitting: Podiatry

## 2020-11-14 ENCOUNTER — Ambulatory Visit: Payer: 59 | Admitting: Physical Therapy

## 2020-11-14 MED ORDER — OXYCODONE-ACETAMINOPHEN 10-325 MG PO TABS: 1.0000 | ORAL_TABLET | ORAL | 0 refills | Status: DC | PRN

## 2020-11-14 NOTE — Telephone Encounter (Signed)
Patient would like a refill on pain medication °

## 2020-11-16 ENCOUNTER — Ambulatory Visit: Payer: 59 | Admitting: Physical Therapy

## 2020-11-24 ENCOUNTER — Telehealth: Payer: Self-pay | Admitting: Podiatry

## 2020-11-24 NOTE — Telephone Encounter (Signed)
Patient called the office concerned about pain medication.

## 2020-11-28 ENCOUNTER — Other Ambulatory Visit: Payer: Self-pay

## 2020-11-28 ENCOUNTER — Ambulatory Visit (INDEPENDENT_AMBULATORY_CARE_PROVIDER_SITE_OTHER): Payer: 59 | Admitting: Podiatry

## 2020-11-28 ENCOUNTER — Telehealth: Payer: Self-pay | Admitting: Podiatry

## 2020-11-28 DIAGNOSIS — M19079 Primary osteoarthritis, unspecified ankle and foot: Secondary | ICD-10-CM

## 2020-11-28 DIAGNOSIS — M25774 Osteophyte, right foot: Secondary | ICD-10-CM | POA: Diagnosis not present

## 2020-11-28 MED ORDER — OXYCODONE-ACETAMINOPHEN 10-325 MG PO TABS: 1.0000 | ORAL_TABLET | ORAL | 0 refills | Status: DC | PRN

## 2020-11-28 NOTE — Addendum Note (Signed)
Addended by: Ventura Sellers on: 11/28/2020 08:13 AM   Modules accepted: Orders

## 2020-11-28 NOTE — Progress Notes (Signed)
  Subjective:  Patient ID: Deborah Knight, female    DOB: [birth year],  MRN: [id number]  Chief Complaint  Patient presents with   Arthritis    6 weeks follow up for Arthritis    DOS: 08/09/20 Procedure: Midfoot exostectomy right   55 y.o. female presents with the above complaint. History confirmed with patient.    Objective:  Physical Exam: tenderness at the surgical site, local edema noted, and calf supple, nontender. Tinel's sign over incision with paresthesias of 2nd/3rd toes. Large soft tissue prominence likely representing scar tissue. Incision: Well healed with mildly hypertrophic scar.  Assessment:   1. Arthritis of midfoot   2. Metatarsal boss of right foot     Plan:  Patient was evaluated and treated and all questions answered.  Post-operative State -Significant scar tissue remains. Unfortunately she was not able to go to PT due to cost. Advised she see if another PT is covered by insurance and then should she require a referral I would be happy to send it over. Unfortunately this will take time to resolve. F/u in  6 weeks.  Return in about 6 weeks (around 01/09/2021) for Arthritis.

## 2020-11-28 NOTE — Telephone Encounter (Signed)
Called the patient to let her know that her RX has been sent.  

## 2020-12-07 NOTE — Telephone Encounter (Signed)
error 

## 2020-12-11 ENCOUNTER — Telehealth: Payer: Self-pay | Admitting: Podiatry

## 2020-12-12 MED ORDER — OXYCODONE-ACETAMINOPHEN 10-325 MG PO TABS: 1.0000 | ORAL_TABLET | ORAL | 0 refills | Status: DC | PRN

## 2020-12-12 NOTE — Addendum Note (Signed)
Addended by: Ventura Sellers on: 12/12/2020 01:29 PM   Modules accepted: Orders

## 2020-12-12 NOTE — Telephone Encounter (Signed)
Pt is requesting a refill of her pain medication.

## 2020-12-25 ENCOUNTER — Telehealth: Payer: Self-pay | Admitting: Podiatry

## 2020-12-25 DIAGNOSIS — M19079 Primary osteoarthritis, unspecified ankle and foot: Secondary | ICD-10-CM

## 2020-12-25 NOTE — Telephone Encounter (Signed)
Patient called the office requesting pain medication.    Please advise  

## 2020-12-26 ENCOUNTER — Telehealth: Payer: Self-pay | Admitting: Podiatry

## 2020-12-26 NOTE — Telephone Encounter (Signed)
Dr. Samuella Cota please advice about RF

## 2020-12-27 ENCOUNTER — Telehealth: Payer: Self-pay | Admitting: Podiatry

## 2020-12-27 MED ORDER — OXYCODONE-ACETAMINOPHEN 5-325 MG PO TABS: 1.0000 | ORAL_TABLET | ORAL | 0 refills | Status: DC | PRN

## 2020-12-27 NOTE — Telephone Encounter (Signed)
Patient called the office requesting a refill of pain medication. ? ? ? ?Please advise  ?

## 2020-12-27 NOTE — Addendum Note (Signed)
Addended by: Ventura Sellers on: 12/27/2020 05:20 PM   Modules accepted: Orders

## 2020-12-27 NOTE — Telephone Encounter (Signed)
Called the patient back and left and voicemail to call the office back at her earliest convince.

## 2020-12-28 NOTE — Telephone Encounter (Signed)
Contacted pt and LVM  to inform of RF sent to pharmacy.

## 2020-12-29 ENCOUNTER — Telehealth: Payer: Self-pay

## 2020-12-29 NOTE — Telephone Encounter (Signed)
Pt is asking for refills  Alprazolam Bp pills   [facility]

## 2020-12-30 ENCOUNTER — Other Ambulatory Visit: Payer: Self-pay | Admitting: Family Medicine

## 2020-12-30 DIAGNOSIS — I10 Essential (primary) hypertension: Secondary | ICD-10-CM

## 2021-01-01 ENCOUNTER — Other Ambulatory Visit: Payer: Self-pay | Admitting: Family Medicine

## 2021-01-01 DIAGNOSIS — F411 Generalized anxiety disorder: Secondary | ICD-10-CM

## 2021-01-01 DIAGNOSIS — I10 Essential (primary) hypertension: Secondary | ICD-10-CM

## 2021-01-01 MED ORDER — AMLODIPINE BESYLATE 10 MG PO TABS: 10.0000 mg | ORAL_TABLET | Freq: Every day | ORAL | 0 refills | Status: DC

## 2021-01-01 MED ORDER — ALPRAZOLAM 0.25 MG PO TABS: 0.2500 mg | ORAL_TABLET | Freq: Two times a day (BID) | ORAL | 0 refills | Status: DC | PRN

## 2021-01-01 MED ORDER — METOPROLOL TARTRATE 25 MG PO TABS: 12.5000 mg | ORAL_TABLET | Freq: Two times a day (BID) | ORAL | 0 refills | Status: DC

## 2021-01-01 NOTE — Progress Notes (Signed)
Reviewed PDMP substance reporting system prior to prescribing opiate medications. No inconsistencies noted.    Meds ordered this encounter  Medications   ALPRAZolam (XANAX) 0.25 MG tablet    Sig: Take 1 tablet (0.25 mg total) by mouth 2 (two) times daily as needed.    Dispense:  20 tablet    Refill:  0    Order Specific Question:   Supervising Provider    Answer:   Quentin Angst [[id number]]   amLODipine (NORVASC) 10 MG tablet    Sig: Take 1 tablet (10 mg total) by mouth daily.    Dispense:  30 tablet    Refill:  0    Order Specific Question:   Supervising Provider    Answer:   Quentin Angst [id number]   metoprolol tartrate (LOPRESSOR) 25 MG tablet    Sig: Take 0.5 tablets (12.5 mg total) by mouth 2 (two) times daily.    Dispense:  60 tablet    Refill:  0    Order Specific Question:   Supervising Provider    Answer:   Quentin Angst [[id number]]   Nolon Nations  APRN, MSN, FNP-C Patient Care [facility] [address] [place], [place] [address] [phone]

## 2021-01-09 ENCOUNTER — Ambulatory Visit (INDEPENDENT_AMBULATORY_CARE_PROVIDER_SITE_OTHER): Payer: 59 | Admitting: Podiatry

## 2021-01-09 ENCOUNTER — Other Ambulatory Visit: Payer: Self-pay

## 2021-01-09 DIAGNOSIS — M19071 Primary osteoarthritis, right ankle and foot: Secondary | ICD-10-CM

## 2021-01-09 DIAGNOSIS — M19079 Primary osteoarthritis, unspecified ankle and foot: Secondary | ICD-10-CM

## 2021-01-09 MED ORDER — DEXAMETHASONE SODIUM PHOSPHATE 120 MG/30ML IJ SOLN
4.0000 mg | Freq: Once | INTRAMUSCULAR | Status: AC
Start: 2021-01-09 — End: 2021-01-09
  Administered 2021-01-09: 4 mg via INTRA_ARTICULAR

## 2021-01-09 NOTE — Progress Notes (Signed)
  Subjective:  Patient ID: Deborah Knight, female    DOB: 09-07-1965,  MRN: [id number]  Chief Complaint  Patient presents with   Arthritis    Patient is here for arthritis of the midfoot. Patient is complaining of foot pain, numbness, stinging, and swelling.   DOS: 08/09/20 Procedure: Midfoot exostectomy right   55 y.o. female presents with the above complaint. History confirmed with patient.    Objective:  Physical Exam: tenderness at the surgical site, local edema noted, and calf supple, nontender. Tinel's sign over incision with paresthesias of 2nd/3rd toes. Large soft tissue prominence likely representing scar tissue. Incision: Well healed with mildly hypertrophic scar.  Assessment:   1. Arthritis of midfoot    Plan:  Patient was evaluated and treated and all questions answered.  Midfoot arthritis -Again with significant scar tissue. This area was injected today to help break this down. Discussed use of massage daily to decrease pain and sensitivity. F/u in 6 weeks for recheck  Procedure: Joint Injection Location: Right 2nd TMT joint Skin Prep: Alcohol. Injectate: 0.5 cc 1% lidocaine plain, 0.5 cc betamethasone acetate-betamethasone sodium phosphate Disposition: Patient tolerated procedure well. Injection site dressed with a band-aid.   Return in about 6 weeks (around 02/20/2021) for Arthritis.

## 2021-01-25 ENCOUNTER — Ambulatory Visit: Payer: Self-pay | Admitting: Family Medicine

## 2021-02-06 ENCOUNTER — Ambulatory Visit: Payer: Self-pay | Admitting: Family Medicine

## 2021-02-20 ENCOUNTER — Other Ambulatory Visit: Payer: Self-pay

## 2021-02-20 ENCOUNTER — Ambulatory Visit (INDEPENDENT_AMBULATORY_CARE_PROVIDER_SITE_OTHER): Payer: 59

## 2021-02-20 ENCOUNTER — Ambulatory Visit (INDEPENDENT_AMBULATORY_CARE_PROVIDER_SITE_OTHER): Payer: 59 | Admitting: Podiatry

## 2021-02-20 DIAGNOSIS — M19071 Primary osteoarthritis, right ankle and foot: Secondary | ICD-10-CM | POA: Diagnosis not present

## 2021-02-20 DIAGNOSIS — M19079 Primary osteoarthritis, unspecified ankle and foot: Secondary | ICD-10-CM

## 2021-02-20 MED ORDER — TRIAMCINOLONE ACETONIDE 40 MG/ML IJ SUSP
40.0000 mg | Freq: Once | INTRAMUSCULAR | Status: AC
  Administered 2021-02-20: 40 mg

## 2021-02-20 NOTE — Progress Notes (Signed)
°  Subjective:  Patient ID: Deborah Knight, female    DOB: [birth year],  MRN: [id number]  Chief Complaint  Patient presents with   Follow-up      arthritis   DOS: 08/09/20 Procedure: Midfoot exostectomy right   55 y.o. female presents with the above complaint. History confirmed with patient.  Still feels like she has a lot of pain worst in the AM. Prevents walking and other activity. States the previous injection helped for about 4 days. Has been trying to massage the area.  Objective:  Physical Exam: tenderness at the surgical site, local edema noted, and calf supple, nontender. Tinel's sign over incision with paresthesias of 2nd/3rd toes. Large soft tissue prominence likely representing scar tissue. Incision: Well healed with mildly hypertrophic scar.  Assessment:   1. Arthritis of midfoot    Plan:  Patient was evaluated and treated and all questions answered.  Midfoot arthritis -Repeat XR today. Arthritic changes but local edema concerning for scar tissue -Should this persist consider MRI, could consider midfoot fusion of arthritic joints. -Repeat injection today  Procedure: Joint Injection Location: Right 2nd TMT joint Skin Prep: Alcohol. Injectate: 0.5 cc 1% lidocaine plain, 1 cc Kenalog 40 Disposition: Patient tolerated procedure well. Injection site dressed with a band-aid.  Return in about 6 weeks (around 04/03/2021) for Arthritis.

## 2021-03-19 ENCOUNTER — Other Ambulatory Visit: Payer: Self-pay | Admitting: Family Medicine

## 2021-03-19 DIAGNOSIS — I10 Essential (primary) hypertension: Secondary | ICD-10-CM

## 2021-03-20 ENCOUNTER — Ambulatory Visit: Payer: Self-pay | Admitting: Family Medicine

## 2021-03-27 ENCOUNTER — Other Ambulatory Visit: Payer: Self-pay | Admitting: Family Medicine

## 2021-03-27 DIAGNOSIS — I10 Essential (primary) hypertension: Secondary | ICD-10-CM

## 2021-03-29 ENCOUNTER — Other Ambulatory Visit: Payer: Self-pay

## 2021-03-29 DIAGNOSIS — I10 Essential (primary) hypertension: Secondary | ICD-10-CM

## 2021-03-29 MED ORDER — HYDROCHLOROTHIAZIDE 12.5 MG PO TABS: 12.5000 mg | ORAL_TABLET | Freq: Every day | ORAL | 0 refills | Status: DC

## 2021-04-03 ENCOUNTER — Other Ambulatory Visit: Payer: Self-pay

## 2021-04-03 ENCOUNTER — Ambulatory Visit: Payer: Self-pay | Admitting: Podiatry

## 2021-04-10 ENCOUNTER — Other Ambulatory Visit: Payer: Self-pay

## 2021-04-10 ENCOUNTER — Ambulatory Visit (INDEPENDENT_AMBULATORY_CARE_PROVIDER_SITE_OTHER): Payer: Self-pay | Admitting: Podiatry

## 2021-04-10 DIAGNOSIS — Z91199 Patient's noncompliance with other medical treatment and regimen due to unspecified reason: Secondary | ICD-10-CM

## 2021-04-10 NOTE — Progress Notes (Signed)
Error

## 2021-04-13 ENCOUNTER — Ambulatory Visit: Payer: Self-pay | Admitting: Podiatry

## 2021-04-17 ENCOUNTER — Encounter: Payer: Self-pay | Admitting: Podiatry

## 2021-04-17 ENCOUNTER — Ambulatory Visit (INDEPENDENT_AMBULATORY_CARE_PROVIDER_SITE_OTHER): Payer: BLUE CROSS/BLUE SHIELD | Admitting: Podiatry

## 2021-04-17 ENCOUNTER — Other Ambulatory Visit: Payer: Self-pay

## 2021-04-17 DIAGNOSIS — M19079 Primary osteoarthritis, unspecified ankle and foot: Secondary | ICD-10-CM

## 2021-04-17 DIAGNOSIS — M19071 Primary osteoarthritis, right ankle and foot: Secondary | ICD-10-CM | POA: Diagnosis not present

## 2021-04-17 MED ORDER — BETAMETHASONE SOD PHOS & ACET 6 (3-3) MG/ML IJ SUSP
6.0000 mg | Freq: Once | INTRAMUSCULAR | Status: AC
  Administered 2021-04-17: 6 mg

## 2021-04-17 NOTE — Progress Notes (Signed)
°  Subjective:  Patient ID: Deborah Knight, female    DOB: September 09, 1965,  MRN: [id number]  Chief Complaint  Patient presents with   Follow-up    Arthritis of midfoot right   DOS: 08/09/20 Procedure: Midfoot exostectomy right   56 y.o. female presents with the above complaint. History confirmed with patient.  Feels like the injection helped a lot but pain recurred when it wore off. Has been trying to massage the area as directed.  Objective:  Physical Exam: tenderness at the surgical site, local edema noted, and calf supple, nontender. Tinel's sign over incision with paresthesias of 2nd/3rd toes. Large soft tissue prominence likely representing scar tissue. Incision: Well healed with mildly hypertrophic scar.  Assessment:   1. Arthritis of midfoot    Plan:  Patient was evaluated and treated and all questions answered.  Midfoot arthritis -Repeat injection to midfoot per patient request. Injection given deep to the joint and additionally along incision to reduce scar tissue. -Should this persist consider MRI, could consider midfoot fusion of arthritic joints..  Procedure: Joint Injection Location: Right midfoot joint Skin Prep: Alcohol. Injectate: 1 cc 1% lidocaine plain, 1 cc betamethasone acetate-betamethasone sodium phosphate Disposition: Patient tolerated procedure well. Injection site dressed with a band-aid.  No follow-ups on file.

## 2021-04-22 ENCOUNTER — Other Ambulatory Visit: Payer: Self-pay | Admitting: Family Medicine

## 2021-04-22 DIAGNOSIS — I10 Essential (primary) hypertension: Secondary | ICD-10-CM

## 2021-04-30 ENCOUNTER — Telehealth: Payer: Self-pay

## 2021-04-30 DIAGNOSIS — I10 Essential (primary) hypertension: Secondary | ICD-10-CM

## 2021-04-30 MED ORDER — AMLODIPINE BESYLATE 10 MG PO TABS: 10.0000 mg | ORAL_TABLET | Freq: Every day | ORAL | 0 refills | Status: DC

## 2021-04-30 NOTE — Telephone Encounter (Signed)
Amlodipine   Karin Golden in [facility]

## 2021-04-30 NOTE — Telephone Encounter (Signed)
Refill sent.

## 2021-05-22 ENCOUNTER — Encounter: Payer: Self-pay | Admitting: Podiatry

## 2021-05-22 ENCOUNTER — Ambulatory Visit (INDEPENDENT_AMBULATORY_CARE_PROVIDER_SITE_OTHER): Payer: BLUE CROSS/BLUE SHIELD | Admitting: Podiatry

## 2021-05-22 ENCOUNTER — Ambulatory Visit: Payer: BLUE CROSS/BLUE SHIELD | Admitting: Podiatry

## 2021-05-22 ENCOUNTER — Other Ambulatory Visit: Payer: Self-pay

## 2021-05-22 DIAGNOSIS — M19079 Primary osteoarthritis, unspecified ankle and foot: Secondary | ICD-10-CM

## 2021-05-22 DIAGNOSIS — M19071 Primary osteoarthritis, right ankle and foot: Secondary | ICD-10-CM | POA: Diagnosis not present

## 2021-05-22 MED ORDER — DEXAMETHASONE SODIUM PHOSPHATE 120 MG/30ML IJ SOLN
4.0000 mg | Freq: Once | INTRAMUSCULAR | Status: AC
  Administered 2021-05-22: 4 mg via INTRA_ARTICULAR

## 2021-05-22 NOTE — Progress Notes (Signed)
?  Subjective:  ?Patient ID: Deborah Knight, female    DOB: [birth year],  MRN: [id number] ? ?Chief Complaint  ?Patient presents with  ? Foot Pain  ?  arthritis/  ? ?DOS: 08/09/20 ?Procedure: Midfoot exostectomy right  ? ?56 y.o. female presents with the above complaint. History confirmed with patient.  Feels like the injection helped a lot but pain recurred when it wore off. Has been trying to massage the area as directed. Relates she has been going through a lot and the pain is worsening.  ? ?Objective:  ?Physical Exam: tenderness at the surgical site, local edema noted, and calf supple, nontender. Tinel's sign over incision with paresthesias of 2nd/3rd toes. Large soft tissue prominence likely representing scar tissue. ?Incision: Well healed with mildly hypertrophic scar. ? ?Assessment:  ? ?1. Arthritis of midfoot   ? ?Plan:  ?Patient was evaluated and treated and all questions answered. ? ?Midfoot arthritis ?-Repeat injection to midfoot per patient request. Injection given deep to the joint and additionally along incision to reduce scar tissue. ?-MRI ordered  ?-Discussed staying in CAM boot for pain relief.  ?-Discussed possible fusion surgery to aid with pain relief.  ? ?Procedure: Joint Injection ?Location: Right midfoot joint ?Skin Prep: Alcohol. ?Injectate: 1 cc 1% lidocaine plain, 1 cc betamethasone acetate-betamethasone sodium phosphate ?Disposition: Patient tolerated procedure well. Injection site dressed with a band-aid. ? ?No follow-ups on file.  ? ? ?

## 2021-05-28 ENCOUNTER — Other Ambulatory Visit: Payer: Self-pay | Admitting: Family Medicine

## 2021-05-28 DIAGNOSIS — I10 Essential (primary) hypertension: Secondary | ICD-10-CM

## 2021-05-29 ENCOUNTER — Encounter: Payer: Self-pay | Admitting: Family Medicine

## 2021-05-29 ENCOUNTER — Other Ambulatory Visit: Payer: Self-pay | Admitting: Family Medicine

## 2021-05-29 ENCOUNTER — Ambulatory Visit (INDEPENDENT_AMBULATORY_CARE_PROVIDER_SITE_OTHER): Payer: BLUE CROSS/BLUE SHIELD | Admitting: Family Medicine

## 2021-05-29 ENCOUNTER — Telehealth: Payer: Self-pay | Admitting: Podiatry

## 2021-05-29 ENCOUNTER — Other Ambulatory Visit: Payer: Self-pay

## 2021-05-29 VITALS — BP 129/84 | HR 80 | Temp 98.3°F | Ht 62.0 in | Wt 215.2 lb

## 2021-05-29 DIAGNOSIS — G8929 Other chronic pain: Secondary | ICD-10-CM

## 2021-05-29 DIAGNOSIS — Z01419 Encounter for gynecological examination (general) (routine) without abnormal findings: Secondary | ICD-10-CM

## 2021-05-29 DIAGNOSIS — F4310 Post-traumatic stress disorder, unspecified: Secondary | ICD-10-CM

## 2021-05-29 DIAGNOSIS — Z0000 Encounter for general adult medical examination without abnormal findings: Secondary | ICD-10-CM

## 2021-05-29 DIAGNOSIS — F419 Anxiety disorder, unspecified: Secondary | ICD-10-CM

## 2021-05-29 DIAGNOSIS — F411 Generalized anxiety disorder: Secondary | ICD-10-CM

## 2021-05-29 DIAGNOSIS — M25512 Pain in left shoulder: Secondary | ICD-10-CM

## 2021-05-29 DIAGNOSIS — F32A Depression, unspecified: Secondary | ICD-10-CM

## 2021-05-29 DIAGNOSIS — I10 Essential (primary) hypertension: Secondary | ICD-10-CM

## 2021-05-29 MED ORDER — BUSPIRONE HCL 15 MG PO TABS: 15.0000 mg | ORAL_TABLET | Freq: Two times a day (BID) | ORAL | 1 refills | Status: DC

## 2021-05-29 NOTE — Telephone Encounter (Signed)
Dr. Ralene Cork I was wondering about patients work status, is she able to return to work or are you extending leave.  ?

## 2021-05-30 LAB — COMPREHENSIVE METABOLIC PANEL
ALT: 20 IU/L (ref 0–32)
AST: 19 IU/L (ref 0–40)
Albumin/Globulin Ratio: 1.7 (ref 1.2–2.2)
Albumin: 4.2 g/dL (ref 3.8–4.9)
Alkaline Phosphatase: 80 IU/L (ref 44–121)
BUN/Creatinine Ratio: 16 (ref 9–23)
BUN: 9 mg/dL (ref 6–24)
Bilirubin Total: 0.3 mg/dL (ref 0.0–1.2)
CO2: 23 mmol/L (ref 20–29)
Calcium: 9.4 mg/dL (ref 8.7–10.2)
Chloride: 102 mmol/L (ref 96–106)
Creatinine, Ser: 0.58 mg/dL (ref 0.57–1.00)
Globulin, Total: 2.5 g/dL (ref 1.5–4.5)
Glucose: 86 mg/dL (ref 70–99)
Potassium: 3.1 mmol/L — ABNORMAL LOW (ref 3.5–5.2)
Sodium: 142 mmol/L (ref 134–144)
Total Protein: 6.7 g/dL (ref 6.0–8.5)
eGFR: 106 mL/min/{1.73_m2} (ref >59)

## 2021-05-30 LAB — POCT URINALYSIS DIP (CLINITEK)
Bilirubin, UA: NEGATIVE
Blood, UA: NEGATIVE
Glucose, UA: NEGATIVE mg/dL
Ketones, POC UA: NEGATIVE mg/dL
Leukocytes, UA: NEGATIVE
Nitrite, UA: NEGATIVE
POC PROTEIN,UA: NEGATIVE
Spec Grav, UA: 1.015 (ref 1.010–1.025)
Urobilinogen, UA: 0.2 E.U./dL
pH, UA: 6 (ref 5.0–8.0)

## 2021-05-30 LAB — LIPID PANEL
Chol/HDL Ratio: 3.7 ratio (ref 0.0–4.4)
Cholesterol, Total: 227 mg/dL — ABNORMAL HIGH (ref 100–199)
HDL: 61 mg/dL (ref >39)
LDL Chol Calc (NIH): 150 mg/dL — ABNORMAL HIGH (ref 0–99)
Triglycerides: 91 mg/dL (ref 0–149)
VLDL Cholesterol Cal: 16 mg/dL (ref 5–40)

## 2021-05-30 LAB — SPECIMEN STATUS

## 2021-06-01 ENCOUNTER — Other Ambulatory Visit: Payer: Self-pay | Admitting: Family Medicine

## 2021-06-01 DIAGNOSIS — M79676 Pain in unspecified toe(s): Secondary | ICD-10-CM

## 2021-06-01 DIAGNOSIS — F411 Generalized anxiety disorder: Secondary | ICD-10-CM

## 2021-06-03 LAB — IGP,CTNG,APTIMAHPV
Chlamydia, Nuc. Acid Amp: NEGATIVE
Gonococcus by Nucleic Acid Amp: NEGATIVE
HPV Aptima: NEGATIVE

## 2021-06-06 ENCOUNTER — Other Ambulatory Visit: Payer: Self-pay | Admitting: Family Medicine

## 2021-06-06 ENCOUNTER — Telehealth: Payer: Self-pay | Admitting: Family Medicine

## 2021-06-06 DIAGNOSIS — A599 Trichomoniasis, unspecified: Secondary | ICD-10-CM

## 2021-06-06 MED ORDER — METRONIDAZOLE 500 MG PO TABS: 500.0000 mg | ORAL_TABLET | Freq: Two times a day (BID) | ORAL | 0 refills | Status: DC

## 2021-06-06 NOTE — Telephone Encounter (Signed)
Deborah Knight is a 56 year old female that presented for a routine gynecological exam.  Reviewed Pap smear, normal.  However, trichomonas vaginalis was found.  Will treat with metronidazole 500 mg twice daily for 7 days.  Attempted to reach patient by phone, left message.  We will try calling later. ? ?Nolon Nations  APRN, MSN, FNP-C ?[facility] ?[facility] ?[address]  ?[place], [place] [address] ?[phone] ? ?

## 2021-06-06 NOTE — Progress Notes (Signed)
Meds ordered this encounter  Medications  . metroNIDAZOLE (FLAGYL) 500 MG tablet    Sig: Take 1 tablet (500 mg total) by mouth 2 (two) times daily.    Dispense:  14 tablet    Refill:  0    Order Specific Question:   Supervising Provider    Answer:   JEGEDE, OLUGBEMIGA E [[id number]]    [place] Dirosa Moore Jenelle Drennon  APRN, MSN, FNP-C [facility] [address]  [place], [place] [address] [phone]  

## 2021-06-19 ENCOUNTER — Ambulatory Visit: Payer: BLUE CROSS/BLUE SHIELD | Admitting: Podiatry

## 2021-06-24 ENCOUNTER — Other Ambulatory Visit: Payer: Self-pay | Admitting: Family Medicine

## 2021-06-24 DIAGNOSIS — I10 Essential (primary) hypertension: Secondary | ICD-10-CM

## 2021-06-27 ENCOUNTER — Other Ambulatory Visit: Payer: Self-pay | Admitting: Family Medicine

## 2021-06-27 DIAGNOSIS — I10 Essential (primary) hypertension: Secondary | ICD-10-CM

## 2021-07-03 ENCOUNTER — Ambulatory Visit: Payer: BLUE CROSS/BLUE SHIELD | Admitting: Podiatry

## 2021-07-05 NOTE — Progress Notes (Signed)
? ?Established Patient Office Visit ? ?Subjective   ?Patient ID: Deborah Knight, female    DOB: 05/16/1965  Age: 56 y.o. MRN: [id number] ? ?Chief Complaint  ?Patient presents with  ? PHYSICAL  ?  Pt is here for physical/pap smear Pt stated left shoulder unable to left hand,pain rt hand unable to hold things,both hands stay red pt stated she is concern lower back pain rt foot pain, swelling. Pt stated she need refill on all medications.  ? ? ?Deborah Knight is a 56 year old female with a medical history significant for hypertension, obesity generalized anxiety, depression, PTSD, and mild intermittent asthma presents for a routine physical and gynecological exam.  Patient states it has been greater than 5 years since last Pap smear.  Her last menstrual period was 08/13/2012. ?Patient has a history of hypertension.  She has been taking antihypertensives consistently.  She does not check her blood pressure at home.  Patient denies any headache, blurred vision, lower extremity swelling, chest pain, or heart palpitations. ?Patient is complaining of low back pain and chronic pain to left shoulder.  She states that pain intensity has been increasing over the past several months and unrelieved by over-the-counter medications.  Patient has not been evaluated by orthopedic specialist for this problem.  She states that she has difficulty reaching overhead and lifting heavy objects.  She has not had any recent accidents or injuries. ? ?Patient is not up-to-date with eye exam.  She does not do dental visits twice yearly.  She does not exercise.  She does not wear sunscreen.  She endorses wearing a seatbelt.  She denies any suicidal homicidal intentions.  She does not smoke. ? ? ?Past Medical History:  ?Diagnosis Date  ? Allergic rhinitis, seasonal   ? chronic  ? Anxiety   ? Arthritis   ? right foot, knees, hands, shoulder, back  ? Chronic low back pain   ? Depression   ? GERD (gastroesophageal reflux disease)   ? HLD  (hyperlipidemia)   ? Hypertension   ? followed by pcp   (07-29-2019  pt stated never had a stress test)  ? Mild intermittent asthma   ? followed by pcp      ? Numbness of right hand   ? w/ pain and stiffness  ? Peripheral neuropathy   ? Pre-diabetes   ? followed by pcp  ? Sciatica, right side   ? Sleep apnea   ? Wears glasses   ?  ?Social History  ? ?Socioeconomic History  ? Marital status: Divorced  ?  Spouse name: Not on file  ? Number of children: Not on file  ? Years of education: Not on file  ? Highest education level: Some college, no degree  ?Occupational History  ? Not on file  ?Tobacco Use  ? Smoking status: Former  ?  Packs/day: 0.25  ?  Years: 1.00  ?  Pack years: 0.25  ?  Types: Cigarettes  ? Smokeless tobacco: Never  ?Vaping Use  ? Vaping Use: Never used  ?Substance and Sexual Activity  ? Alcohol use: No  ? Drug use: Not Currently  ?  Types: Marijuana  ? Sexual activity: Not on file  ?Other Topics Concern  ? Not on file  ?Social History Narrative  ? Employed as a Psychologist, prison and probation services.  ? ?Social Determinants of Health  ? ?Financial Resource Strain: Not on file  ?Food Insecurity: Not on file  ?Transportation Needs: Not on file  ?Physical Activity: Not on file  ?  Stress: Not on file  ?Social Connections: Not on file  ?Intimate Partner Violence: Not on file  ? ?Immunization History  ?Administered Date(s) Administered  ? DTP 08/03/2004  ? Influenza Split 04/09/2012  ? Influenza Whole 02/09/2004, 02/03/2007, 02/22/2008, 06/12/2009, 01/23/2010  ? Influenza,inj,Quad PF,6+ Mos 12/14/2012, 11/17/2013, 01/31/2015, 11/22/2015, 11/18/2018, 03/14/2020  ? Pneumococcal Conjugate-13 09/17/2017  ? Pneumococcal Polysaccharide-23 01/11/2004, 06/12/2009  ? Td 06/29/2009  ? ?Past Surgical History:  ?Procedure Laterality Date  ? ABDOMINAL HERNIA REPAIR  1989  ? CESAREAN SECTION W/BTL  1997  ? EXOSTECTECTOMY TOE Right 08/04/2019  ? Procedure: [id number] Right MIDFOOT;  Surgeon: Evelina Bucy, DPM;  Location: [facility];  Service: Podiatry;  Laterality: Right;  ? SHOULDER ARTHROSCOPY WITH BICEPS TENDON REPAIR Left 09/25/2012  ? and Labrum repair  ?  ? ?  05/29/2021  ? 11:44 AM 03/30/2019  ? 10:52 AM 02/23/2019  ?  8:45 AM 11/18/2018  ?  8:47 AM 10/14/2018  ?  8:55 AM  ?Depression screen PHQ 2/9  ?Decreased Interest  0 0 0 0  ?Down, Depressed, Hopeless 3 0 $R'1 1 1  'xK$ ?PHQ - 2 Score 3 0 $R'1 1 1  'mH$ ?Altered sleeping 3      ?Tired, decreased energy 0      ?Change in appetite 0      ?Feeling bad or failure about yourself  0      ?Trouble concentrating 0      ?Moving slowly or fidgety/restless 0      ?Suicidal thoughts 0      ?PHQ-9 Score 6      ?  ? ?Review of Systems  ?Constitutional:  Negative for chills and fever.  ?HENT: Negative.    ?Eyes: Negative.   ?Respiratory: Negative.    ?Cardiovascular: Negative.   ?Gastrointestinal: Negative.   ?Genitourinary: Negative.   ?Musculoskeletal:  Positive for back pain.  ?Skin: Negative.   ?Neurological: Negative.   ?Endo/Heme/Allergies: Negative.   ?Psychiatric/Behavioral:  Positive for depression. The patient is nervous/anxious.   ? ?  ?Objective:  ?  ? ?BP 129/84 (BP Location: Right Arm, Patient Position: Sitting)   Pulse 80   Temp 98.3 ?F (36.8 ?C)   Ht $R'5\' 2"'xo$  (1.575 m)   Wt 215 lb 4 oz (97.6 kg)   LMP 08/13/2012   SpO2 100%   BMI 39.37 kg/m?  ?BP Readings from Last 3 Encounters:  ?05/29/21 129/84  ?05/05/20 (!) 142/90  ?08/04/19 (!) 135/91  ? ?Wt Readings from Last 3 Encounters:  ?05/29/21 215 lb 4 oz (97.6 kg)  ?08/04/19 237 lb 11.2 oz (107.8 kg)  ?07/20/19 236 lb (107 kg)  ? ?  ? ?Physical Exam ?Constitutional:   ?   Appearance: Normal appearance. She is obese.  ?HENT:  ?   Mouth/Throat:  ?   Mouth: Mucous membranes are moist.  ?Eyes:  ?   Pupils: Pupils are equal, round, and reactive to light.  ?Cardiovascular:  ?   Rate and Rhythm: Normal rate and regular rhythm.  ?   Pulses: Normal pulses.  ?Pulmonary:  ?   Effort: Pulmonary effort is normal.  ?   Breath sounds: Normal breath  sounds.  ?Abdominal:  ?   General: Bowel sounds are normal.  ?   Hernia: There is no hernia in the left inguinal area or right inguinal area.  ?Genitourinary: ?   Exam position: Prone.  ?   Vagina: No vaginal discharge, tenderness or bleeding.  ?   Cervix: No  cervical motion tenderness.  ?   Uterus: Not deviated.   ?   Adnexa: Right adnexa normal and left adnexa normal.  ?Musculoskeletal:     ?   General: Normal range of motion.  ?Skin: ?   General: Skin is warm.  ?Neurological:  ?   General: No focal deficit present.  ?   Mental Status: She is alert. Mental status is at baseline.  ?Psychiatric:     ?   Mood and Affect: Mood normal.     ?   Behavior: Behavior normal.     ?   Thought Content: Thought content normal.     ?   Judgment: Judgment normal.  ? ? ? ?Results for orders placed or performed in visit on 05/29/21  ?IGP,CtNg,AptimaHPV  ?Result Value Ref Range  ? Interpretation NILM   ? Category NIL   ? Infection TRIC   ? Adequacy SECNI   ? Clinician Provided ICD10 Comment   ? Performed by: Comment   ? Note: Comment   ? Test Methodology Comment   ? HPV Aptima Negative Negative  ? Chlamydia, Nuc. Acid Amp Negative Negative  ? Gonococcus by Nucleic Acid Amp Negative Negative  ?Results for orders placed or performed in visit on 05/29/21  ?Comprehensive metabolic panel  ?Result Value Ref Range  ? Glucose 86 70 - 99 mg/dL  ? BUN 9 6 - 24 mg/dL  ? Creatinine, Ser 0.58 0.57 - 1.00 mg/dL  ? eGFR 106 >59 mL/min/1.73  ? BUN/Creatinine Ratio 16 9 - 23  ? Sodium 142 134 - 144 mmol/L  ? Potassium 3.1 (L) 3.5 - 5.2 mmol/L  ? Chloride 102 96 - 106 mmol/L  ? CO2 23 20 - 29 mmol/L  ? Calcium 9.4 8.7 - 10.2 mg/dL  ? Total Protein 6.7 6.0 - 8.5 g/dL  ? Albumin 4.2 3.8 - 4.9 g/dL  ? Globulin, Total 2.5 1.5 - 4.5 g/dL  ? Albumin/Globulin Ratio 1.7 1.2 - 2.2  ? Bilirubin Total 0.3 0.0 - 1.2 mg/dL  ? Alkaline Phosphatase 80 44 - 121 IU/L  ? AST 19 0 - 40 IU/L  ? ALT 20 0 - 32 IU/L  ?Lipid Panel  ?Result Value Ref Range  ? Cholesterol,  Total 227 (H) 100 - 199 mg/dL  ? Triglycerides 91 0 - 149 mg/dL  ? HDL 61 >39 mg/dL  ? VLDL Cholesterol Cal 16 5 - 40 mg/dL  ? LDL Chol Calc (NIH) 150 (H) 0 - 99 mg/dL  ? Chol/HDL Ratio 3.7 0.0 - 4.4 ratio  ?Specimen Statu

## 2021-07-29 ENCOUNTER — Other Ambulatory Visit: Payer: Self-pay | Admitting: Family Medicine

## 2021-07-29 DIAGNOSIS — I10 Essential (primary) hypertension: Secondary | ICD-10-CM

## 2021-07-31 ENCOUNTER — Encounter: Payer: Self-pay | Admitting: Podiatry

## 2021-07-31 ENCOUNTER — Ambulatory Visit (INDEPENDENT_AMBULATORY_CARE_PROVIDER_SITE_OTHER): Payer: BLUE CROSS/BLUE SHIELD | Admitting: Podiatry

## 2021-07-31 DIAGNOSIS — M19071 Primary osteoarthritis, right ankle and foot: Secondary | ICD-10-CM | POA: Diagnosis not present

## 2021-07-31 DIAGNOSIS — M19079 Primary osteoarthritis, unspecified ankle and foot: Secondary | ICD-10-CM

## 2021-07-31 MED ORDER — DEXAMETHASONE SODIUM PHOSPHATE 120 MG/30ML IJ SOLN
4.0000 mg | Freq: Once | INTRAMUSCULAR | Status: AC
  Administered 2021-07-31: 4 mg via INTRA_ARTICULAR

## 2021-07-31 MED ORDER — GABAPENTIN 300 MG PO CAPS: 300.0000 mg | ORAL_CAPSULE | Freq: Every day | ORAL | 3 refills | Status: DC

## 2021-07-31 NOTE — Progress Notes (Signed)
  Subjective:  Patient ID: Deborah Knight, female    DOB: 06/22/1965,  MRN: [id number]  Chief Complaint  Patient presents with   Arthritis     Right foot arthritis , patient states foot is the same since the last visit   DOS: 08/09/20 Procedure: Midfoot exostectomy right   56 y.o. female presents with the above complaint. History confirmed with patient.  Feels like the injection helped a lot but pain recurred when it wore off. Has had trouble with insurance and was unable to get her MRI yet, but she is working on it.   Objective:  Physical Exam: tenderness at the surgical site, local edema noted, and calf supple, nontender. Tinel's sign over incision with paresthesias of 2nd/3rd toes. Large soft tissue prominence likely representing scar tissue. Incision: Well healed with mildly hypertrophic scar.  Assessment:   1. Arthritis of midfoot     Plan:  Patient was evaluated and treated and all questions answered.  Midfoot arthritis -Repeat injection to midfoot per patient request. Injection given deep to the joint and additionally along incision to reduce scar tissue. -MRI ordered . Still awaiting insurance approval.  -Discussed staying in CAM boot for pain relief.  -Will remain out of work until MRI and possible surgery.  -Gabapentin sent in for nerve related pain.  -Discussed possible fusion surgery to aid with pain relief.   Procedure: Joint Injection Location: Right midfoot joint Skin Prep: Alcohol. Injectate: 1 cc 1% lidocaine plain, 1 cc betamethasone acetate-betamethasone sodium phosphate Disposition: Patient tolerated procedure well. Injection site dressed with a band-aid.  No follow-ups on file.

## 2021-08-26 ENCOUNTER — Other Ambulatory Visit: Payer: Self-pay | Admitting: Family Medicine

## 2021-08-26 DIAGNOSIS — I10 Essential (primary) hypertension: Secondary | ICD-10-CM

## 2021-09-14 ENCOUNTER — Other Ambulatory Visit: Payer: Self-pay | Admitting: Family Medicine

## 2021-09-14 DIAGNOSIS — F411 Generalized anxiety disorder: Secondary | ICD-10-CM

## 2021-09-14 NOTE — Telephone Encounter (Signed)
Requesting: alprazolam Contract: n/a UDS: n/a Last Visit: 05/29/21 Next Visit: 11/20/21 Last Refill: 06/01/21(30,0)  Please Advise. Medication pending

## 2021-09-18 ENCOUNTER — Encounter: Payer: Self-pay | Admitting: Podiatry

## 2021-09-18 ENCOUNTER — Ambulatory Visit (INDEPENDENT_AMBULATORY_CARE_PROVIDER_SITE_OTHER): Payer: Self-pay | Admitting: Podiatry

## 2021-09-18 DIAGNOSIS — M19079 Primary osteoarthritis, unspecified ankle and foot: Secondary | ICD-10-CM

## 2021-09-18 MED ORDER — DEXAMETHASONE SODIUM PHOSPHATE 120 MG/30ML IJ SOLN
4.0000 mg | Freq: Once | INTRAMUSCULAR | Status: AC
  Administered 2021-09-18: 4 mg via INTRA_ARTICULAR

## 2021-09-18 NOTE — Progress Notes (Signed)
  Subjective:  Patient ID: Deborah Knight, female    DOB: 1965/07/11,  MRN: [id number]  No chief complaint on file.  DOS: 08/09/20 Procedure: Midfoot exostectomy right   56 y.o. female presents with the above complaint. History confirmed with patient.  Feels like the injection helped a lot but pain comes back after a while with injections.  Has had trouble with insurance and was unable to get her MRI yet and is not in a place to discuss surgery. Unable to get accomadations at work for shoe.   Objective:  Physical Exam: tenderness at the surgical site, local edema noted, and calf supple, nontender. Tinel's sign over incision with paresthesias of 2nd/3rd toes. Large soft tissue prominence likely representing scar tissue. Incision: Well healed with mildly hypertrophic scar.  Assessment:   1. Arthritis of midfoot      Plan:  Patient was evaluated and treated and all questions answered.  Midfoot arthritis -Repeat injection to midfoot per patient request. Injection given deep to the joint and additionally along incision to reduce scar tissue. -MRI still awaiting and will get when she has the means.  -Discussed staying in surgical shoe for pain relief.  -Will remain out of work until MRI and possible surgery and will continue with injections regularly to help with pain in meantime.  -Continue gabapentin.   -Discussed possible fusion surgery to aid with pain relief.   Procedure: Joint Injection Location: Right midfoot joint Skin Prep: Alcohol. Injectate: 1 cc 1% lidocaine plain, 1 cc dexamethasone acetate-betamethasone sodium phosphate Disposition: Patient tolerated procedure well. Injection site dressed with a band-aid.  No follow-ups on file.

## 2021-09-27 ENCOUNTER — Other Ambulatory Visit: Payer: Self-pay | Admitting: Family Medicine

## 2021-09-27 DIAGNOSIS — I10 Essential (primary) hypertension: Secondary | ICD-10-CM

## 2021-10-11 ENCOUNTER — Other Ambulatory Visit: Payer: Self-pay | Admitting: Family Medicine

## 2021-10-11 DIAGNOSIS — I10 Essential (primary) hypertension: Secondary | ICD-10-CM

## 2021-10-26 ENCOUNTER — Other Ambulatory Visit: Payer: Self-pay | Admitting: Family Medicine

## 2021-10-26 DIAGNOSIS — F419 Anxiety disorder, unspecified: Secondary | ICD-10-CM

## 2021-10-26 DIAGNOSIS — F411 Generalized anxiety disorder: Secondary | ICD-10-CM

## 2021-11-07 ENCOUNTER — Other Ambulatory Visit: Payer: Self-pay | Admitting: Family Medicine

## 2021-11-07 DIAGNOSIS — I10 Essential (primary) hypertension: Secondary | ICD-10-CM

## 2021-11-13 ENCOUNTER — Ambulatory Visit (INDEPENDENT_AMBULATORY_CARE_PROVIDER_SITE_OTHER): Payer: Self-pay | Admitting: Podiatry

## 2021-11-13 ENCOUNTER — Encounter: Payer: Self-pay | Admitting: Podiatry

## 2021-11-13 DIAGNOSIS — M19079 Primary osteoarthritis, unspecified ankle and foot: Secondary | ICD-10-CM

## 2021-11-13 MED ORDER — DEXAMETHASONE SODIUM PHOSPHATE 120 MG/30ML IJ SOLN
4.0000 mg | Freq: Once | INTRAMUSCULAR | Status: AC
  Administered 2021-11-13: 4 mg via INTRA_ARTICULAR

## 2021-11-13 NOTE — Progress Notes (Signed)
  Subjective:  Patient ID: Deborah Knight, female    DOB: 1965/03/19,  MRN: [id number]  Chief Complaint  Patient presents with   Arthritis    Patient states that she would like injections. 7/10 pain level.    DOS: 08/09/20 Procedure: Midfoot exostectomy right   56 y.o. female presents with the above complaint. History confirmed with patient.  Feels like the injection helped a lot but pain comes back after a while with injections.  Has had trouble with insurance and was unable to get her MRI yet and is not in a place to discuss surgery. Unable to get accomadations at work for shoe.   Objective:  Physical Exam: tenderness at the surgical site, local edema noted, and calf supple, nontender. Tinel's sign over incision with paresthesias of 2nd/3rd toes. Large soft tissue prominence likely representing scar tissue. Incision: Well healed with mildly hypertrophic scar.  Assessment:   1. Arthritis of midfoot       Plan:  Patient was evaluated and treated and all questions answered.  Midfoot arthritis -Repeat injection to midfoot per patient request. Injection given deep to the joint and additionally along incision to reduce scar tissue. -MRI still awaiting and will proceed when she has the means.  -Discussed staying in surgical shoe for pain relief.  -Will remain out of work until MRI and possible surgery and will continue with injections regularly to help with pain in meantime.  -Continue gabapentin.   -Discussed possible fusion surgery to aid with pain relief.   Procedure: Joint Injection Location: Right midfoot joint Skin Prep: Alcohol. Injectate: 1 cc 1% lidocaine plain, 1 cc dexamethasone acetate-betamethasone sodium phosphate Disposition: Patient tolerated procedure well. Injection site dressed with a band-aid.  No follow-ups on file.

## 2021-11-18 ENCOUNTER — Other Ambulatory Visit: Payer: Self-pay | Admitting: Family Medicine

## 2021-11-18 DIAGNOSIS — F411 Generalized anxiety disorder: Secondary | ICD-10-CM

## 2021-11-20 ENCOUNTER — Ambulatory Visit: Payer: Self-pay | Admitting: Family Medicine

## 2021-11-30 ENCOUNTER — Ambulatory Visit
Admission: EM | Admit: 2021-11-30 | Discharge: 2021-11-30 | Disposition: A | Discharge status: Home / Self Care | Payer: Self-pay | Attending: Internal Medicine | Admitting: Internal Medicine

## 2021-11-30 ENCOUNTER — Encounter: Payer: Self-pay | Admitting: Emergency Medicine

## 2021-11-30 DIAGNOSIS — M545 Low back pain, unspecified: Secondary | ICD-10-CM

## 2021-11-30 MED ORDER — DEXAMETHASONE SODIUM PHOSPHATE 10 MG/ML IJ SOLN
10.0000 mg | Freq: Once | INTRAMUSCULAR | Status: AC
  Administered 2021-11-30: 10 mg via INTRAMUSCULAR
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
## 2021-11-30 NOTE — Discharge Instructions (Signed)
You were given a steroid injection today in urgent care.  Please follow-up if symptoms persist or worsen.  Please avoid any ibuprofen, Advil, Aleve for at least 24 hours following injection.

## 2021-11-30 NOTE — ED Triage Notes (Signed)
Pt is present today with concerns for a back spasm on the lower left side. Pt states she noticed the pain Tuesday

## 2021-12-25 ENCOUNTER — Other Ambulatory Visit: Payer: Self-pay | Admitting: Family Medicine

## 2021-12-25 DIAGNOSIS — I10 Essential (primary) hypertension: Secondary | ICD-10-CM

## 2022-01-06 ENCOUNTER — Other Ambulatory Visit: Payer: Self-pay | Admitting: Family Medicine

## 2022-01-06 DIAGNOSIS — I10 Essential (primary) hypertension: Secondary | ICD-10-CM

## 2022-01-07 NOTE — Telephone Encounter (Signed)
Lvm for pt to call back and advise if she has enough of HCTZ to last until her appt 01/15/22. [place]

## 2022-01-15 ENCOUNTER — Ambulatory Visit: Payer: Self-pay | Admitting: Family Medicine

## 2022-01-15 ENCOUNTER — Ambulatory Visit (INDEPENDENT_AMBULATORY_CARE_PROVIDER_SITE_OTHER): Payer: Self-pay | Admitting: Podiatry

## 2022-01-15 DIAGNOSIS — M19079 Primary osteoarthritis, unspecified ankle and foot: Secondary | ICD-10-CM

## 2022-01-16 NOTE — Progress Notes (Signed)
  Subjective:  Patient ID: Deborah Knight, female    DOB: 1966/01/31,  MRN: [id number]  Chief Complaint  Patient presents with   Arthritis    2 month follow up arthritis of midfoot -injection    56 y.o. female presents with the above complaint. History confirmed with patient.  She has had multiple surgeries on the right foot including shaving down on bone spurs in the midfoot.  She has had chronic pain and issues of this since then.  She is considering further surgery with Dr. Ralene Cork, but so far is unable to because she still not working  Objective:  Physical Exam: warm, good capillary refill, no trophic changes or ulcerative lesions, normal DP and PT pulses, normal sensory exam, and pain on palpation to dorsal midfoot where there is palpable spurring and a well-healed scar, she says it radiates into the toes.  Assessment:   1. Arthritis of midfoot      Plan:  Patient was evaluated and treated and all questions answered.  We discussed further treatment.  She is responded well to injections intermittently so far.  She would like 1 of these today she says.  Following sterile prep with Betadine the dorsal midfoot and first and second TMT's were injected with 4 mg of dexamethasone and 0.5 cc of Marcaine.  She tolerated this well and was dressed with a Band-Aid.  She will follow-up with Dr. Ralene Cork.  Return in about 10 weeks (around 03/26/2022) for follow up on right foot arthritis .

## 2022-01-17 ENCOUNTER — Telehealth: Payer: Self-pay | Admitting: Podiatry

## 2022-01-21 NOTE — Telephone Encounter (Signed)
error 

## 2022-01-29 ENCOUNTER — Other Ambulatory Visit: Payer: Self-pay | Admitting: Family Medicine

## 2022-01-29 ENCOUNTER — Telehealth: Payer: Self-pay | Admitting: Family Medicine

## 2022-01-29 DIAGNOSIS — I10 Essential (primary) hypertension: Secondary | ICD-10-CM

## 2022-01-29 MED ORDER — HYDROCHLOROTHIAZIDE 12.5 MG PO TABS: 12.5000 mg | ORAL_TABLET | Freq: Every day | ORAL | 1 refills | Status: DC

## 2022-01-29 MED ORDER — AMLODIPINE BESYLATE 10 MG PO TABS: 10.0000 mg | ORAL_TABLET | Freq: Every day | ORAL | 1 refills | Status: DC

## 2022-01-29 NOTE — Telephone Encounter (Addendum)
hydrochlorothiazide and amlodipine  refill request

## 2022-01-29 NOTE — Progress Notes (Signed)
Reviewed PDMP substance reporting system prior to prescribing opiate medications. No inconsistencies noted.  Meds ordered this encounter  Medications   amLODipine (NORVASC) 10 MG tablet    Sig: Take 1 tablet (10 mg total) by mouth daily.    Dispense:  90 tablet    Refill:  1    Order Specific Question:   Supervising Provider    Answer:   Quentin Angst [[id number]]   hydrochlorothiazide (HYDRODIURIL) 12.5 MG tablet    Sig: Take 1 tablet (12.5 mg total) by mouth daily.    Dispense:  90 tablet    Refill:  1    Order Specific Question:   Supervising Provider    Answer:   Quentin Angst [[id number]]   Nolon Nations  APRN, MSN, FNP-C [facility] [address] [place], [place] [address] [phone]

## 2022-02-22 ENCOUNTER — Other Ambulatory Visit: Payer: Self-pay | Admitting: Family Medicine

## 2022-02-22 DIAGNOSIS — F411 Generalized anxiety disorder: Secondary | ICD-10-CM

## 2022-02-22 DIAGNOSIS — F32A Depression, unspecified: Secondary | ICD-10-CM

## 2022-02-22 NOTE — Telephone Encounter (Signed)
Please advise [facility] 

## 2022-03-12 ENCOUNTER — Encounter: Payer: Self-pay | Admitting: Family Medicine

## 2022-03-12 ENCOUNTER — Ambulatory Visit (INDEPENDENT_AMBULATORY_CARE_PROVIDER_SITE_OTHER): Payer: Self-pay | Admitting: Family Medicine

## 2022-03-12 VITALS — BP 106/82 | HR 88 | Temp 97.4°F | Ht 62.0 in | Wt 199.8 lb

## 2022-03-12 DIAGNOSIS — M25511 Pain in right shoulder: Secondary | ICD-10-CM

## 2022-03-12 DIAGNOSIS — F4310 Post-traumatic stress disorder, unspecified: Secondary | ICD-10-CM

## 2022-03-12 DIAGNOSIS — F411 Generalized anxiety disorder: Secondary | ICD-10-CM

## 2022-03-12 DIAGNOSIS — Z1231 Encounter for screening mammogram for malignant neoplasm of breast: Secondary | ICD-10-CM

## 2022-03-12 DIAGNOSIS — R5383 Other fatigue: Secondary | ICD-10-CM

## 2022-03-12 DIAGNOSIS — G8929 Other chronic pain: Secondary | ICD-10-CM

## 2022-03-12 DIAGNOSIS — F419 Anxiety disorder, unspecified: Secondary | ICD-10-CM

## 2022-03-12 DIAGNOSIS — F32A Depression, unspecified: Secondary | ICD-10-CM

## 2022-03-12 DIAGNOSIS — F5105 Insomnia due to other mental disorder: Secondary | ICD-10-CM

## 2022-03-12 DIAGNOSIS — I10 Essential (primary) hypertension: Secondary | ICD-10-CM

## 2022-03-12 DIAGNOSIS — F99 Mental disorder, not otherwise specified: Secondary | ICD-10-CM

## 2022-03-12 DIAGNOSIS — Z1159 Encounter for screening for other viral diseases: Secondary | ICD-10-CM

## 2022-03-12 DIAGNOSIS — Z23 Encounter for immunization: Secondary | ICD-10-CM

## 2022-03-12 DIAGNOSIS — M25512 Pain in left shoulder: Secondary | ICD-10-CM

## 2022-03-12 MED ORDER — TRAZODONE HCL 50 MG PO TABS: 25.0000 mg | ORAL_TABLET | Freq: Every evening | ORAL | 3 refills | Status: DC | PRN

## 2022-03-12 NOTE — Progress Notes (Signed)
Established Patient Office Visit  Subjective   Patient ID: Deborah Knight, female    DOB: 10/30/1965  Age: 57 y.o. MRN: [id number]  Chief Complaint  Patient presents with  . Sickle Cell Anemia    With lots of hand pain     HPI  Patient Active Problem List   Diagnosis Date Noted  . Screening breast examination 03/09/2019  . Prediabetes 11/17/2013  . Vitamin D deficiency 09/23/2013  . Groin strain 09/14/2013  . Other malaise and fatigue 08/23/2013  . Metabolic syndrome 43/15/4008  . Back pain 08/23/2013  . Angioedema 01/06/2013  . Sprain and strain of shoulder and upper arm 10/09/2012  . Bicipital tenosynovitis 09/04/2012  . Joint derangement, shoulder region 07/10/2012  . Closed dislocation of shoulder 07/10/2012  . Dislocation of left shoulder joint 05/22/2012  . Right facial swelling 04/07/2012  . Rash 04/07/2012  . Tobacco abuse 02/04/2012  . KNEE PAIN, RIGHT 02/07/2010  . GERD 10/10/2009  . Depression 09/13/2009  . ALLERGIC RHINITIS 06/12/2009  . Obesity 08/30/2008  . LOW BACK PAIN SYNDROME 08/30/2008  . HYPERLIPIDEMIA 11/03/2006  . Anxiety, generalized 11/03/2006  . Essential hypertension 11/03/2006  . Asthma 11/03/2006   Past Medical History:  Diagnosis Date  . Allergic rhinitis, seasonal    chronic  . Anxiety   . Arthritis    right foot, knees, hands, shoulder, back  . Chronic low back pain   . Depression   . GERD (gastroesophageal reflux disease)   . HLD (hyperlipidemia)   . Hypertension    followed by pcp   (07-29-2019  pt stated never had a stress test)  . Mild intermittent asthma    followed by pcp      . Numbness of right hand    w/ pain and stiffness  . Peripheral neuropathy   . Pre-diabetes    followed by pcp  . Sciatica, right side   . Sleep apnea   . Wears glasses    Past Surgical History:  Procedure Laterality Date  . ABDOMINAL HERNIA REPAIR  1989  . CESAREAN SECTION W/BTL  1997  . EXOSTECTECTOMY TOE Right 08/04/2019    Procedure: [id number] Right MIDFOOT;  Surgeon: Evelina Bucy, DPM;  Location: [facility];  Service: Podiatry;  Laterality: Right;  . SHOULDER ARTHROSCOPY WITH BICEPS TENDON REPAIR Left 09/25/2012   and Labrum repair   Social History   Tobacco Use  . Smoking status: Former    Packs/day: 0.25    Years: 1.00    Total pack years: 0.25    Types: Cigarettes  . Smokeless tobacco: Never  Vaping Use  . Vaping Use: Never used  Substance Use Topics  . Alcohol use: No  . Drug use: Not Currently    Types: Marijuana   Social History   Socioeconomic History  . Marital status: Divorced    Spouse name: Not on file  . Number of children: Not on file  . Years of education: Not on file  . Highest education level: Some college, no degree  Occupational History  . Not on file  Tobacco Use  . Smoking status: Former    Packs/day: 0.25    Years: 1.00    Total pack years: 0.25    Types: Cigarettes  . Smokeless tobacco: Never  Vaping Use  . Vaping Use: Never used  Substance and Sexual Activity  . Alcohol use: No  . Drug use: Not Currently    Types: Marijuana  . Sexual activity: Not on  file  Other Topics Concern  . Not on file  Social History Narrative   Employed as a Psychologist, prison and probation services.   Social Determinants of Health   Financial Resource Strain: Not on file  Food Insecurity: Not on file  Transportation Needs: No Transportation Needs (03/09/2019)   PRAPARE - Transportation   . Lack of Transportation (Medical): No   . Lack of Transportation (Non-Medical): No  Physical Activity: Not on file  Stress: Not on file  Social Connections: Not on file  Intimate Partner Violence: Not on file   Family Status  Relation Name Status  . Father  (Not Specified)  . Brother  (Not Specified)  . Mother  (Not Specified)   Family History  Problem Relation Age of Onset  . Cancer Father   . Asthma Brother   . Hyperlipidemia Mother    Allergies  Allergen Reactions  .  Losartan Swelling    Lip swelling  . Shellfish-Derived Products     Other reaction(s): Angioedema (ALLERGY/intolerance)  Shrimps  . Toradol [Ketorolac Tromethamine] Swelling    Lip swelling  . Latex Rash      Review of Systems  Constitutional:  Negative for chills and fever.  HENT: Negative.    Eyes: Negative.   Respiratory: Negative.    Cardiovascular: Negative.   Genitourinary: Negative.   Musculoskeletal: Negative.   Skin: Negative.   Neurological: Negative.   Psychiatric/Behavioral:  Positive for depression. Negative for suicidal ideas.       Objective:     BP 106/82   Pulse 88   Temp (!) 97.4 F (36.3 C)   Ht _0  (1.575 m)   Wt 199 lb 12.8 oz (90.6 kg)   LMP 08/13/2012   SpO2 100%   BMI 36.54 kg/m  BP Readings from Last 3 Encounters:  03/12/22 106/82  11/30/21 (!) 169/111  05/29/21 129/84   Wt Readings from Last 3 Encounters:  03/12/22 199 lb 12.8 oz (90.6 kg)  05/29/21 215 lb 4 oz (97.6 kg)  08/04/19 237 lb 11.2 oz (107.8 kg)      Physical Exam Constitutional:      Appearance: Normal appearance.  Cardiovascular:     Rate and Rhythm: Normal rate.  Pulmonary:     Effort: Pulmonary effort is normal.  Abdominal:     General: Bowel sounds are normal.  Musculoskeletal:        General: Normal range of motion.  Skin:    General: Skin is warm.  Neurological:     General: No focal deficit present.     Mental Status: She is alert. Mental status is at baseline.  Psychiatric:        Mood and Affect: Mood normal.        Behavior: Behavior normal.        Thought Content: Thought content normal.        Judgment: Judgment normal.     No results found for any visits on 03/12/22.  Last CBC Lab Results  Component Value Date   WBC WILL FOLLOW 05/29/2021   HGB WILL FOLLOW 05/29/2021   HCT WILL FOLLOW 05/29/2021   MCV WILL FOLLOW 05/29/2021   MCH WILL FOLLOW 05/29/2021   RDW WILL FOLLOW 05/29/2021   PLT WILL FOLLOW 31/54/0086   Last metabolic  panel Lab Results  Component Value Date   GLUCOSE 86 05/29/2021   NA 142 05/29/2021   K 3.1 (L) 05/29/2021   CL 102 05/29/2021   CO2 23 05/29/2021   BUN 9  05/29/2021   CREATININE 0.58 05/29/2021   EGFR 106 05/29/2021   CALCIUM 9.4 05/29/2021   PROT 6.7 05/29/2021   ALBUMIN 4.2 05/29/2021   LABGLOB 2.5 05/29/2021   AGRATIO 1.7 05/29/2021   BILITOT 0.3 05/29/2021   ALKPHOS 80 05/29/2021   AST 19 05/29/2021   ALT 20 05/29/2021   ANIONGAP 9 05/25/2017   Last lipids Lab Results  Component Value Date   CHOL 227 (H) 05/29/2021   HDL 61 05/29/2021   LDLCALC 150 (H) 05/29/2021   LDLDIRECT 222 (H) 04/09/2012   TRIG 91 05/29/2021   CHOLHDL 3.7 05/29/2021   Last hemoglobin A1c Lab Results  Component Value Date   HGBA1C 5.5 07/20/2019   HGBA1C 5.5 07/20/2019   HGBA1C 5.5 (A) 07/20/2019   HGBA1C 5.5 07/20/2019   Last thyroid functions Lab Results  Component Value Date   TSH 0.627 05/30/2017   Last vitamin D Lab Results  Component Value Date   VD25OH 11.9 (L) 10/14/2018   Last vitamin B12 and Folate No results found for: "VITAMINB12", "FOLATE"    The 10-year ASCVD risk score (Arnett DK, et al., 2019) is: 3.2%    Assessment & Plan:   Problem List Items Addressed This Visit   None   No follow-ups on file.   Donia Pounds  APRN, MSN, FNP-C Patient [facility] [address] [place], [place] [address] [phone]

## 2022-03-13 LAB — COMPREHENSIVE METABOLIC PANEL
ALT: 12 IU/L (ref 0–32)
AST: 14 IU/L (ref 0–40)
Albumin/Globulin Ratio: 1.7 (ref 1.2–2.2)
Albumin: 4.5 g/dL (ref 3.8–4.9)
Alkaline Phosphatase: 81 IU/L (ref 44–121)
BUN/Creatinine Ratio: 15 (ref 9–23)
BUN: 10 mg/dL (ref 6–24)
Bilirubin Total: 0.3 mg/dL (ref 0.0–1.2)
CO2: 26 mmol/L (ref 20–29)
Calcium: 9.9 mg/dL (ref 8.7–10.2)
Chloride: 103 mmol/L (ref 96–106)
Creatinine, Ser: 0.67 mg/dL (ref 0.57–1.00)
Globulin, Total: 2.6 g/dL (ref 1.5–4.5)
Glucose: 91 mg/dL (ref 70–99)
Potassium: 3.3 mmol/L — ABNORMAL LOW (ref 3.5–5.2)
Sodium: 142 mmol/L (ref 134–144)
Total Protein: 7.1 g/dL (ref 6.0–8.5)
eGFR: 102 mL/min/{1.73_m2} (ref >59)

## 2022-03-13 LAB — CBC
Hematocrit: 41.8 % (ref 34.0–46.6)
Hemoglobin: 13.8 g/dL (ref 11.1–15.9)
MCH: 28.7 pg (ref 26.6–33.0)
MCHC: 33 g/dL (ref 31.5–35.7)
MCV: 87 fL (ref 79–97)
Platelets: 284 10*3/uL (ref 150–450)
RBC: 4.81 x10E6/uL (ref 3.77–5.28)
RDW: 13.6 % (ref 11.7–15.4)
WBC: 4.8 10*3/uL (ref 3.4–10.8)

## 2022-03-13 LAB — HEPATITIS C ANTIBODY: Hep C Virus Ab: NONREACTIVE

## 2022-03-15 ENCOUNTER — Ambulatory Visit (HOSPITAL_BASED_OUTPATIENT_CLINIC_OR_DEPARTMENT_OTHER): Payer: Self-pay | Admitting: Orthopaedic Surgery

## 2022-03-26 ENCOUNTER — Ambulatory Visit (INDEPENDENT_AMBULATORY_CARE_PROVIDER_SITE_OTHER): Payer: Self-pay | Admitting: Podiatry

## 2022-03-26 ENCOUNTER — Encounter: Payer: Self-pay | Admitting: Podiatry

## 2022-03-26 VITALS — BP 120/65 | HR 78

## 2022-03-26 DIAGNOSIS — M19079 Primary osteoarthritis, unspecified ankle and foot: Secondary | ICD-10-CM

## 2022-03-26 NOTE — Progress Notes (Signed)
  Subjective:  Patient ID: Deborah Knight, female    DOB: Feb 23, 1966,  MRN: [id number]  Chief Complaint  Patient presents with   Arthritis    Patient states that she would like injections. 7/10 pain level.    DOS: 08/09/20 Procedure: Midfoot exostectomy right   57 y.o. female presents with the above complaint. History confirmed with patient. Injections have been helping and here today for another.  Has had trouble with insurance and was unable to get her MRI yet and is still not in a place to discuss surgery. Unable to get accomadations at work for shoe.   Objective:  Physical Exam: tenderness at the surgical site, local edema noted, and calf supple, nontender. Tinel's sign over incision with paresthesias of 2nd/3rd toes. Large soft tissue prominence likely representing scar tissue. Incision: Well healed with mildly hypertrophic scar.  Assessment:   1. Arthritis of midfoot       Plan:  Patient was evaluated and treated and all questions answered.  Midfoot arthritis -Repeat injection to midfoot per patient request. Injection given deep to the joint and additionally along incision to reduce scar tissue. -MRI still awaiting and will proceed when she has the means.  -Discussed staying in surgical shoe for pain relief.  -Will remain out of work until MRI and possible surgery and will continue with injections regularly to help with pain in meantime.  -Continue gabapentin.   -Discussed possible fusion surgery to aid with pain relief.   Procedure: Joint Injection Location: Right midfoot joint Skin Prep: Alcohol. Injectate: 1 cc 1% lidocaine plain, 1 cc dexamethasone acetate-betamethasone sodium phosphate Disposition: Patient tolerated procedure well. Injection site dressed with a band-aid.  No follow-ups on file.

## 2022-05-11 ENCOUNTER — Other Ambulatory Visit: Payer: Self-pay | Admitting: Family Medicine

## 2022-05-11 DIAGNOSIS — F411 Generalized anxiety disorder: Secondary | ICD-10-CM

## 2022-05-13 NOTE — Telephone Encounter (Signed)
Please advise 

## 2022-05-21 ENCOUNTER — Ambulatory Visit (INDEPENDENT_AMBULATORY_CARE_PROVIDER_SITE_OTHER): Payer: Self-pay | Admitting: Podiatry

## 2022-05-21 ENCOUNTER — Encounter: Payer: Self-pay | Admitting: Podiatry

## 2022-05-21 DIAGNOSIS — M19079 Primary osteoarthritis, unspecified ankle and foot: Secondary | ICD-10-CM

## 2022-05-21 MED ORDER — DEXAMETHASONE SODIUM PHOSPHATE 120 MG/30ML IJ SOLN
4.0000 mg | Freq: Once | INTRAMUSCULAR | Status: AC
  Administered 2022-05-21: 4 mg via INTRA_ARTICULAR

## 2022-05-21 NOTE — Progress Notes (Signed)
  Subjective:  Patient ID: Deborah Knight, female    DOB: 1965-08-02,  MRN: [id number]  Chief Complaint  Patient presents with   Arthritis    Patient states arthritis is foot is about the same since the last visit. Constant pain and swelling still. Patient states she still uses Her Cam boot when needed , but is still using her surgical shoe often    DOS: 08/09/20 Procedure: Midfoot exostectomy right   57 y.o. female presents with the above complaint. History confirmed with patient. Injections have been helping and here today for another.  Has had trouble with insurance and was unable to get her MRI yet and is still not in a place to discuss surgery. She is working on getting disability.   Objective:  Physical Exam: tenderness at the surgical site, local edema noted, and calf supple, nontender. Tinel's sign over incision with paresthesias of 2nd/3rd toes. Large soft tissue prominence likely representing scar tissue. Incision: Well healed with mildly hypertrophic scar.  Assessment:   1. Arthritis of midfoot        Plan:  Patient was evaluated and treated and all questions answered.  Midfoot arthritis -Repeat injection to midfoot per patient request. Injection given deep to the joint and additionally along incision to reduce scar tissue. -MRI still awaiting and will proceed when she has the means.  -Discussed staying in surgical shoe for pain relief.  -Will remain out of work until MRI and possible surgery and will continue with injections regularly to help with pain in meantime. Patient is working on getting disability and agree that patient has limited function and will not be able to work a job that requires being on her feet.  -Continue gabapentin.   -Discussed possible fusion surgery to aid with pain relief but will wait until she has the financial means.   Procedure: Joint Injection Location: Right midfoot joint Skin Prep: Alcohol. Injectate: 1 cc 1% lidocaine plain, 1 cc  dexamethasone acetate-betamethasone sodium phosphate Disposition: Patient tolerated procedure well. Injection site dressed with a band-aid.  No follow-ups on file.

## 2022-06-30 IMAGING — MR MR FOOT*R* W/O CM
4 of 5 series · 13 of 40 positions shown · non-contrast
Comparison: Radiographs 09/23/2019

CLINICAL DATA: Chronic hindfoot pain.

EXAM:
MRI OF THE RIGHT FOREFOOT WITHOUT CONTRAST
TECHNIQUE: Multiplanar, multisequence MR imaging of the ankle was performed. No
intravenous contrast was administered.

[Series 3: PD fat-sat · axial · right · 3.0mm · 0.30mm/px · z∈[-66,+69]mm · 4 of 40 slices shown]
[im 1/40]
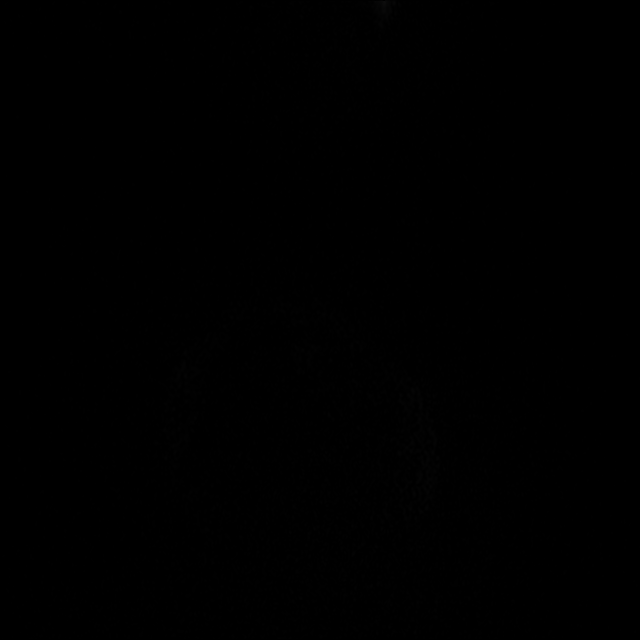
[im 5/40]
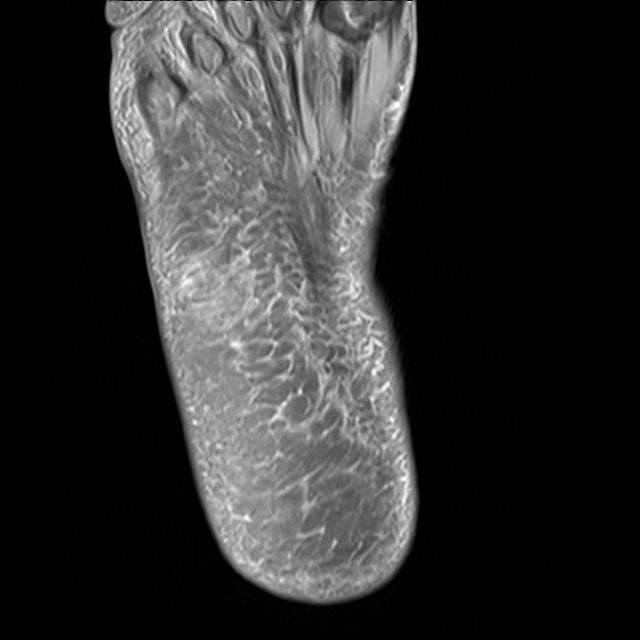
[im 20/40]
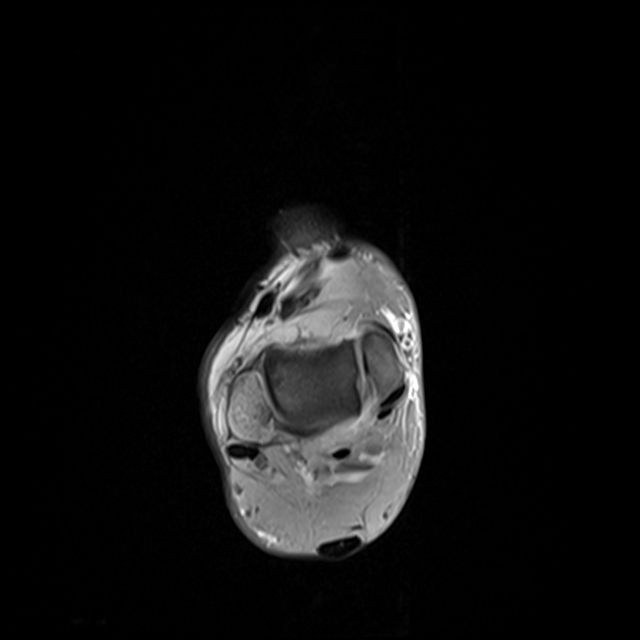
[im 35/40]
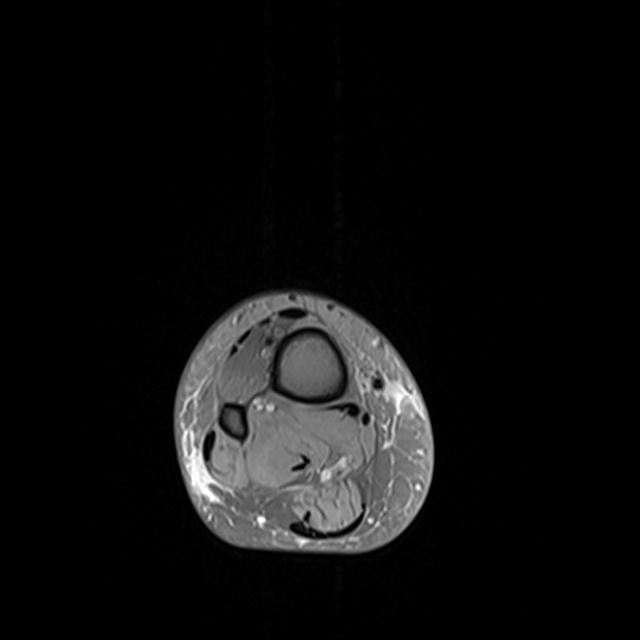

[Series 4: T2 fat-sat · axial · right · 3.0mm · 0.30mm/px · z∈[-50,+69]mm · 3 of 40 slices shown (1 of 2)]
[im 5/40]
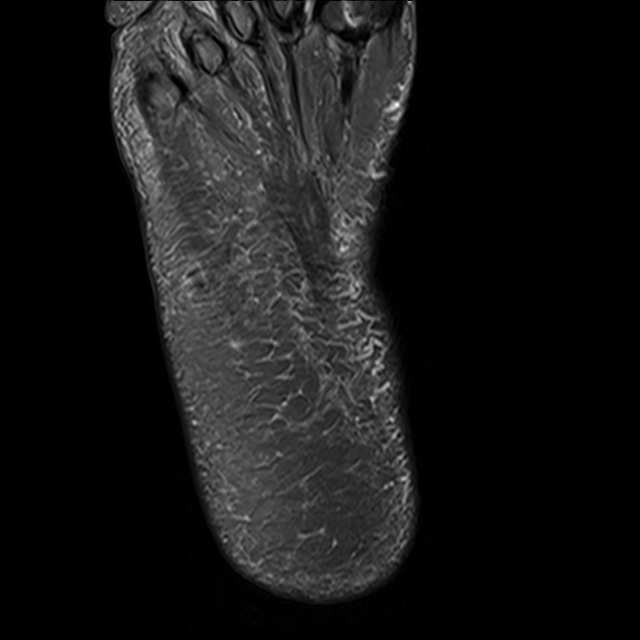
[im 22/40]
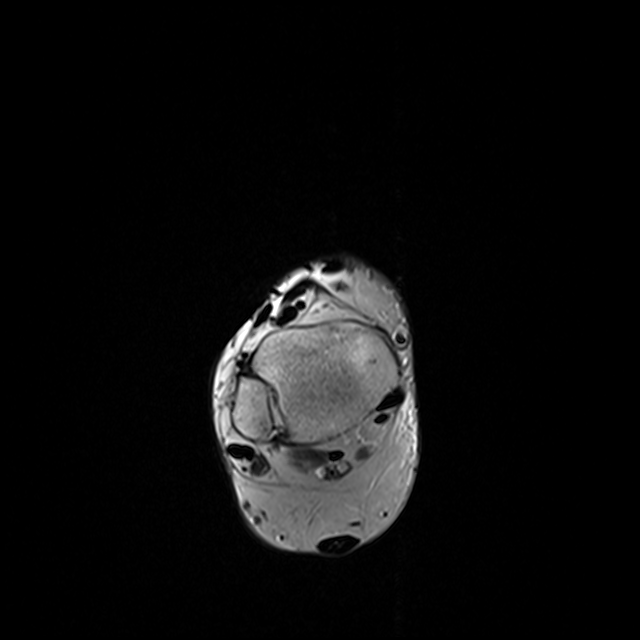
[im 35/40]
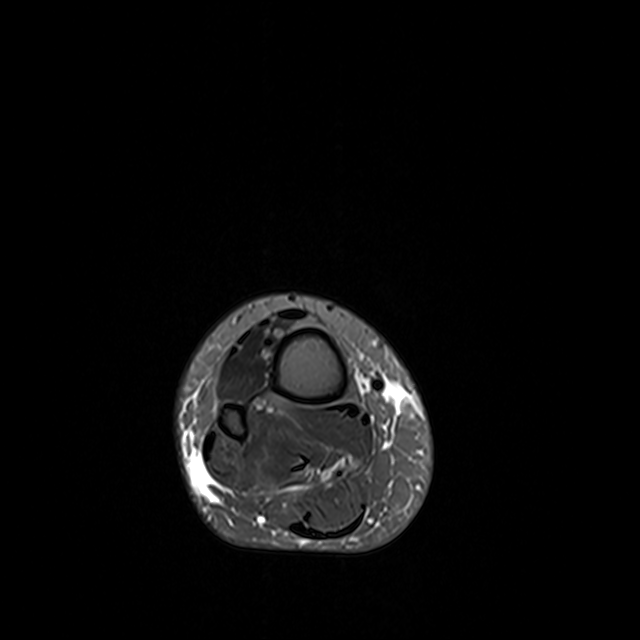

[Series 5: T1 · sagittal · right · 4.0mm · 0.27mm/px · 3 of 20 slices shown]
[im 1/20]
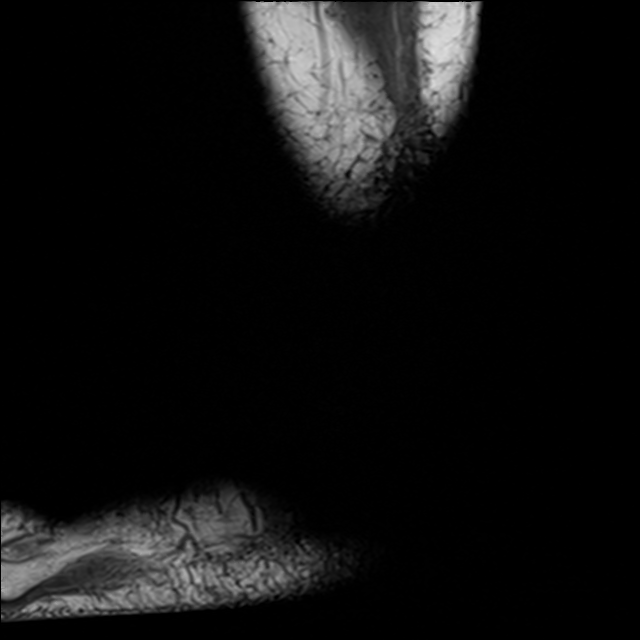
[im 10/20]
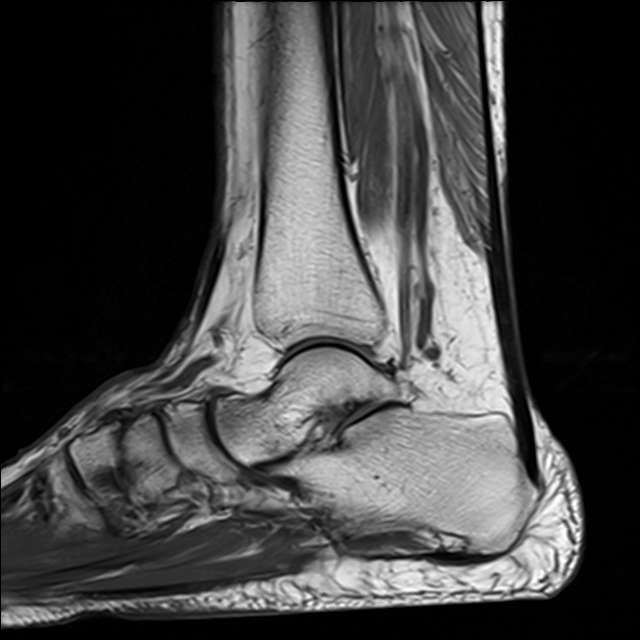
[im 20/20]
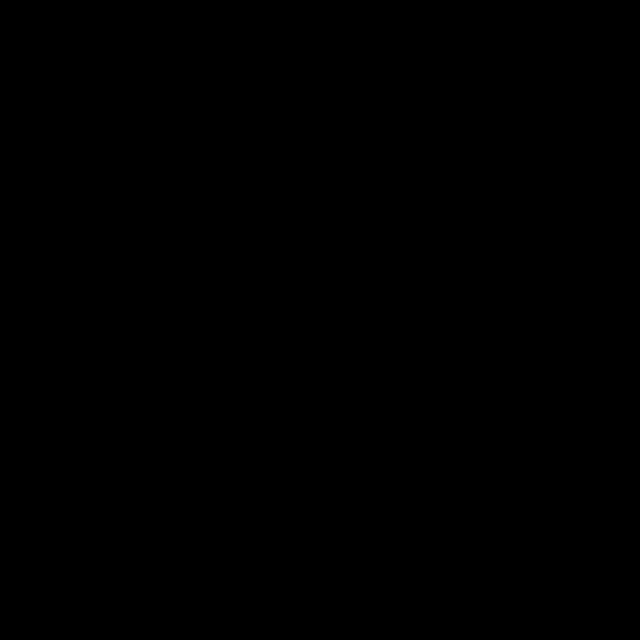

[Series 7: T2 fat-sat · coronal · right · 3.0mm · 0.25mm/px · 3 of 44 slices shown (2 of 2)]
[im 5/44]
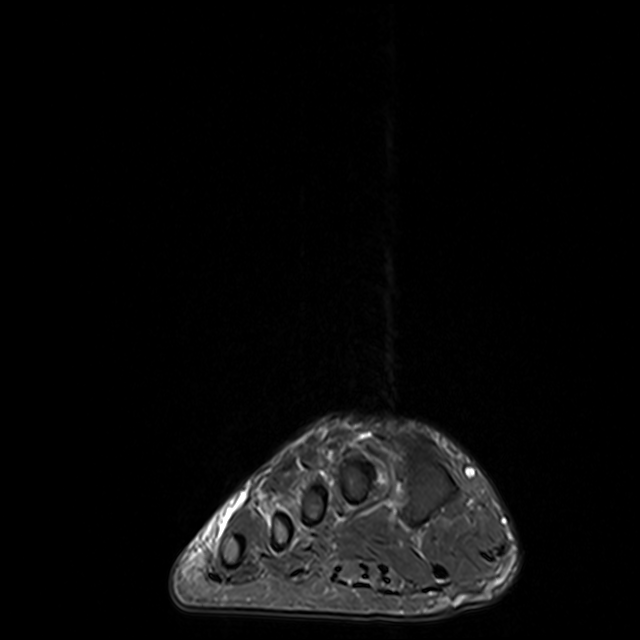
[im 22/44]
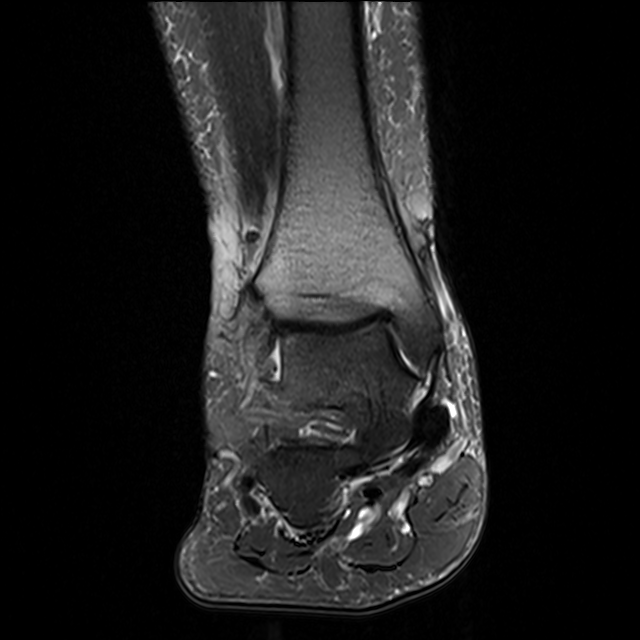
[im 39/44]
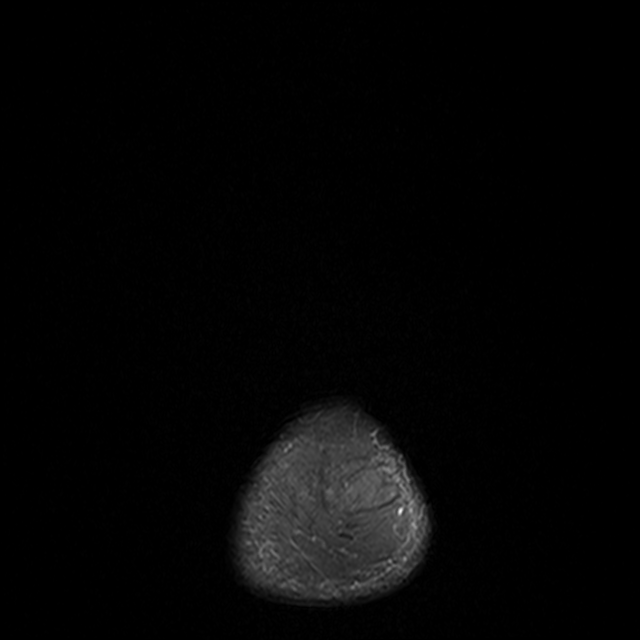

[13 of 40 positions shown; findings below may reference images not displayed]

FINDINGS: TENDONS

Peroneal: Intact

Posteromedial: Intact

Anterior: Intact

Achilles: Normal

Plantar Fascia: Intact

LIGAMENTS

Lateral: Intact

Medial: Intact

CARTILAGE

Ankle Joint: Mild degenerative changes with mild degenerative
chondrosis. No full-thickness cartilage defect or osteochondral
lesion. No joint effusion.

Subtalar Joints/Sinus Tarsi: Mild subtalar joint degenerative
changes but no joint effusion. There are subchondral cystic type
changes mainly along the talar component of the posterior facet. The
sinus tarsi is unremarkable. The cervical and interosseous ligaments
are intact.

Bones: No stress fracture or AVN. There are age advanced midfoot
degenerative changes with joint space narrowing, spurring,
subchondral cystic change and reactive marrow edema.

Other: Unremarkable foot musculature.
IMPRESSION: 1. Intact medial, lateral and anterior ankle tendons and medial and
lateral ankle ligaments.
2. Age advanced midfoot degenerative changes.
3. No stress fracture or AVN.

## 2022-07-16 ENCOUNTER — Encounter: Payer: Self-pay | Admitting: Podiatry

## 2022-07-16 ENCOUNTER — Ambulatory Visit (INDEPENDENT_AMBULATORY_CARE_PROVIDER_SITE_OTHER): Payer: Self-pay | Admitting: Podiatry

## 2022-07-16 DIAGNOSIS — M19071 Primary osteoarthritis, right ankle and foot: Secondary | ICD-10-CM

## 2022-07-16 MED ORDER — DEXAMETHASONE SODIUM PHOSPHATE 120 MG/30ML IJ SOLN
4.0000 mg | Freq: Once | INTRAMUSCULAR | Status: AC
  Administered 2022-07-16: 4 mg via INTRA_ARTICULAR

## 2022-07-16 NOTE — Progress Notes (Signed)
  Subjective:  Patient ID: Deborah Knight, female    DOB: 04/20/1965,  MRN: [id number]  Chief Complaint  Patient presents with   Foot Pain    Right foot pain same since the last visit. Patient states she is still not able to put a shoes on and is still wearing a surgical shoe   DOS: 08/09/20 Procedure: Midfoot exostectomy right   57 y.o. female presents with the above complaint. History confirmed with patient. Injections have been helping and here today for another.  We have not been able to move forward with surgery or MRI due to financial reasons. She is working on getting disability.   Objective:  Physical Exam: tenderness at the surgical site, local edema noted, and calf supple, nontender. Tinel's sign over incision with paresthesias of 2nd/3rd toes. Large soft tissue prominence likely representing scar tissue. Incision: Well healed with mildly hypertrophic scar.  Assessment:   1. Arthritis of right midfoot        Plan:  Patient was evaluated and treated and all questions answered.  Midfoot arthritis -Repeat injection to midfoot per patient request. Injection given deep to the joint and additionally along incision to reduce scar tissue. -MRI still awaiting and will proceed when she has the means.  -Discussed staying in surgical shoe for pain relief.  -Will remain out of work until MRI and possible surgery and will continue with injections regularly to help with pain in meantime. Patient is working on getting disability and agree that patient has limited function and will not be able to work a job that requires being on her feet continuously.  -Continue gabapentin.   -Discussed possible fusion surgery to aid with pain relief but will wait until she has the financial means.   Procedure: Joint Injection Location: Right midfoot joint Skin Prep: Alcohol. Injectate: 1 cc 1% lidocaine plain, 1 cc dexamethasone acetate-betamethasone sodium phosphate Disposition: Patient tolerated  procedure well. Injection site dressed with a band-aid.  No follow-ups on file.

## 2022-08-06 ENCOUNTER — Other Ambulatory Visit: Payer: Self-pay | Admitting: Family Medicine

## 2022-08-06 DIAGNOSIS — I10 Essential (primary) hypertension: Secondary | ICD-10-CM

## 2022-09-10 ENCOUNTER — Ambulatory Visit: Payer: Self-pay | Admitting: Family Medicine

## 2022-10-15 ENCOUNTER — Ambulatory Visit: Payer: Self-pay | Admitting: Podiatry

## 2022-11-04 ENCOUNTER — Other Ambulatory Visit: Payer: Self-pay | Admitting: Family Medicine

## 2022-11-04 DIAGNOSIS — I10 Essential (primary) hypertension: Secondary | ICD-10-CM

## 2022-11-12 ENCOUNTER — Ambulatory Visit: Payer: Self-pay | Admitting: Family Medicine

## 2022-11-27 ENCOUNTER — Other Ambulatory Visit: Payer: Self-pay | Admitting: Family Medicine

## 2022-11-27 DIAGNOSIS — F419 Anxiety disorder, unspecified: Secondary | ICD-10-CM

## 2022-11-27 DIAGNOSIS — F411 Generalized anxiety disorder: Secondary | ICD-10-CM

## 2022-11-27 NOTE — Telephone Encounter (Signed)
Please advise Deborah Knight

## 2023-01-14 ENCOUNTER — Ambulatory Visit: Payer: Self-pay | Admitting: Family Medicine

## 2023-02-24 ENCOUNTER — Other Ambulatory Visit: Payer: Self-pay | Admitting: Family Medicine

## 2023-02-24 DIAGNOSIS — I10 Essential (primary) hypertension: Secondary | ICD-10-CM

## 2023-02-25 ENCOUNTER — Telehealth: Payer: Self-pay

## 2023-02-25 NOTE — Telephone Encounter (Signed)
-----   Message from [place] sent at 02/25/2023  8:24 AM EST ----- Patient is past  due for follow-up, kindly schedule follow-up visit.  Thanks

## 2023-03-15 ENCOUNTER — Other Ambulatory Visit: Payer: Self-pay | Admitting: Family Medicine

## 2023-03-15 DIAGNOSIS — F411 Generalized anxiety disorder: Secondary | ICD-10-CM

## 2023-03-17 NOTE — Telephone Encounter (Signed)
 Please advise until pt see Fola. Thanks [employer]

## 2023-03-21 ENCOUNTER — Other Ambulatory Visit: Payer: Self-pay

## 2023-03-21 DIAGNOSIS — F411 Generalized anxiety disorder: Secondary | ICD-10-CM

## 2023-03-21 NOTE — Telephone Encounter (Signed)
 Please advise Kh

## 2023-03-24 MED ORDER — ALPRAZOLAM 0.25 MG PO TABS
0.2500 mg | ORAL_TABLET | Freq: Two times a day (BID) | ORAL | 0 refills | Status: DC | PRN
Start: 2023-03-24 — End: 2023-06-10

## 2023-03-25 ENCOUNTER — Encounter: Payer: Self-pay | Admitting: Nurse Practitioner

## 2023-03-25 ENCOUNTER — Ambulatory Visit (INDEPENDENT_AMBULATORY_CARE_PROVIDER_SITE_OTHER): Payer: Self-pay | Admitting: Nurse Practitioner

## 2023-03-25 VITALS — BP 137/80 | HR 61 | Temp 97.3°F | Ht 62.0 in | Wt 206.0 lb

## 2023-03-25 DIAGNOSIS — Z1231 Encounter for screening mammogram for malignant neoplasm of breast: Secondary | ICD-10-CM

## 2023-03-25 DIAGNOSIS — M5431 Sciatica, right side: Secondary | ICD-10-CM | POA: Insufficient documentation

## 2023-03-25 DIAGNOSIS — F411 Generalized anxiety disorder: Secondary | ICD-10-CM

## 2023-03-25 DIAGNOSIS — I10 Essential (primary) hypertension: Secondary | ICD-10-CM

## 2023-03-25 DIAGNOSIS — J4520 Mild intermittent asthma, uncomplicated: Secondary | ICD-10-CM

## 2023-03-25 DIAGNOSIS — F331 Major depressive disorder, recurrent, moderate: Secondary | ICD-10-CM

## 2023-03-25 DIAGNOSIS — M549 Dorsalgia, unspecified: Secondary | ICD-10-CM

## 2023-03-25 DIAGNOSIS — K219 Gastro-esophageal reflux disease without esophagitis: Secondary | ICD-10-CM

## 2023-03-25 DIAGNOSIS — G8929 Other chronic pain: Secondary | ICD-10-CM

## 2023-03-25 DIAGNOSIS — J302 Other seasonal allergic rhinitis: Secondary | ICD-10-CM

## 2023-03-25 DIAGNOSIS — E785 Hyperlipidemia, unspecified: Secondary | ICD-10-CM

## 2023-03-25 MED ORDER — FLUTICASONE-SALMETEROL 115-21 MCG/ACT IN AERO: 2.0000 | INHALATION_SPRAY | Freq: Two times a day (BID) | RESPIRATORY_TRACT | 11 refills | Status: AC

## 2023-03-25 MED ORDER — OMEPRAZOLE 20 MG PO CPDR
20.0000 mg | DELAYED_RELEASE_CAPSULE | Freq: Every day | ORAL | 3 refills | Status: AC
Start: 2023-03-25 — End: ?

## 2023-03-25 MED ORDER — AMLODIPINE BESYLATE 10 MG PO TABS
10.0000 mg | ORAL_TABLET | Freq: Every day | ORAL | 1 refills | Status: DC
Start: 2023-03-25 — End: 2023-07-15

## 2023-03-25 MED ORDER — CYCLOBENZAPRINE HCL 10 MG PO TABS
10.0000 mg | ORAL_TABLET | Freq: Every evening | ORAL | 0 refills | Status: DC | PRN
Start: 2023-03-25 — End: 2023-04-16

## 2023-03-25 MED ORDER — CETIRIZINE HCL 10 MG PO TABS
10.0000 mg | ORAL_TABLET | Freq: Every day | ORAL | 11 refills | Status: DC
Start: 2023-03-25 — End: 2024-01-26

## 2023-03-25 NOTE — Assessment & Plan Note (Addendum)
 Flexeril 10 mg at bedtime as needed refilled, also takes gabapentin 300 mg at bedtime  Uses a cane for ambulation states that she is on disability for her chronic pain Handicap placard application form completed

## 2023-03-25 NOTE — Patient Instructions (Signed)
 1. Gastroesophageal reflux disease without esophagitis  - omeprazole  (PRILOSEC) 20 MG capsule; Take 1 capsule (20 mg total) by mouth daily.  Dispense: 90 capsule; Refill: 3  2. Mild intermittent asthma without complication  - fluticasone -salmeterol (ADVAIR HFA) 115-21 MCG/ACT inhaler; Inhale 2 puffs into the lungs 2 (two) times daily.  Dispense: 1 each; Refill: 11  3. Sciatica of right side  - cyclobenzaprine  (FLEXERIL ) 10 MG tablet; Take 1 tablet (10 mg total) by mouth at bedtime as needed for muscle spasms.  Dispense: 20 tablet; Refill: 0  4. Chronic seasonal allergic rhinitis  - cetirizine  (ZYRTEC ) 10 MG tablet; Take 1 tablet (10 mg total) by mouth daily.  Dispense: 30 tablet; Refill: 11  5. Essential hypertension  - amLODipine  (NORVASC ) 10 MG tablet; Take 1 tablet (10 mg total) by mouth daily.  Dispense: 90 tablet; Refill: 1  6. Anxiety state   7. Encounter for screening mammogram for malignant neoplasm of breast (Primary)  - MM 3D SCREENING MAMMOGRAM BILATERAL BREAST; Future    It is important that you exercise regularly at least 30 minutes 5 times a week as tolerated  Think about what you will eat, plan ahead. Choose  clean, green, fresh or frozen over canned, processed or packaged foods which are more sugary, salty and fatty. 70 to 75% of food eaten should be vegetables and fruit. Three meals at set times with snacks allowed between meals, but they must be fruit or vegetables. Aim to eat over a 12 hour period , example 7 am to 7 pm, and STOP after  your last meal of the day. Drink water,generally about 64 ounces per day, no other drink is as healthy. Fruit juice is best enjoyed in a healthy way, by EATING the fruit.  Thanks for choosing [facility] we consider it a privelige to serve you.

## 2023-03-25 NOTE — Assessment & Plan Note (Signed)
 Flowsheet Row Office Visit from 03/25/2023 in [facility]  PHQ-9 Total Score 17     Chronic medical condition uncontrolled Currently on trazodone  25 to 50 mg at bedtime as needed, buspirone  15 mg twice daily.  Takes Xanax  0.25 mg twice daily as needed.  Stated that she used to require more Xanax  in the past. I discussed with the patient that we do not typically manage chronic benzo prescription at this office. While trying to review the patient's medication list and asking  questions about what medications the patient has tried in the past  further evaluate the need for continuous benzo prescription for her anxiety and depression the patient became very upset. She stated that she she would like to see another provider instead of myself. Laneta the research officer, political party was notified.  Laneta had a discussion with the patient and answered all her questions.

## 2023-03-25 NOTE — Assessment & Plan Note (Addendum)
-   fluticasone -salmeterol (ADVAIR HFA) 115-21 MCG/ACT inhaler; Inhale 2 puffs into the lungs 2 (two) times daily.  Dispense: 1 each; Refill: 11 Has Singulair  10 mg daily that she is not taking, takes Zyrtec  10 mg daily Continue Advair inhaler and Zyrtec  10 mg daily

## 2023-03-25 NOTE — Progress Notes (Signed)
 New Patient Office Visit  Subjective:  Patient ID: Deborah Knight, female    DOB: 01-20-1966  Age: 58 y.o. MRN: [id number]  CC:  Chief Complaint  Patient presents with   Medical Management of Chronic Issues    HPI Deborah Knight is a 58 y.o. female  has a past medical history of Allergic rhinitis, seasonal, Anxiety, Arthritis, Chronic low back pain, Depression, GERD (gastroesophageal reflux disease), HLD (hyperlipidemia), Hypertension, Mild intermittent asthma, Numbness of right hand, Peripheral neuropathy, Pre-diabetes, Sciatica, right side, Sleep apnea, and Wears glasses.  Patient present for follow-up for her chronic medical conditions and to establish care with a new provider.  Previous PCP Tilford Longest NP  Please see assessment and plan section for full HPI   Past Medical History:  Diagnosis Date   Allergic rhinitis, seasonal    chronic   Anxiety    Arthritis    right foot, knees, hands, shoulder, back   Chronic low back pain    Depression    GERD (gastroesophageal reflux disease)    HLD (hyperlipidemia)    Hypertension    followed by pcp   (07-29-2019  pt stated never had a stress test)   Mild intermittent asthma    followed by pcp       Numbness of right hand    w/ pain and stiffness   Peripheral neuropathy    Pre-diabetes    followed by pcp   Sciatica, right side    Sleep apnea    Wears glasses     Past Surgical History:  Procedure Laterality Date   ABDOMINAL HERNIA REPAIR  1989   CESAREAN SECTION W/BTL  1997   EXOSTECTECTOMY TOE Right 08/04/2019   Procedure: EXOSTECTECTOMY Right MIDFOOT;  Surgeon: Gretel Ozell PARAS, DPM;  Location: [facility];  Service: Podiatry;  Laterality: Right;   SHOULDER ARTHROSCOPY WITH BICEPS TENDON REPAIR Left 09/25/2012   and Labrum repair    Family History  Problem Relation Age of Onset   Cancer Father    Asthma Brother    Hyperlipidemia Mother     Social History   Socioeconomic History    Marital status: Divorced    Spouse name: Not on file   Number of children: Not on file   Years of education: Not on file   Highest education level: Some college, no degree  Occupational History   Not on file  Tobacco Use   Smoking status: Former    Current packs/day: 0.25    Average packs/day: 0.3 packs/day for 1 year (0.3 ttl pk-yrs)    Types: Cigarettes   Smokeless tobacco: Never  Vaping Use   Vaping status: Never Used  Substance and Sexual Activity   Alcohol use: No   Drug use: Not Currently    Types: Marijuana   Sexual activity: Not on file  Other Topics Concern   Not on file  Social History Narrative   Employed as a education officer, environmental.   Social Drivers of Corporate Investment Banker Strain: Not on file  Food Insecurity: Not on file  Transportation Needs: No Transportation Needs (03/09/2019)   PRAPARE - Administrator, Civil Service (Medical): No    Lack of Transportation (Non-Medical): No  Physical Activity: Not on file  Stress: Not on file  Social Connections: Not on file  Intimate Partner Violence: Not on file    ROS Review of Systems  Constitutional:  Negative for appetite change, chills, fatigue and fever.  HENT:  Negative  for congestion, postnasal drip, rhinorrhea and sneezing.   Respiratory:  Negative for cough, shortness of breath and wheezing.   Cardiovascular:  Negative for chest pain, palpitations and leg swelling.  Gastrointestinal:  Negative for abdominal pain, constipation, nausea and vomiting.  Genitourinary:  Negative for difficulty urinating, dysuria, flank pain and frequency.  Musculoskeletal:  Positive for arthralgias. Negative for joint swelling and myalgias.  Skin:  Negative for color change, pallor, rash and wound.  Neurological:  Negative for dizziness, facial asymmetry, weakness, numbness and headaches.  Psychiatric/Behavioral:  Negative for behavioral problems, confusion, self-injury and suicidal ideas.     Objective:    Today's Vitals: BP 137/80   Pulse 61   Temp (!) 97.3 F (36.3 C)   Ht 5' 2 (1.575 m)   Wt 206 lb (93.4 kg)   LMP 08/13/2012   SpO2 100%   BMI 37.68 kg/m   Physical Exam Vitals and nursing note reviewed.  Constitutional:      General: She is not in acute distress.    Appearance: Normal appearance. She is obese. She is not ill-appearing, toxic-appearing or diaphoretic.  HENT:     Mouth/Throat:     Mouth: Mucous membranes are moist.     Pharynx: Oropharynx is clear. No oropharyngeal exudate or posterior oropharyngeal erythema.  Eyes:     General: No scleral icterus.       Right eye: No discharge.        Left eye: No discharge.     Extraocular Movements: Extraocular movements intact.     Conjunctiva/sclera: Conjunctivae normal.  Cardiovascular:     Rate and Rhythm: Normal rate and regular rhythm.     Pulses: Normal pulses.     Heart sounds: Normal heart sounds. No murmur heard.    No friction rub. No gallop.  Pulmonary:     Effort: Pulmonary effort is normal. No respiratory distress.     Breath sounds: Normal breath sounds. No stridor. No wheezing, rhonchi or rales.  Chest:     Chest wall: No tenderness.  Abdominal:     General: There is no distension.     Palpations: Abdomen is soft.     Tenderness: There is no abdominal tenderness. There is no right CVA tenderness, left CVA tenderness or guarding.  Musculoskeletal:        General: No deformity or signs of injury.     Right lower leg: No edema.     Left lower leg: No edema.  Skin:    General: Skin is warm and dry.     Coloration: Skin is not jaundiced or pale.     Findings: No bruising, erythema or lesion.  Neurological:     Mental Status: She is alert and oriented to person, place, and time.  Psychiatric:        Mood and Affect: Mood normal.        Behavior: Behavior normal.        Thought Content: Thought content normal.        Judgment: Judgment normal.     Assessment & Plan:   Problem List Items  Addressed This Visit       Cardiovascular and Mediastinum   Essential hypertension   BP Readings from Last 3 Encounters:  03/25/23 137/80  03/26/22 120/65  03/12/22 106/82  Continue hydrochlorothiazide  12.5 mg daily, amlodipine  10 mg daily      Relevant Medications   amLODipine  (NORVASC ) 10 MG tablet   Other Relevant Orders   CMP14+EGFR  Respiratory   Chronic seasonal allergic rhinitis   Relevant Medications   cetirizine  (ZYRTEC ) 10 MG tablet   Asthma    - fluticasone -salmeterol (ADVAIR HFA) 115-21 MCG/ACT inhaler; Inhale 2 puffs into the lungs 2 (two) times daily.  Dispense: 1 each; Refill: 11 Has Singulair  10 mg daily that she is not taking, takes Zyrtec  10 mg daily Continue Advair inhaler and Zyrtec  10 mg daily       Relevant Medications   fluticasone -salmeterol (ADVAIR HFA) 115-21 MCG/ACT inhaler   cetirizine  (ZYRTEC ) 10 MG tablet     Digestive   GERD   Omeprazole  20 mg daily refilled Patient encouraged to avoid fatty foods, spicy foods, caffeinated drinks and foods that triggers acid reflux symptoms      Relevant Medications   omeprazole  (PRILOSEC) 20 MG capsule     Other   Hyperlipidemia LDL goal <100   Lab Results  Component Value Date   CHOL 227 (H) 05/29/2021   HDL 61 05/29/2021   LDLCALC 150 (H) 05/29/2021   LDLDIRECT 222 (H) 04/09/2012   TRIG 91 05/29/2021   CHOLHDL 3.7 05/29/2021  Has pravastatin  40 mg daily that she is not taking Has no reason for not taking the medication Will again discuss the need to take medication at next visit      Relevant Medications   amLODipine  (NORVASC ) 10 MG tablet   Anxiety, generalized   Chronic medical condition uncontrolled Currently on trazodone  25 to 50 mg at bedtime as needed, buspirone  15 mg twice daily.  Takes Xanax  0.25 mg twice daily as needed.  Stated that she used to require more Xanax  in the past. I discussed with the patient that we do not typically manage chronic benzo prescription at this  office. While trying to review the patient's medication list and asking  questions about what medications the patient has tried in the past  further evaluate the need for continuous benzo prescription for her anxiety and depression the patient became very upset. She stated that she she would like to see another provider instead of myself. Laneta the research officer, political party was notified.  Laneta had a discussion with the patient and answered all her questions.      Depression   Flowsheet Row Office Visit from 03/25/2023 in [facility]  PHQ-9 Total Score 17     Chronic medical condition uncontrolled Currently on trazodone  25 to 50 mg at bedtime as needed, buspirone  15 mg twice daily.  Takes Xanax  0.25 mg twice daily as needed.  Stated that she used to require more Xanax  in the past. I discussed with the patient that we do not typically manage chronic benzo prescription at this office. While trying to review the patient's medication list and asking  questions about what medications the patient has tried in the past  further evaluate the need for continuous benzo prescription for her anxiety and depression the patient became very upset. She stated that she she would like to see another provider instead of myself. Laneta the research officer, political party was notified.  Laneta had a discussion with the patient and answered all her questions.      Back pain   Flexeril  10 mg at bedtime as needed refilled, also takes gabapentin  300 mg at bedtime  Uses a cane for ambulation states that she is on disability for her chronic pain Handicap placard application form completed      Relevant Medications   cyclobenzaprine  (FLEXERIL )  10 MG tablet   Other Visit Diagnoses       Encounter for screening mammogram for malignant neoplasm of breast    -  Primary   Relevant Orders   MM 3D SCREENING MAMMOGRAM BILATERAL BREAST       Outpatient Encounter  Medications as of 03/25/2023  Medication Sig   acetaminophen  (TYLENOL ) 500 MG tablet Take 500 mg by mouth every 6 (six) hours as needed.   albuterol  (VENTOLIN  HFA) 108 (90 Base) MCG/ACT inhaler Inhale 2 puffs into the lungs every 6 (six) hours as needed for wheezing. Shortness of breath (Patient taking differently: Inhale 2 puffs into the lungs every 6 (six) hours as needed for wheezing. Shortness of breath)   ALPRAZolam  (XANAX ) 0.25 MG tablet Take 1 tablet (0.25 mg total) by mouth 2 (two) times daily as needed.   busPIRone  (BUSPAR ) 15 MG tablet TAKE 1 TABLET BY MOUTH TWICE A DAY   gabapentin  (NEURONTIN ) 300 MG capsule Take 1 capsule (300 mg total) by mouth at bedtime.   hydrochlorothiazide  (HYDRODIURIL ) 12.5 MG tablet TAKE 1 TABLET BY MOUTH DAILY   traZODone  (DESYREL ) 50 MG tablet Take 0.5-1 tablets (25-50 mg total) by mouth at bedtime as needed for sleep.   [DISCONTINUED] amLODipine  (NORVASC ) 10 MG tablet TAKE 1 TABLET BY MOUTH DAILY   amLODipine  (NORVASC ) 10 MG tablet Take 1 tablet (10 mg total) by mouth daily.   cetirizine  (ZYRTEC ) 10 MG tablet Take 1 tablet (10 mg total) by mouth daily.   cyclobenzaprine  (FLEXERIL ) 10 MG tablet Take 1 tablet (10 mg total) by mouth at bedtime as needed for muscle spasms.   fluticasone  (FLONASE ) 50 MCG/ACT nasal spray Place 2 sprays into both nostrils daily as needed (congestion). (Patient not taking: Reported on 03/25/2023)   fluticasone -salmeterol (ADVAIR HFA) 115-21 MCG/ACT inhaler Inhale 2 puffs into the lungs 2 (two) times daily.   montelukast  (SINGULAIR ) 10 MG tablet Take 1 tablet (10 mg total) by mouth at bedtime. (Patient not taking: Reported on 03/25/2023)   omeprazole  (PRILOSEC) 20 MG capsule Take 1 capsule (20 mg total) by mouth daily.   pravastatin  (PRAVACHOL ) 40 MG tablet Take 40 mg by mouth daily.  (Patient not taking: Reported on 03/25/2023)   [DISCONTINUED] cetirizine  (ZYRTEC ) 10 MG tablet Take 1 tablet (10 mg total) by mouth daily. (Patient not  taking: Reported on 03/25/2023)   [DISCONTINUED] cyclobenzaprine  (FLEXERIL ) 10 MG tablet Take 1 tablet (10 mg total) by mouth at bedtime. (Patient not taking: Reported on 03/25/2023)   [DISCONTINUED] fluticasone -salmeterol (ADVAIR HFA) 115-21 MCG/ACT inhaler Inhale 2 puffs into the lungs 2 (two) times daily. (Patient not taking: Reported on 03/25/2023)   [DISCONTINUED] gabapentin  (NEURONTIN ) 300 MG capsule Take 1 capsule (300 mg total) by mouth 3 (three) times daily. (Patient taking differently: Take 300 mg by mouth 3 (three) times daily. Per pt only takes when has back or foot pain)   [DISCONTINUED] metoprolol  tartrate (LOPRESSOR ) 25 MG tablet Take 0.5 tablets (12.5 mg total) by mouth 2 (two) times daily. (Patient not taking: Reported on 03/25/2023)   [DISCONTINUED] metroNIDAZOLE  (FLAGYL ) 500 MG tablet Take 1 tablet (500 mg total) by mouth 2 (two) times daily. (Patient not taking: Reported on 03/25/2023)   [DISCONTINUED] omeprazole  (PRILOSEC) 20 MG capsule Take 1 capsule (20 mg total) by mouth daily. (Patient not taking: Reported on 03/25/2023)   [DISCONTINUED] oxyCODONE -acetaminophen  (PERCOCET) 5-325 MG tablet Take 1 tablet by mouth every 4 (four) hours as needed for severe pain. (Patient not taking: Reported on 03/25/2023)   [DISCONTINUED]  potassium chloride  (K-DUR) 10 MEQ tablet Take 1 tablet (10 mEq total) by mouth daily. (Patient not taking: Reported on 03/25/2023)   [DISCONTINUED] triamcinolone  cream (KENALOG ) 0.1 % Apply 1 application topically 2 (two) times daily. (Patient not taking: Reported on 03/25/2023)   No facility-administered encounter medications on file as of 03/25/2023.    Follow-up: Return in about 6 months (around 09/22/2023) for HTN, HYPERLIPIDEMIA, ANXIETY.   Freemon Binford R Belvie Iribe, FNP

## 2023-03-25 NOTE — Assessment & Plan Note (Signed)
 Chronic medical condition uncontrolled Currently on trazodone  25 to 50 mg at bedtime as needed, buspirone  15 mg twice daily.  Takes Xanax  0.25 mg twice daily as needed.  Stated that she used to require more Xanax  in the past. I discussed with the patient that we do not typically manage chronic benzo prescription at this office. While trying to review the patient's medication list and asking  questions about what medications the patient has tried in the past  further evaluate the need for continuous benzo prescription for her anxiety and depression the patient became very upset. She stated that she she would like to see another provider instead of myself. Laneta the research officer, political party was notified.  Laneta had a discussion with the patient and answered all her questions.

## 2023-03-25 NOTE — Assessment & Plan Note (Signed)
 BP Readings from Last 3 Encounters:  03/25/23 137/80  03/26/22 120/65  03/12/22 106/82  Continue hydrochlorothiazide 12.5 mg daily, amlodipine 10 mg daily

## 2023-03-25 NOTE — Assessment & Plan Note (Signed)
 Lab Results  Component Value Date   CHOL 227 (H) 05/29/2021   HDL 61 05/29/2021   LDLCALC 150 (H) 05/29/2021   LDLDIRECT 222 (H) 04/09/2012   TRIG 91 05/29/2021   CHOLHDL 3.7 05/29/2021  Has pravastatin  40 mg daily that she is not taking Has no reason for not taking the medication Will again discuss the need to take medication at next visit

## 2023-03-25 NOTE — Assessment & Plan Note (Signed)
 Omeprazole 20 mg daily refilled Patient encouraged to avoid fatty foods, spicy foods, caffeinated drinks and foods that triggers acid reflux symptoms

## 2023-03-26 ENCOUNTER — Other Ambulatory Visit: Payer: Self-pay | Admitting: Nurse Practitioner

## 2023-03-26 DIAGNOSIS — E876 Hypokalemia: Secondary | ICD-10-CM

## 2023-03-26 LAB — CMP14+EGFR
ALT: 11 [IU]/L (ref 0–32)
AST: 15 [IU]/L (ref 0–40)
Albumin: 4.5 g/dL (ref 3.8–4.9)
Alkaline Phosphatase: 90 [IU]/L (ref 44–121)
BUN/Creatinine Ratio: 16 (ref 9–23)
BUN: 10 mg/dL (ref 6–24)
Bilirubin Total: 0.2 mg/dL (ref 0.0–1.2)
CO2: 22 mmol/L (ref 20–29)
Calcium: 9.8 mg/dL (ref 8.7–10.2)
Chloride: 102 mmol/L (ref 96–106)
Creatinine, Ser: 0.61 mg/dL (ref 0.57–1.00)
Globulin, Total: 3.5 g/dL (ref 1.5–4.5)
Glucose: 80 mg/dL (ref 70–99)
Potassium: 3.3 mmol/L — ABNORMAL LOW (ref 3.5–5.2)
Sodium: 143 mmol/L (ref 134–144)
Total Protein: 8 g/dL (ref 6.0–8.5)
eGFR: 104 mL/min/{1.73_m2} (ref >59)

## 2023-03-26 MED ORDER — POTASSIUM CHLORIDE ER 10 MEQ PO TBCR: 10.0000 meq | EXTENDED_RELEASE_TABLET | Freq: Every day | ORAL | 0 refills | Status: DC

## 2023-03-27 NOTE — Progress Notes (Signed)
Message was left for patient to return call regarding results.

## 2023-04-15 ENCOUNTER — Ambulatory Visit: Payer: Self-pay | Admitting: Nurse Practitioner

## 2023-04-15 NOTE — Telephone Encounter (Signed)
 Copied from CRM [id number]. Topic: Clinical - Red Word Triage >> Apr 15, 2023  3:19 PM Graeme ORN wrote: Red Word that prompted transfer to Nurse Triage: Lower back pain preventing movement.   Chief Complaint: Lower back pain Symptoms: pain Frequency: years since early 2000s---this episode started today Pertinent Negatives: Patient denies blood in urine, pain with urination, numbness, fever, abdominal pain, Disposition: [] ED /[] Urgent Care (no appt availability in office) / [] Appointment(In office/virtual)/ []  [facility]/ [] Home Care/ [x] Refused Recommended Disposition /[] [facility]/ []  Follow-up with PCP Additional Notes: Patient called and advised that her lower back  Patient states that she is out of muscle relaxers and she has tried ibuprofen  over the counter. Patient states that the pain is severe 10 out of 10.  Patient was advised that the recommendation was that she needed to be seen in the next 4 hours for assessment and treatment.  She said she would get a shot of possibly a steroid when it used to get this bad. She denied any blood in her urine, pain with urination, fever, abdominal pain, numbness, or recent injuries. This RN advised the patient that at this time I did not see any openings with her PCP available in the next 4 hours and I would recommend her going to an urgent care for the next step.  She states that she had gone to an urgent care in the past.  Patient became very upset and frustrated with this entire process and interaction.  She was upset that she explained so much to this RN and I am unable to get her an appointment for today.  Patient ended up hanging up on this RN when this RN tried to speak to her.  Reason for Disposition  [1] SEVERE back pain (e.g., excruciating, unable to do any normal activities) AND [2] not improved 2 hours after pain medicine  Answer Assessment - Initial Assessment Questions 1. ONSET: When did the pain begin?      Years  since early 40s 2. LOCATION: Where does it hurt? (upper, mid or lower back)     Lower back and down left leg this time 3. SEVERITY: How bad is the pain?  (e.g., Scale 1-10; mild, moderate, or severe)   - MILD (1-3): Doesn't interfere with normal activities.    - MODERATE (4-7): Interferes with normal activities or awakens from sleep.    - SEVERE (8-10): Excruciating pain, unable to do any normal activities.      10 4. PATTERN: Is the pain constant? (e.g., yes, no; constant, intermittent)      intermittent 5. RADIATION: Does the pain shoot into your legs or somewhere else?     Down left 6. CAUSE:  What do you think is causing the back pain?      Sciatic nerve 7. BACK OVERUSE:  Any recent lifting of heavy objects, strenuous work or exercise?     na 8. MEDICINES: What have you taken so far for the pain? (e.g., nothing, acetaminophen , NSAIDS)     ibuprofen  9. NEUROLOGIC SYMPTOMS: Do you have any weakness, numbness, or problems with bowel/bladder control?     no 10. OTHER SYMPTOMS: Do you have any other symptoms? (e.g., fever, abdomen pain, burning with urination, blood in urine)       no  Protocols used: Back Pain-A-AH

## 2023-04-15 NOTE — Telephone Encounter (Signed)
Called [facility] and advised them of patient being upset.  They advised that they would pass along my message and get this taken care of.

## 2023-04-16 ENCOUNTER — Ambulatory Visit
Admission: RE | Admit: 2023-04-16 | Discharge: 2023-04-16 | Disposition: A | Discharge status: Home / Self Care | Payer: Self-pay | Source: Ambulatory Visit | Attending: Nurse Practitioner

## 2023-04-16 ENCOUNTER — Ambulatory Visit (INDEPENDENT_AMBULATORY_CARE_PROVIDER_SITE_OTHER): Payer: Self-pay | Admitting: Nurse Practitioner

## 2023-04-16 ENCOUNTER — Encounter: Payer: Self-pay | Admitting: Nurse Practitioner

## 2023-04-16 VITALS — BP 126/86 | HR 70 | Temp 97.0°F | Wt 202.0 lb

## 2023-04-16 DIAGNOSIS — I10 Essential (primary) hypertension: Secondary | ICD-10-CM | POA: Diagnosis not present

## 2023-04-16 DIAGNOSIS — E876 Hypokalemia: Secondary | ICD-10-CM | POA: Insufficient documentation

## 2023-04-16 DIAGNOSIS — G8929 Other chronic pain: Secondary | ICD-10-CM | POA: Diagnosis not present

## 2023-04-16 DIAGNOSIS — M5440 Lumbago with sciatica, unspecified side: Secondary | ICD-10-CM

## 2023-04-16 DIAGNOSIS — Z1231 Encounter for screening mammogram for malignant neoplasm of breast: Secondary | ICD-10-CM

## 2023-04-16 MED ORDER — PREDNISONE 10 MG (21) PO TBPK: ORAL_TABLET | ORAL | 0 refills | Status: DC

## 2023-04-16 MED ORDER — SPIRONOLACTONE 25 MG PO TABS: 12.5000 mg | ORAL_TABLET | Freq: Every day | ORAL | 1 refills | Status: DC

## 2023-04-16 MED ORDER — METHYLPREDNISOLONE NA SUC (PF) 40 MG IJ SOLR
40.0000 mg | Freq: Once | INTRAMUSCULAR | Status: AC
  Administered 2023-04-16: 40 mg via INTRAMUSCULAR

## 2023-04-16 MED ORDER — CYCLOBENZAPRINE HCL 10 MG PO TABS: 10.0000 mg | ORAL_TABLET | Freq: Every evening | ORAL | 0 refills | Status: DC | PRN

## 2023-04-16 NOTE — Assessment & Plan Note (Signed)
 Solu-Medrol  40 mg IM injection given in the office today Start prednisone  tablets tomorrow, encouraged to take medication with food and avoid taking Aleve  or ibuprofen  while taking the medication.  - predniSONE  (STERAPRED UNI-PAK 21 TAB) 10 MG (21) TBPK tablet; Take as instructed on the packaging  Dispense: 1 each; Refill: 0 - cyclobenzaprine  (FLEXERIL ) 10 MG tablet; Take 1 tablet (10 mg total) by mouth at bedtime as needed for muscle spasms.  Dispense: 20 tablet; Refill: 0

## 2023-04-16 NOTE — Assessment & Plan Note (Addendum)
 Due to persistent hypokalemia with hydrochlorothiazide  will discontinue hydrochlorothiazide  and start spironolactone  12.5 mg daily.  Continue amlodipine  10 mg daily. patient agrees to the plan Checking BMP today, BMP in 2 weeks, encouraged to monitor blood pressure at home, follow-up in the office in 4 weeks  DASH diet and commitment to daily physical activity for a minimum of 30 minutes discussed and encouraged, as a part of hypertension management.      04/16/2023    9:16 AM 03/25/2023    8:47 AM 03/26/2022    9:09 AM 03/12/2022    8:54 AM 11/30/2021    8:44 AM 05/29/2021   11:25 AM 05/05/2020    4:26 PM  BP/Weight  Systolic BP 126 137 120 106 169 129 142  Diastolic BP 86 80 65 82 111 84 90  Wt. (Lbs) 202 206  199.8  215.25   BMI 36.95 kg/m2 37.68 kg/m2  36.54 kg/m2  39.37 kg/m2

## 2023-04-16 NOTE — Patient Instructions (Addendum)
 You were given Solu-Medrol  40 mg injection in the office today.  Please start taking prednisone  from tomorrow.  Take medication with food and do not take Aleve  or ibuprofen  while taking prednisone .  Okay to take over-the-counter Tylenol  as needed.  For your blood pressure.  Please stop taking hydrochlorothiazide  and potassium and start spironolactone  12.5 mg daily.  We will check your potassium level today and also recheck it in 2 weeks.   Around 3 times per week, check your blood pressure 2 times per day. once in the morning and once in the evening. The readings should be at least one minute apart. Write down these values and bring them to your next nurse visit/appointment.  When you check your BP, make sure you have been doing something calm/relaxing 5 minutes prior to checking. Both feet should be flat on the floor and you should be sitting. Use your left arm and make sure it is in a relaxed position (on a table), and that the cuff is at the approximate level/height of your heart.     1. Chronic low back pain with sciatica, sciatica laterality unspecified, unspecified back pain laterality (Primary)  - predniSONE  (STERAPRED UNI-PAK 21 TAB) 10 MG (21) TBPK tablet; Take as instructed on the packaging  Dispense: 1 each; Refill: 0 - cyclobenzaprine  (FLEXERIL ) 10 MG tablet; Take 1 tablet (10 mg total) by mouth at bedtime as needed for muscle spasms.  Dispense: 20 tablet; Refill: 0  2. Essential hypertension  - spironolactone  (ALDACTONE ) 25 MG tablet; Take 0.5 tablets (12.5 mg total) by mouth daily.  Dispense: 90 tablet; Refill: 1   It is important that you exercise regularly at least 30 minutes 5 times a week as tolerated  Think about what you will eat, plan ahead. Choose  clean, green, fresh or frozen over canned, processed or packaged foods which are more sugary, salty and fatty. 70 to 75% of food eaten should be vegetables and fruit. Three meals at set times with snacks allowed between  meals, but they must be fruit or vegetables. Aim to eat over a 12 hour period , example 7 am to 7 pm, and STOP after  your last meal of the day. Drink water,generally about 64 ounces per day, no other drink is as healthy. Fruit juice is best enjoyed in a healthy way, by EATING the fruit.  Thanks for choosing [facility] we consider it a privelige to serve you.

## 2023-04-16 NOTE — Progress Notes (Signed)
 Acute Office Visit  Subjective:     Patient ID: Deborah Knight, female    DOB: 31-Aug-1965, 58 y.o.   MRN: [id number]  Chief Complaint  Patient presents with   Back Pain    HPI Ms. [name]  has a past medical history of Allergic rhinitis, seasonal, Anxiety, Arthritis, Chronic low back pain, Depression, GERD (gastroesophageal reflux disease), HLD (hyperlipidemia), Hypertension, Mild intermittent asthma, Numbness of right hand, Peripheral neuropathy, Pre-diabetes, Sciatica, right side, Sleep apnea, and Wears glasses.  Patient presents with complaints of worsening aching low back pain, states that her pain is worse with movement.  Takes OTC ibuprofen  as needed that has not been working.  States that the pain is mainly on the left side of her back but sometimes radiates to the right.  Her pain is currently a 9/10   Review of Systems  Constitutional:  Negative for appetite change, chills, fatigue and fever.  HENT:  Negative for congestion, postnasal drip, rhinorrhea and sneezing.   Respiratory:  Negative for cough, shortness of breath and wheezing.   Cardiovascular:  Negative for chest pain, palpitations and leg swelling.  Gastrointestinal:  Negative for abdominal pain, constipation, nausea and vomiting.  Genitourinary:  Negative for difficulty urinating, dysuria, flank pain and frequency.  Musculoskeletal:  Positive for arthralgias and back pain. Negative for joint swelling and myalgias.  Skin:  Negative for color change, pallor, rash and wound.  Neurological:  Negative for dizziness, facial asymmetry, weakness, numbness and headaches.  Psychiatric/Behavioral:  Negative for behavioral problems, confusion, self-injury and suicidal ideas.     Lab Results  Component Value Date   HGBA1C 5.5 07/20/2019   HGBA1C 5.5 07/20/2019   HGBA1C 5.5 (A) 07/20/2019   HGBA1C 5.5 07/20/2019      The 10-year ASCVD risk score (Arnett DK, et al., 2019) is: 6%   Values used to calculate the  score:     Age: 53 years     Sex: Female     Is Non-Hispanic African American: Yes     Diabetic: No     Tobacco smoker: No     Systolic Blood Pressure: 126 mmHg     Is BP treated: Yes     HDL Cholesterol: 61 mg/dL     Total Cholesterol: 227 mg/dL  Objective:    BP 873/13   Pulse 70   Temp (!) 97 F (36.1 C)   Wt 202 lb (91.6 kg)   LMP 08/13/2012   SpO2 100%   BMI 36.95 kg/m    Physical Exam Vitals and nursing note reviewed.  Constitutional:      General: She is not in acute distress.    Appearance: Normal appearance. She is obese. She is not ill-appearing, toxic-appearing or diaphoretic.  HENT:     Mouth/Throat:     Mouth: Mucous membranes are moist.     Pharynx: Oropharynx is clear. No oropharyngeal exudate or posterior oropharyngeal erythema.  Eyes:     General: No scleral icterus.       Right eye: No discharge.        Left eye: No discharge.     Extraocular Movements: Extraocular movements intact.     Conjunctiva/sclera: Conjunctivae normal.  Cardiovascular:     Rate and Rhythm: Normal rate and regular rhythm.     Pulses: Normal pulses.     Heart sounds: Normal heart sounds. No murmur heard.    No friction rub. No gallop.  Pulmonary:     Effort: Pulmonary effort is normal.  No respiratory distress.     Breath sounds: Normal breath sounds. No stridor. No wheezing, rhonchi or rales.  Chest:     Chest wall: No tenderness.  Abdominal:     General: There is no distension.     Palpations: Abdomen is soft.     Tenderness: There is no abdominal tenderness. There is no right CVA tenderness, left CVA tenderness or guarding.  Musculoskeletal:        General: Tenderness present. No swelling, deformity or signs of injury.     Right lower leg: No edema.     Left lower leg: No edema.     Comments: Tenderness on palpation of left side of low back area.  Skin warm and dry no redness or swelling noted.  Skin:    General: Skin is warm and dry.     Capillary Refill: Capillary  refill takes less than 2 seconds.     Coloration: Skin is not jaundiced or pale.     Findings: No bruising, erythema or lesion.  Neurological:     Mental Status: She is alert and oriented to person, place, and time.  Psychiatric:        Mood and Affect: Mood normal.        Behavior: Behavior normal.        Thought Content: Thought content normal.        Judgment: Judgment normal.     No results found for any visits on 04/16/23.      Assessment & Plan:   Problem List Items Addressed This Visit       Cardiovascular and Mediastinum   Essential hypertension   Due to persistent hypokalemia with hydrochlorothiazide  will discontinue hydrochlorothiazide  and start spironolactone  12.5 mg daily.  Continue amlodipine  10 mg daily. patient agrees to the plan Checking BMP today, BMP in 2 weeks, encouraged to monitor blood pressure at home, follow-up in the office in 4 weeks  DASH diet and commitment to daily physical activity for a minimum of 30 minutes discussed and encouraged, as a part of hypertension management.      04/16/2023    9:16 AM 03/25/2023    8:47 AM 03/26/2022    9:09 AM 03/12/2022    8:54 AM 11/30/2021    8:44 AM 05/29/2021   11:25 AM 05/05/2020    4:26 PM  BP/Weight  Systolic BP 126 137 120 106 169 129 142  Diastolic BP 86 80 65 82 111 84 90  Wt. (Lbs) 202 206  199.8  215.25   BMI 36.95 kg/m2 37.68 kg/m2  36.54 kg/m2  39.37 kg/m2            Relevant Medications   spironolactone  (ALDACTONE ) 25 MG tablet   Other Relevant Orders   Basic Metabolic Panel   Basic Metabolic Panel     Other   Back pain - Primary   Solu-Medrol  40 mg IM injection given in the office today Start prednisone  tablets tomorrow, encouraged to take medication with food and avoid taking Aleve  or ibuprofen  while taking the medication.  - predniSONE  (STERAPRED UNI-PAK 21 TAB) 10 MG (21) TBPK tablet; Take as instructed on the packaging  Dispense: 1 each; Refill: 0 - cyclobenzaprine  (FLEXERIL ) 10 MG  tablet; Take 1 tablet (10 mg total) by mouth at bedtime as needed for muscle spasms.  Dispense: 20 tablet; Refill: 0       Relevant Medications   predniSONE  (STERAPRED UNI-PAK 21 TAB) 10 MG (21) TBPK tablet (Start on 04/17/2023)  cyclobenzaprine  (FLEXERIL ) 10 MG tablet   Hypokalemia   Lab Results  Component Value Date   NA 143 03/25/2023   K 3.3 (L) 03/25/2023   CO2 22 03/25/2023   GLUCOSE 80 03/25/2023   BUN 10 03/25/2023   CREATININE 0.61 03/25/2023   CALCIUM 9.8 03/25/2023   EGFR 104 03/25/2023   GFRNONAA 104 07/21/2019  Due to hydrochlorothiazide  Potassium 10 mEq daily was ordered but the patient has not picked up the prescription from the pharmacy Hydrochlorothiazide  and potassium discontinued today Starting spironolactone  for hypertension BMP today, BMP in 2 weeks       Meds ordered this encounter  Medications   predniSONE  (STERAPRED UNI-PAK 21 TAB) 10 MG (21) TBPK tablet    Sig: Take as instructed on the packaging    Dispense:  1 each    Refill:  0   cyclobenzaprine  (FLEXERIL ) 10 MG tablet    Sig: Take 1 tablet (10 mg total) by mouth at bedtime as needed for muscle spasms.    Dispense:  20 tablet    Refill:  0   spironolactone  (ALDACTONE ) 25 MG tablet    Sig: Take 0.5 tablets (12.5 mg total) by mouth daily.    Dispense:  90 tablet    Refill:  1    Return in about 4 weeks (around 05/14/2023) for CPE.  Deonte Otting R Fawnda Vitullo, FNP

## 2023-04-16 NOTE — Assessment & Plan Note (Signed)
 Lab Results  Component Value Date   NA 143 03/25/2023   K 3.3 (L) 03/25/2023   CO2 22 03/25/2023   GLUCOSE 80 03/25/2023   BUN 10 03/25/2023   CREATININE 0.61 03/25/2023   CALCIUM 9.8 03/25/2023   EGFR 104 03/25/2023   GFRNONAA 104 07/21/2019  Due to hydrochlorothiazide  Potassium 10 mEq daily was ordered but the patient has not picked up the prescription from the pharmacy Hydrochlorothiazide  and potassium discontinued today Starting spironolactone  for hypertension BMP today, BMP in 2 weeks

## 2023-04-17 LAB — BASIC METABOLIC PANEL
BUN/Creatinine Ratio: 20 (ref 9–23)
BUN: 12 mg/dL (ref 6–24)
CO2: 25 mmol/L (ref 20–29)
Calcium: 10 mg/dL (ref 8.7–10.2)
Chloride: 105 mmol/L (ref 96–106)
Creatinine, Ser: 0.6 mg/dL (ref 0.57–1.00)
Glucose: 89 mg/dL (ref 70–99)
Potassium: 3.5 mmol/L (ref 3.5–5.2)
Sodium: 145 mmol/L — ABNORMAL HIGH (ref 134–144)
eGFR: 104 mL/min/{1.73_m2} (ref >59)

## 2023-04-25 ENCOUNTER — Other Ambulatory Visit: Payer: Self-pay | Admitting: Family Medicine

## 2023-04-25 ENCOUNTER — Other Ambulatory Visit: Payer: Self-pay

## 2023-04-25 DIAGNOSIS — F411 Generalized anxiety disorder: Secondary | ICD-10-CM

## 2023-04-25 DIAGNOSIS — F32A Depression, unspecified: Secondary | ICD-10-CM

## 2023-04-25 NOTE — Telephone Encounter (Signed)
Please advise [facility]

## 2023-04-28 ENCOUNTER — Other Ambulatory Visit: Payer: Self-pay

## 2023-04-29 ENCOUNTER — Other Ambulatory Visit: Payer: Medicare Other

## 2023-04-29 ENCOUNTER — Ambulatory Visit: Payer: Self-pay | Admitting: Nurse Practitioner

## 2023-04-29 ENCOUNTER — Other Ambulatory Visit: Payer: Self-pay | Admitting: Nurse Practitioner

## 2023-04-29 DIAGNOSIS — I10 Essential (primary) hypertension: Secondary | ICD-10-CM

## 2023-04-30 LAB — BASIC METABOLIC PANEL
BUN/Creatinine Ratio: 21 (ref 9–23)
BUN: 11 mg/dL (ref 6–24)
CO2: 23 mmol/L (ref 20–29)
Calcium: 9.3 mg/dL (ref 8.7–10.2)
Chloride: 107 mmol/L — ABNORMAL HIGH (ref 96–106)
Creatinine, Ser: 0.52 mg/dL — ABNORMAL LOW (ref 0.57–1.00)
Glucose: 83 mg/dL (ref 70–99)
Potassium: 4.1 mmol/L (ref 3.5–5.2)
Sodium: 143 mmol/L (ref 134–144)
eGFR: 108 mL/min/{1.73_m2} (ref >59)

## 2023-05-16 ENCOUNTER — Ambulatory Visit: Payer: Self-pay | Admitting: Nurse Practitioner

## 2023-06-10 ENCOUNTER — Other Ambulatory Visit: Payer: Self-pay | Admitting: Nurse Practitioner

## 2023-06-10 ENCOUNTER — Other Ambulatory Visit: Payer: Self-pay | Admitting: Internal Medicine

## 2023-06-10 ENCOUNTER — Encounter: Payer: Self-pay | Admitting: Nurse Practitioner

## 2023-06-10 ENCOUNTER — Ambulatory Visit (INDEPENDENT_AMBULATORY_CARE_PROVIDER_SITE_OTHER): Payer: Self-pay | Admitting: Nurse Practitioner

## 2023-06-10 VITALS — BP 127/77 | HR 64 | Temp 97.0°F | Wt 208.0 lb

## 2023-06-10 DIAGNOSIS — Z1321 Encounter for screening for nutritional disorder: Secondary | ICD-10-CM | POA: Diagnosis not present

## 2023-06-10 DIAGNOSIS — J302 Other seasonal allergic rhinitis: Secondary | ICD-10-CM

## 2023-06-10 DIAGNOSIS — M25511 Pain in right shoulder: Secondary | ICD-10-CM

## 2023-06-10 DIAGNOSIS — F411 Generalized anxiety disorder: Secondary | ICD-10-CM

## 2023-06-10 DIAGNOSIS — Z13220 Encounter for screening for lipoid disorders: Secondary | ICD-10-CM | POA: Diagnosis not present

## 2023-06-10 DIAGNOSIS — Z131 Encounter for screening for diabetes mellitus: Secondary | ICD-10-CM | POA: Diagnosis not present

## 2023-06-10 DIAGNOSIS — I10 Essential (primary) hypertension: Secondary | ICD-10-CM | POA: Diagnosis not present

## 2023-06-10 DIAGNOSIS — Z0000 Encounter for general adult medical examination without abnormal findings: Secondary | ICD-10-CM | POA: Diagnosis not present

## 2023-06-10 DIAGNOSIS — E785 Hyperlipidemia, unspecified: Secondary | ICD-10-CM

## 2023-06-10 DIAGNOSIS — E66812 Obesity, class 2: Secondary | ICD-10-CM

## 2023-06-10 DIAGNOSIS — R7303 Prediabetes: Secondary | ICD-10-CM | POA: Diagnosis not present

## 2023-06-10 DIAGNOSIS — M25512 Pain in left shoulder: Secondary | ICD-10-CM

## 2023-06-10 DIAGNOSIS — Z13228 Encounter for screening for other metabolic disorders: Secondary | ICD-10-CM

## 2023-06-10 DIAGNOSIS — E6609 Other obesity due to excess calories: Secondary | ICD-10-CM

## 2023-06-10 DIAGNOSIS — G8929 Other chronic pain: Secondary | ICD-10-CM

## 2023-06-10 DIAGNOSIS — M5431 Sciatica, right side: Secondary | ICD-10-CM | POA: Diagnosis not present

## 2023-06-10 DIAGNOSIS — Z1329 Encounter for screening for other suspected endocrine disorder: Secondary | ICD-10-CM | POA: Diagnosis not present

## 2023-06-10 DIAGNOSIS — Z130 Encounter for screening for diseases of the blood and blood-forming organs and certain disorders involving the immune mechanism: Secondary | ICD-10-CM

## 2023-06-10 DIAGNOSIS — F329 Major depressive disorder, single episode, unspecified: Secondary | ICD-10-CM

## 2023-06-10 MED ORDER — IBUPROFEN 600 MG PO TABS
600.0000 mg | ORAL_TABLET | Freq: Three times a day (TID) | ORAL | 1 refills | Status: AC | PRN
Start: 2023-06-10 — End: ?

## 2023-06-10 MED ORDER — FLUTICASONE PROPIONATE 50 MCG/ACT NA SUSP
2.0000 | Freq: Every day | NASAL | 3 refills | Status: AC | PRN
Start: 2023-06-10 — End: ?

## 2023-06-10 MED ORDER — WEGOVY 0.25 MG/0.5ML ~~LOC~~ SOAJ: 0.2500 mg | SUBCUTANEOUS | 0 refills | Status: DC

## 2023-06-10 MED ORDER — ALPRAZOLAM 0.25 MG PO TABS: 0.2500 mg | ORAL_TABLET | Freq: Two times a day (BID) | ORAL | 0 refills | Status: DC | PRN

## 2023-06-10 NOTE — Assessment & Plan Note (Addendum)
 Wt Readings from Last 3 Encounters:  06/10/23 208 lb (94.3 kg)  04/16/23 202 lb (91.6 kg)  03/25/23 206 lb (93.4 kg)   Body mass index is 38.04 kg/m.  Hs seen a nutritionis before years ago, declined referral to nutritionist today but she is interested in losing weight Patient counseled on low-carb diet, encouraged to engage in regular moderate exercises at least 150 minutes weekly Patient denies personal or family history of MTC or MEN 2.  They denied personal history of pancreatitis.   Start Wegovy 0.25 mg once weekly Patient encouraged to avoid fatty fried foods eat smaller portions of meal to help decrease nausea.  Encouraged to report abdominal pain, nausea, vomiting.  Follow-up in 4 weeks

## 2023-06-10 NOTE — Assessment & Plan Note (Signed)
 Continue Flexeril 10 mg at bedtime as needed, gabapentin 300 mg at bedtime Start ibuprofen 600 mg 3 times daily as needed alternate with Tylenol 650 mg every 6 hours as needed Patient referred to orthopedics

## 2023-06-10 NOTE — Assessment & Plan Note (Addendum)
 Lab Results  Component Value Date   CHOL 227 (H) 05/29/2021   HDL 61 05/29/2021   LDLCALC 150 (H) 05/29/2021   LDLDIRECT 222 (H) 04/09/2012   TRIG 91 05/29/2021   CHOLHDL 3.7 05/29/2021  Has atorvastatin ordered but not taking med  Check lipid panel

## 2023-06-10 NOTE — Progress Notes (Signed)
 Complete physical exam  Patient: Deborah Knight   DOB: 09/29/1965   58 y.o. Female  MRN: [id number]  Subjective:    Chief Complaint  Patient presents with   Annual Exam    Deborah Knight is a 58 y.o. female  has a past medical history of Allergic rhinitis, seasonal, Anxiety, Arthritis, Chronic low back pain, Depression, GERD (gastroesophageal reflux disease), HLD (hyperlipidemia), Hypertension, Mild intermittent asthma, Numbness of right hand, Peripheral neuropathy, Pre-diabetes, Sciatica, right side, Sleep apnea, and Wears glasses. who presents today for a complete physical exam. She reports consuming a general diet  doing portion control.  Does walking exercise 15 minutes 3 days a week   She generally feels fairly well. She reports sleeping fairly well.    Patient complains of bilateral shoulder pain, has intermittent numbness in both hands, states that she has been having difficulty grasping things and dropping things.  Takes gabapentin 300 mg at bedtime, Flexeril 10 mg as needed.  Also takes over-the-counter ibuprofen as needed.  She plans on getting Tdap vaccine and shingles vaccine at the pharmacy today    Most recent fall risk assessment:    06/10/2023    8:08 AM  Fall Risk   Falls in the past year? 1  Number falls in past yr: 0  Injury with Fall? 0  Risk for fall due to : Impaired balance/gait  Follow up Falls evaluation completed     Most recent depression screenings:    06/10/2023    8:11 AM 03/25/2023    8:33 AM  PHQ 2/9 Scores  PHQ - 2 Score 2 6  PHQ- 9 Score 9 17        Patient Care Team: Donell Beers, FNP as PCP - General (Nurse Practitioner)   Outpatient Medications Prior to Visit  Medication Sig   acetaminophen (TYLENOL) 500 MG tablet Take 500 mg by mouth every 6 (six) hours as needed.   albuterol (VENTOLIN HFA) 108 (90 Base) MCG/ACT inhaler Inhale 2 puffs into the lungs every 6 (six) hours as needed for wheezing. Shortness of breath  (Patient taking differently: Inhale 2 puffs into the lungs every 6 (six) hours as needed for wheezing. Shortness of breath)   ALPRAZolam (XANAX) 0.25 MG tablet Take 1 tablet (0.25 mg total) by mouth 2 (two) times daily as needed.   amLODipine (NORVASC) 10 MG tablet Take 1 tablet (10 mg total) by mouth daily.   busPIRone (BUSPAR) 15 MG tablet TAKE 1 TABLET BY MOUTH 2 TIMES A DAY   cetirizine (ZYRTEC) 10 MG tablet Take 1 tablet (10 mg total) by mouth daily.   cyclobenzaprine (FLEXERIL) 10 MG tablet Take 1 tablet (10 mg total) by mouth at bedtime as needed for muscle spasms.   fluticasone-salmeterol (ADVAIR HFA) 115-21 MCG/ACT inhaler Inhale 2 puffs into the lungs 2 (two) times daily.   gabapentin (NEURONTIN) 300 MG capsule Take 1 capsule (300 mg total) by mouth at bedtime.   montelukast (SINGULAIR) 10 MG tablet Take 1 tablet (10 mg total) by mouth at bedtime.   omeprazole (PRILOSEC) 20 MG capsule Take 1 capsule (20 mg total) by mouth daily.   pravastatin (PRAVACHOL) 40 MG tablet Take 40 mg by mouth daily.   spironolactone (ALDACTONE) 25 MG tablet Take 0.5 tablets (12.5 mg total) by mouth daily.   traZODone (DESYREL) 50 MG tablet Take 0.5-1 tablets (25-50 mg total) by mouth at bedtime as needed for sleep.   [DISCONTINUED] fluticasone (FLONASE) 50 MCG/ACT nasal spray Place 2 sprays into  both nostrils daily as needed (congestion).   [DISCONTINUED] predniSONE (STERAPRED UNI-PAK 21 TAB) 10 MG (21) TBPK tablet Take as instructed on the packaging (Patient not taking: Reported on 06/10/2023)   No facility-administered medications prior to visit.    Review of Systems  Constitutional:  Negative for appetite change, chills, fatigue and fever.  HENT:  Negative for congestion, postnasal drip, rhinorrhea and sneezing.   Eyes:  Negative for pain, discharge and itching.  Respiratory:  Negative for cough, shortness of breath and wheezing.   Cardiovascular:  Negative for chest pain, palpitations and leg swelling.   Gastrointestinal:  Negative for abdominal pain, constipation, nausea and vomiting.  Endocrine: Negative for cold intolerance, heat intolerance and polydipsia.  Genitourinary:  Negative for difficulty urinating, dysuria, flank pain and frequency.  Musculoskeletal:  Positive for arthralgias and back pain. Negative for joint swelling and myalgias.  Skin:  Negative for color change, pallor, rash and wound.  Neurological:  Positive for numbness. Negative for seizures, facial asymmetry, speech difficulty and headaches.  Psychiatric/Behavioral:  Positive for sleep disturbance. Negative for behavioral problems, confusion, self-injury and suicidal ideas.        Objective:     BP 127/77   Pulse 64   Temp (!) 97 F (36.1 C)   Wt 208 lb (94.3 kg)   LMP 08/13/2012   SpO2 100%   BMI 38.04 kg/m    Physical Exam Vitals and nursing note reviewed. Exam conducted with a chaperone present.  Constitutional:      General: She is not in acute distress.    Appearance: Normal appearance. She is obese. She is not ill-appearing, toxic-appearing or diaphoretic.  HENT:     Right Ear: Tympanic membrane, ear canal and external ear normal. There is no impacted cerumen.     Left Ear: Tympanic membrane, ear canal and external ear normal. There is no impacted cerumen.     Nose: Nose normal. No congestion or rhinorrhea.     Mouth/Throat:     Mouth: Mucous membranes are moist.     Pharynx: Oropharynx is clear. No oropharyngeal exudate or posterior oropharyngeal erythema.  Eyes:     General: No scleral icterus.       Right eye: No discharge.        Left eye: No discharge.     Extraocular Movements: Extraocular movements intact.     Conjunctiva/sclera: Conjunctivae normal.  Neck:     Vascular: No carotid bruit.  Cardiovascular:     Rate and Rhythm: Normal rate and regular rhythm.     Pulses: Normal pulses.     Heart sounds: Normal heart sounds. No murmur heard.    No friction rub. No gallop.  Pulmonary:      Effort: Pulmonary effort is normal. No respiratory distress.     Breath sounds: Normal breath sounds. No stridor. No wheezing, rhonchi or rales.  Chest:     Chest wall: No tenderness.  Abdominal:     General: Bowel sounds are normal. There is no distension.     Palpations: Abdomen is soft. There is no mass.     Tenderness: There is no abdominal tenderness. There is no right CVA tenderness, left CVA tenderness, guarding or rebound.     Hernia: No hernia is present.  Musculoskeletal:        General: Tenderness present. No swelling, deformity or signs of injury.     Cervical back: Normal range of motion and neck supple. No rigidity or tenderness.  Right lower leg: No edema.     Left lower leg: No edema.     Comments: Limited range of motion of bilateral shoulders, reported tenderness on range of motion of both shoulder.  Decreased range of motion of the spine.  No redness, swelling noted.  Has palpable radial pulses  Lymphadenopathy:     Cervical: No cervical adenopathy.  Skin:    General: Skin is warm and dry.     Capillary Refill: Capillary refill takes less than 2 seconds.     Coloration: Skin is not jaundiced or pale.     Findings: No bruising, erythema, lesion or rash.  Neurological:     Mental Status: She is alert and oriented to person, place, and time.     Cranial Nerves: No cranial nerve deficit.     Sensory: No sensory deficit.     Motor: No weakness.     Coordination: Coordination normal.     Gait: Gait normal.     Deep Tendon Reflexes: Reflexes normal.  Psychiatric:        Mood and Affect: Mood normal.        Behavior: Behavior normal.        Thought Content: Thought content normal.        Judgment: Judgment normal.     No results found for any visits on 06/10/23.     Assessment & Plan:    Routine Health Maintenance and Physical Exam  Immunization History  Administered Date(s) Administered   DTP 08/03/2004   Influenza Split 04/09/2012   Influenza  Whole 02/09/2004, 02/03/2007, 02/22/2008, 06/12/2009, 01/23/2010   Influenza,inj,Quad PF,6+ Mos 12/14/2012, 11/17/2013, 01/31/2015, 11/22/2015, 11/18/2018, 03/14/2020   Influenza-Unspecified 03/16/2023   Pneumococcal Conjugate-13 09/17/2017   Pneumococcal Polysaccharide-23 01/11/2004, 06/12/2009   Td 06/29/2009    Health Maintenance  Topic Date Due   Medicare Annual Wellness (AWV)  Never done   Zoster Vaccines- Shingrix (1 of 2) Never done   DTaP/Tdap/Td (3 - Tdap) 06/30/2019   COVID-19 Vaccine (1 - 2024-25 season) Never done   INFLUENZA VACCINE  10/10/2023   MAMMOGRAM  04/15/2025   Cervical Cancer Screening (HPV/Pap Cotest)  05/30/2026   Colonoscopy  10/13/2028   Pneumococcal Vaccine 2-53 Years old (3 of 3 - PPSV23 or PCV20) 12/16/2029   Hepatitis C Screening  Completed   HIV Screening  Completed   HPV VACCINES  Aged Out    Discussed health benefits of physical activity, and encouraged her to engage in regular exercise appropriate for her age and condition.  Problem List Items Addressed This Visit       Cardiovascular and Mediastinum   Essential hypertension   Controlled on amlodipine 10 mg daily, spironolactone 12.5 mg daily DASH diet and commitment to daily physical activity for a minimum of 30 minutes discussed and encouraged, as a part of hypertension management. The importance of attaining a healthy weight is also discussed.     06/10/2023    8:05 AM 04/16/2023    9:16 AM 03/25/2023    8:47 AM 03/26/2022    9:09 AM 03/12/2022    8:54 AM 11/30/2021    8:44 AM 05/29/2021   11:25 AM  BP/Weight  Systolic BP 127 126 137 120 106 169 129  Diastolic BP 77 86 80 65 82 111 84  Wt. (Lbs) 208 202 206  199.8  215.25  BMI 38.04 kg/m2 36.95 kg/m2 37.68 kg/m2  36.54 kg/m2  39.37 kg/m2  Respiratory   Chronic seasonal allergic rhinitis   Relevant Medications   fluticasone (FLONASE) 50 MCG/ACT nasal spray     Nervous and Auditory   Sciatica of right side   Continue  Flexeril 10 mg at bedtime as needed, gabapentin 300 mg at bedtime Start ibuprofen 600 mg 3 times daily as needed alternate with Tylenol 650 mg every 6 hours as needed Patient referred to orthopedics      Relevant Medications   Semaglutide-Weight Management (WEGOVY) 0.25 MG/0.5ML SOAJ   ibuprofen (ADVIL) 600 MG tablet   Other Relevant Orders   Ambulatory referral to Orthopedic Surgery     Other   Hyperlipidemia LDL goal <100   Lab Results  Component Value Date   CHOL 227 (H) 05/29/2021   HDL 61 05/29/2021   LDLCALC 150 (H) 05/29/2021   LDLDIRECT 222 (H) 04/09/2012   TRIG 91 05/29/2021   CHOLHDL 3.7 05/29/2021  Has atorvastatin ordered but not taking med  Check lipid panel      Obesity   Wt Readings from Last 3 Encounters:  06/10/23 208 lb (94.3 kg)  04/16/23 202 lb (91.6 kg)  03/25/23 206 lb (93.4 kg)   Body mass index is 38.04 kg/m.  Hs seen a nutritionis before years ago, declined referral to nutritionist today but she is interested in losing weight Patient counseled on low-carb diet, encouraged to engage in regular moderate exercises at least 150 minutes weekly Patient denies personal or family history of MTC or MEN 2.  They denied personal history of pancreatitis.   Start Wegovy 0.25 mg once weekly Patient encouraged to avoid fatty fried foods eat smaller portions of meal to help decrease nausea.  Encouraged to report abdominal pain, nausea, vomiting.  Follow-up in 4 weeks       Relevant Medications   Semaglutide-Weight Management (WEGOVY) 0.25 MG/0.5ML SOAJ   Anxiety, generalized      06/10/2023    9:11 AM 03/25/2023    8:37 AM 03/30/2019   11:56 AM  GAD 7 : Generalized Anxiety Score  Nervous, Anxious, on Edge 2 2 2   Control/stop worrying 2 2 2   Worry too much - different things 2 2 2   Trouble relaxing 1 1 2   Restless 1 2 2   Easily annoyed or irritable 2 2 2   Afraid - awful might happen 1 1   Total GAD 7 Score 11 12   Anxiety Difficulty Very difficult Very  difficult   Burgess Estelle was the birthday of her late son, he would have been 63 years old Takes buspirone 15 mg twice daily, Xanax 0.25 mg twice daily as needed She is interested in referral to psychiatrist        Relevant Orders   Ambulatory referral to Psychiatry   Depression      06/10/2023    8:11 AM 03/25/2023    8:33 AM 03/12/2022    8:49 AM 05/29/2021   11:44 AM 03/30/2019   10:52 AM  Depression screen PHQ 2/9  Decreased Interest 1 3 3   0  Down, Depressed, Hopeless 1 3 3 3  0  PHQ - 2 Score 2 6 6 3  0  Altered sleeping 1 3 3 3    Tired, decreased energy 1 1 1  0   Change in appetite 2 0 1 0   Feeling bad or failure about yourself  1 2 0 0   Trouble concentrating 1 2 3  0   Moving slowly or fidgety/restless 1 2 0 0   Suicidal thoughts 0 1  0 0   PHQ-9 Score 9 17 14 6    Difficult doing work/chores Somewhat difficult Somewhat difficult Somewhat difficult    Takes trazodone 50 mg daily at bedtime for insomnia Continue current medication Patient referred to psychiatrist      Relevant Orders   Ambulatory referral to Psychiatry   Prediabetes   Lab Results  Component Value Date   HGBA1C 5.5 07/20/2019   HGBA1C 5.5 07/20/2019   HGBA1C 5.5 (A) 07/20/2019   HGBA1C 5.5 07/20/2019  Rechecking A1c Patient counseled on low-carb diet      Annual physical exam - Primary   Annual exam as documented.  Counseling done include healthy lifestyle involving committing to 150 minutes of exercise per week, heart healthy diet, and attaining healthy weight. The importance of adequate sleep also discussed.  Regular use of seat belt and home safety were also discussed . Changes in health habits are decided on by patient with goals and time frames set for achieving them. Immunization and cancer screening  needs are specifically addressed at this visit.    Up-to-date with mammogram, colon cancer screening and cervical cancer screening Getting Tdap vaccine and shingles vaccine at the pharmacy       Chronic pain of both shoulders   Continue Flexeril 10 mg at bedtime as needed, gabapentin 300 mg at bedtime Start ibuprofen 600 mg 3 times daily as needed alternate with Tylenol 650 mg every 6 hours as needed Patient referred to orthopedics      Relevant Medications   ibuprofen (ADVIL) 600 MG tablet   Other Relevant Orders   Ambulatory referral to Orthopedic Surgery   Other Visit Diagnoses       Screening for endocrine, nutritional, metabolic and immunity disorder       Relevant Orders   CBC   Lipid panel   Hemoglobin A1c      Return in about 4 weeks (around 07/08/2023) for obesity.     Donell Beers, FNP

## 2023-06-10 NOTE — Patient Instructions (Signed)
 Please get your shingles vaccine and Tdap vaccine at the pharmacy as discussed  Take ibuprofen 600 mg every 8 hours as needed for pain.Please alternate ibuprofen with Tylenol 650 mg every 6 hours as needed.  Take ibuprofen with food   Chronic seasonal allergic rhinitis (Primary)  - fluticasone (FLONASE) 50 MCG/ACT nasal spray; Place 2 sprays into both nostrils daily as needed (congestion).  Dispense: 16 g; Refill: 3     Class 2 obesity due to excess calories in adult, unspecified BMI, unspecified whether serious comorbidity present  - Semaglutide-Weight Management (WEGOVY) 0.25 MG/0.5ML SOAJ; Inject 0.25 mg into the skin once a week.  Dispense: 2 mL; Refill: 0   Chronic pain of both shoulders  - Ambulatory referral to Orthopedic Surgery - ibuprofen (ADVIL) 600 MG tablet; Take 1 tablet (600 mg total) by mouth every 8 (eight) hours as needed.  Dispense: 30 tablet; Refill: 1  . Anxiety, generalized  - Ambulatory referral to Psychiatry  . Major depressive disorder with current active episode, unspecified depression episode severity, unspecified whether recurrent  - Ambulatory referral to Psychiatry  . Sciatica of right side  - ibuprofen (ADVIL) 600 MG tablet; Take 1 tablet (600 mg total) by mouth every 8 (eight) hours as needed.  Dispense: 30 tablet; Refill: 1    It is important that you exercise regularly at least 30 minutes 5 times a week as tolerated  Think about what you will eat, plan ahead. Choose " clean, green, fresh or frozen" over canned, processed or packaged foods which are more sugary, salty and fatty. 70 to 75% of food eaten should be vegetables and fruit. Three meals at set times with snacks allowed between meals, but they must be fruit or vegetables. Aim to eat over a 12 hour period , example 7 am to 7 pm, and STOP after  your last meal of the day. Drink water,generally about 64 ounces per day, no other drink is as healthy. Fruit juice is best enjoyed in a healthy  way, by EATING the fruit.  Thanks for choosing [facility] we consider it a privelige to serve you.

## 2023-06-10 NOTE — Assessment & Plan Note (Signed)
 Annual exam as documented.  Counseling done include healthy lifestyle involving committing to 150 minutes of exercise per week, heart healthy diet, and attaining healthy weight. The importance of adequate sleep also discussed.  Regular use of seat belt and home safety were also discussed . Changes in health habits are decided on by patient with goals and time frames set for achieving them. Immunization and cancer screening  needs are specifically addressed at this visit.    Up-to-date with mammogram, colon cancer screening and cervical cancer screening Getting Tdap vaccine and shingles vaccine at the pharmacy

## 2023-06-10 NOTE — Assessment & Plan Note (Addendum)
 Controlled on amlodipine 10 mg daily, spironolactone 12.5 mg daily DASH diet and commitment to daily physical activity for a minimum of 30 minutes discussed and encouraged, as a part of hypertension management. The importance of attaining a healthy weight is also discussed.     06/10/2023    8:05 AM 04/16/2023    9:16 AM 03/25/2023    8:47 AM 03/26/2022    9:09 AM 03/12/2022    8:54 AM 11/30/2021    8:44 AM 05/29/2021   11:25 AM  BP/Weight  Systolic BP 127 126 137 120 106 169 129  Diastolic BP 77 86 80 65 82 111 84  Wt. (Lbs) 208 202 206  199.8  215.25  BMI 38.04 kg/m2 36.95 kg/m2 37.68 kg/m2  36.54 kg/m2  39.37 kg/m2

## 2023-06-10 NOTE — Assessment & Plan Note (Signed)
    06/10/2023    9:11 AM 03/25/2023    8:37 AM 03/30/2019   11:56 AM  GAD 7 : Generalized Anxiety Score  Nervous, Anxious, on Edge 2 2 2   Control/stop worrying 2 2 2   Worry too much - different things 2 2 2   Trouble relaxing 1 1 2   Restless 1 2 2   Easily annoyed or irritable 2 2 2   Afraid - awful might happen 1 1   Total GAD 7 Score 11 12   Anxiety Difficulty Very difficult Very difficult   Yesterday was the birthday of her late son, he would have been 65 years old Takes buspirone 15 mg twice daily, Xanax 0.25 mg twice daily as needed She is interested in referral to psychiatrist

## 2023-06-10 NOTE — Assessment & Plan Note (Signed)
    06/10/2023    8:11 AM 03/25/2023    8:33 AM 03/12/2022    8:49 AM 05/29/2021   11:44 AM 03/30/2019   10:52 AM  Depression screen PHQ 2/9  Decreased Interest 1 3 3   0  Down, Depressed, Hopeless 1 3 3 3  0  PHQ - 2 Score 2 6 6 3  0  Altered sleeping 1 3 3 3    Tired, decreased energy 1 1 1  0   Change in appetite 2 0 1 0   Feeling bad or failure about yourself  1 2 0 0   Trouble concentrating 1 2 3  0   Moving slowly or fidgety/restless 1 2 0 0   Suicidal thoughts 0 1 0 0   PHQ-9 Score 9 17 14 6    Difficult doing work/chores Somewhat difficult Somewhat difficult Somewhat difficult    Takes trazodone 50 mg daily at bedtime for insomnia Continue current medication Patient referred to psychiatrist

## 2023-06-10 NOTE — Assessment & Plan Note (Signed)
 Lab Results  Component Value Date   HGBA1C 5.5 07/20/2019   HGBA1C 5.5 07/20/2019   HGBA1C 5.5 (A) 07/20/2019   HGBA1C 5.5 07/20/2019  Rechecking A1c Patient counseled on low-carb diet

## 2023-06-11 ENCOUNTER — Telehealth: Payer: Self-pay

## 2023-06-11 LAB — HEMOGLOBIN A1C
Est. average glucose Bld gHb Est-mCnc: 117 mg/dL
Hgb A1c MFr Bld: 5.7 % — ABNORMAL HIGH (ref 4.8–5.6)

## 2023-06-11 LAB — LIPID PANEL
Chol/HDL Ratio: 3.5 ratio (ref 0.0–4.4)
Cholesterol, Total: 253 mg/dL — ABNORMAL HIGH (ref 100–199)
HDL: 73 mg/dL (ref >39)
LDL Chol Calc (NIH): 161 mg/dL — ABNORMAL HIGH (ref 0–99)
Triglycerides: 110 mg/dL (ref 0–149)
VLDL Cholesterol Cal: 19 mg/dL (ref 5–40)

## 2023-06-11 LAB — CBC
Hematocrit: 41.9 % (ref 34.0–46.6)
Hemoglobin: 13.7 g/dL (ref 11.1–15.9)
MCH: 28.7 pg (ref 26.6–33.0)
MCHC: 32.7 g/dL (ref 31.5–35.7)
MCV: 88 fL (ref 79–97)
Platelets: 291 10*3/uL (ref 150–450)
RBC: 4.77 x10E6/uL (ref 3.77–5.28)
RDW: 14.2 % (ref 11.7–15.4)
WBC: 4.6 10*3/uL (ref 3.4–10.8)

## 2023-06-11 NOTE — Telephone Encounter (Signed)
 Pharmacy Patient Advocate Encounter   Received notification from CoverMyMeds that prior authorization for [facility] is required/requested.   Insurance verification completed.   The patient is insured through [facility] MEDICARE PART D .   Per test claim: PA required; PA submitted to above mentioned insurance via CoverMyMeds Key/confirmation #/EOC [facility] Status is pending

## 2023-06-12 ENCOUNTER — Other Ambulatory Visit: Payer: Self-pay | Admitting: Nurse Practitioner

## 2023-06-12 ENCOUNTER — Telehealth: Payer: Self-pay

## 2023-06-12 ENCOUNTER — Other Ambulatory Visit: Payer: Self-pay

## 2023-06-12 DIAGNOSIS — G8929 Other chronic pain: Secondary | ICD-10-CM

## 2023-06-12 NOTE — Telephone Encounter (Signed)
 Created in error

## 2023-06-12 NOTE — Telephone Encounter (Signed)
 Pharmacy Patient Advocate Encounter  Received notification from  [facility] MEDICARE PART D  that Prior Authorization for [facility] has been DENIED.  Full denial letter will be uploaded to the media tab. See denial reason below.   PA #/Case ID/Reference #: [id number]  Decision Notes: Your requested medication is an anti-obesity medication that is excluded from Part D prescription coverage under Medicare rules, unless it is being used for a medically-accepted diagnosis approved by the Food and Drug Administration. Please refer to your Evidence of Coverage (EOC) section that references Part D drug coverage in your pharmacy plan documents for more information. Reviewed by: Maurice March *Please note: VHQION may be covered when used for a medically-accepted indication. The Food and Drug Administration has approved [phone] for the treatment to reduce the risk of major adverse cardiovascular events (cardiovascular death, non-fatal myocardial infarction, or non-fatal stroke). Any request outside this indication is an exclusion  **APPEAL SUBMITTED TO INSURANCE TODAY-PER INS TURN-AROUND TIME IS 7 BUSINESS DAYS.

## 2023-06-12 NOTE — Telephone Encounter (Signed)
 This came back error.  Spoke to pharmacy and they do not have a  script. Please advise [facility]

## 2023-06-16 ENCOUNTER — Other Ambulatory Visit: Payer: Self-pay

## 2023-07-07 ENCOUNTER — Encounter: Payer: Self-pay | Admitting: Emergency Medicine

## 2023-07-07 ENCOUNTER — Ambulatory Visit: Admission: EM | Admit: 2023-07-07 | Discharge: 2023-07-07 | Disposition: A | Discharge status: Home / Self Care

## 2023-07-07 DIAGNOSIS — Z113 Encounter for screening for infections with a predominantly sexual mode of transmission: Secondary | ICD-10-CM

## 2023-07-07 DIAGNOSIS — N898 Other specified noninflammatory disorders of vagina: Secondary | ICD-10-CM | POA: Diagnosis present

## 2023-07-07 DIAGNOSIS — Z202 Contact with and (suspected) exposure to infections with a predominantly sexual mode of transmission: Secondary | ICD-10-CM | POA: Diagnosis present

## 2023-07-07 MED ORDER — CLOTRIMAZOLE-BETAMETHASONE 1-0.05 % EX CREA
TOPICAL_CREAM | CUTANEOUS | 0 refills | Status: DC
Start: 2023-07-07 — End: 2023-09-23

## 2023-07-07 MED ORDER — VALACYCLOVIR HCL 1 G PO TABS: 1000.0000 mg | ORAL_TABLET | Freq: Two times a day (BID) | ORAL | 0 refills | Status: AC

## 2023-07-07 NOTE — ED Triage Notes (Signed)
 Pt c/o two bumps found in her vaginal area. Pt states, "it's inside the lips where the clit at and it hurts when I touch it."

## 2023-07-07 NOTE — ED Provider Notes (Signed)
 [name]    CSN: [id number] Arrival date & time: 07/07/23  1639      History   Chief Complaint Chief Complaint  Patient presents with   Exposure to STD    HPI Deborah Knight is a 58 y.o. female.    Exposure to STD  Presents today concern for possible STD.  Patient reports noticing a painful burning lesion on her labia reports subsequently noticing 2 additional painful blisters that have developed.  She endorses that she is in a relationship with the same partner that she has been involved with for the last 3 years.  She is never experienced any symptoms such as this.  Denies any other symptoms.  Past Medical History:  Diagnosis Date   Allergic rhinitis, seasonal    chronic   Anxiety    Arthritis    right foot, knees, hands, shoulder, back   Chronic low back pain    Depression    GERD (gastroesophageal reflux disease)    HLD (hyperlipidemia)    Hypertension    followed by pcp   (07-29-2019  pt stated never had a stress test)   Mild intermittent asthma    followed by pcp       Numbness of right hand    w/ pain and stiffness   Peripheral neuropathy    Pre-diabetes    followed by pcp   Sciatica, right side    Sleep apnea    Wears glasses     Patient Active Problem List   Diagnosis Date Noted   Annual physical exam 06/10/2023   Chronic pain of both shoulders 06/10/2023   Hypokalemia 04/16/2023   Sciatica of right side 03/25/2023   Screening breast examination 03/09/2019   Prediabetes 11/17/2013   Vitamin D  deficiency 09/23/2013   Groin strain 09/14/2013   Other malaise and fatigue 08/23/2013   Metabolic syndrome 08/23/2013   Back pain 08/23/2013   Angioedema 01/06/2013   Sprain and strain of shoulder and upper arm 10/09/2012   Bicipital tenosynovitis 09/04/2012   Joint derangement, shoulder region 07/10/2012   Closed dislocation of shoulder 07/10/2012   Dislocation of left shoulder joint 05/22/2012   Right facial swelling 04/07/2012    Rash 04/07/2012   Tobacco abuse 02/04/2012   KNEE PAIN, RIGHT 02/07/2010   GERD 10/10/2009   Depression 09/13/2009   Chronic seasonal allergic rhinitis 06/12/2009   Obesity 08/30/2008   LOW BACK PAIN SYNDROME 08/30/2008   Hyperlipidemia LDL goal <100 11/03/2006   Anxiety, generalized 11/03/2006   Essential hypertension 11/03/2006   Asthma 11/03/2006    Past Surgical History:  Procedure Laterality Date   ABDOMINAL HERNIA REPAIR  1989   CESAREAN SECTION W/BTL  1997   EXOSTECTECTOMY TOE Right 08/04/2019   Procedure: EXOSTECTECTOMY Right MIDFOOT;  Surgeon: Camilo Cella, DPM;  Location: [facility] ;  Service: Podiatry;  Laterality: Right;   SHOULDER ARTHROSCOPY WITH BICEPS TENDON REPAIR Left 09/25/2012   and Labrum repair    OB History     Gravida  4   Para      Term      Preterm      AB      Living  3      SAB      IAB      Ectopic      Multiple      Live Births               Home Medications    Prior  to Admission medications   Medication Sig Start Date End Date Taking? Authorizing Provider  clotrimazole-betamethasone  (LOTRISONE) cream Apply to affected area 2 times daily prn 07/07/23  Yes Buena Carmine, NP  HYDROcodone -acetaminophen  (NORCO) 7.5-325 MG tablet Take 1 tablet by mouth. 12/18/12  Yes [provider]  hydrOXYzine  (ATARAX ) 25 MG tablet Take 25 mg by mouth. 06/19/12  Yes [provider]  loratadine  (CLARITIN ) 10 MG tablet Take 10 mg by mouth. 06/19/12  Yes [provider]  sertraline  (ZOLOFT ) 50 MG tablet Take 50 mg by mouth. 06/19/12  Yes [provider]  valACYclovir (VALTREX) 1000 MG tablet Take 1 tablet (1,000 mg total) by mouth 2 (two) times daily for 14 days. 07/07/23 07/21/23 Yes Buena Carmine, NP  acetaminophen  (TYLENOL ) 500 MG tablet Take 500 mg by mouth every 6 (six) hours as needed.    [provider]  albuterol  (VENTOLIN  HFA) 108 (90 Base) MCG/ACT inhaler Inhale 2  puffs into the lungs every 6 (six) hours as needed for wheezing. Shortness of breath Patient taking differently: Inhale 2 puffs into the lungs every 6 (six) hours as needed for wheezing. Shortness of breath 10/14/18   Emmie Harness, FNP  ALPRAZolam  (XANAX ) 0.25 MG tablet Take 1 tablet (0.25 mg total) by mouth 2 (two) times daily as needed. 06/10/23   Paseda, Folashade R, FNP  amLODipine  (NORVASC ) 10 MG tablet Take 1 tablet (10 mg total) by mouth daily. 03/25/23   Paseda, Folashade R, FNP  busPIRone  (BUSPAR ) 15 MG tablet TAKE 1 TABLET BY MOUTH 2 TIMES A DAY 04/25/23   Paseda, Folashade R, FNP  cetirizine  (ZYRTEC ) 10 MG tablet Take 1 tablet (10 mg total) by mouth daily. 03/25/23   Paseda, Folashade R, FNP  cyclobenzaprine  (FLEXERIL ) 10 MG tablet Take 1 tablet (10 mg total) by mouth at bedtime as needed for muscle spasms. 04/16/23   Paseda, Folashade R, FNP  fluticasone  (FLONASE ) 50 MCG/ACT nasal spray Place 2 sprays into both nostrils daily as needed (congestion). 06/10/23   Paseda, Folashade R, FNP  fluticasone -salmeterol (ADVAIR HFA) 115-21 MCG/ACT inhaler Inhale 2 puffs into the lungs 2 (two) times daily. 03/25/23   Paseda, Folashade R, FNP  gabapentin  (NEURONTIN ) 300 MG capsule Take 1 capsule (300 mg total) by mouth at bedtime. 07/31/21   Sikora, Rebecca, DPM  ibuprofen  (ADVIL ) 600 MG tablet Take 1 tablet (600 mg total) by mouth every 8 (eight) hours as needed. 06/10/23   Paseda, Folashade R, FNP  montelukast  (SINGULAIR ) 10 MG tablet Take 1 tablet (10 mg total) by mouth at bedtime. 10/14/18   Emmie Harness, FNP  omeprazole  (PRILOSEC) 20 MG capsule Take 1 capsule (20 mg total) by mouth daily. 03/25/23   Paseda, Folashade R, FNP  pravastatin  (PRAVACHOL ) 40 MG tablet Take 40 mg by mouth daily.    [provider]  Semaglutide -Weight Management (WEGOVY ) 0.25 MG/0.5ML SOAJ Inject 0.25 mg into the skin once a week. 06/10/23   Paseda, Folashade R, FNP  spironolactone  (ALDACTONE ) 25 MG tablet Take 0.5 tablets (12.5  mg total) by mouth daily. 04/16/23   Paseda, Folashade R, FNP  traZODone  (DESYREL ) 50 MG tablet Take 0.5-1 tablets (25-50 mg total) by mouth at bedtime as needed for sleep. 03/12/22   Sigurd Driver, FNP    Family History Family History  Problem Relation Age of Onset   Hyperlipidemia Mother    Cancer Father    Asthma Brother     Social History Social History   Tobacco Use   Smoking  status: Former    Current packs/day: 0.25    Average packs/day: 0.3 packs/day for 1 year (0.3 ttl pk-yrs)    Types: Cigarettes   Smokeless tobacco: Never  Vaping Use   Vaping status: Never Used  Substance Use Topics   Alcohol use: No   Drug use: Not Currently    Types: Marijuana     Allergies   Losartan , Shellfish-derived products, Toradol  [ketorolac  tromethamine ], and Latex   Review of Systems Review of Systems Pertinent negatives listed in HPI   Physical Exam Triage Vital Signs ED Triage Vitals  Encounter Vitals Group     BP 07/07/23 1917 133/78     Systolic BP Percentile --      Diastolic BP Percentile --      Pulse Rate 07/07/23 1917 (!) 110     Resp 07/07/23 1917 18     Temp 07/07/23 1917 98.2 F (36.8 C)     Temp Source 07/07/23 1917 Oral     SpO2 07/07/23 1917 98 %     Weight 07/07/23 1916 207 lb 14.3 oz (94.3 kg)     Height --      Head Circumference --      Peak Flow --      Pain Score 07/07/23 1913 7     Pain Loc --      Pain Education --      Exclude from Growth Chart --    No data found.  Updated Vital Signs BP 133/78 (BP Location: Right Arm)   Pulse (!) 110   Temp 98.2 F (36.8 C) (Oral)   Resp 18   Wt 207 lb 14.3 oz (94.3 kg)   LMP 08/13/2012   SpO2 98%   BMI 38.02 kg/m   Visual Acuity Right Eye Distance:   Left Eye Distance:   Bilateral Distance:    Right Eye Near:   Left Eye Near:    Bilateral Near:     Physical Exam Vitals reviewed. Exam conducted with a chaperone present.  Constitutional:      Appearance: Normal appearance.  HENT:      Head: Normocephalic and atraumatic.     Nose: Nose normal.  Cardiovascular:     Rate and Rhythm: Regular rhythm. Tachycardia present.  Pulmonary:     Effort: Pulmonary effort is normal.     Breath sounds: Normal breath sounds.  Genitourinary:    Exam position: Prone.     Labia:        Right: Lesion present.        Left: Lesion present.   Skin:    General: Skin is warm and dry.  Neurological:     General: No focal deficit present.     Mental Status: She is alert and oriented to person, place, and time.      UC Treatments / Results  Labs (all labs ordered are listed, but only abnormal results are displayed) Labs Reviewed  HSV CULTURE AND TYPING  CERVICOVAGINAL ANCILLARY ONLY    EKG   Radiology No results found.  Procedures Procedures (including critical care time)  Medications Ordered in UC Medications - No data to display  Initial Impression / Assessment and Plan / UC Course  I have reviewed the triage vital signs and the nursing notes.  Pertinent labs & imaging results that were available during my care of the patient were reviewed by me and considered in my medical decision making (see chart for details). Vaginal lesion concerning for HSV, HSV culture pending.  Cervicoancillary cytology pending testing for trichomonas, chlamydia, gonorrhea, BV, candidiasis.  Treating suspected HSV infection with valacyclovir 1000 mg 3 times daily for 7 days may repeat if symptoms persist.  Topical Lotrisone cream up to 2 times daily as needed for vaginal lesions. Final Clinical Impressions(s) / UC Diagnoses   Final diagnoses:  Vaginal lesion  Screen for STD (sexually transmitted disease)     Discharge Instructions      Lab results take up to 3-5 days, however can result sooner.   Our office will only call if results are abnormal and require treatment. If you see a result and have questions feel free to reach out via phone and call this office directly and request to speak with  the nurse.  All results will be available to view on MyChart.   Valacyclovir 1000 mg twice daily for 7 days, if lesions remain present repeat an additional 7 days of medication.  For localized pain, apply Lotrisone cream to affected area (small thin layer) twice daily no more than 7 days.      ED Prescriptions     Medication Sig Dispense Auth. Provider   valACYclovir (VALTREX) 1000 MG tablet Take 1 tablet (1,000 mg total) by mouth 2 (two) times daily for 14 days. 28 tablet Buena Carmine, NP   clotrimazole-betamethasone  (LOTRISONE) cream Apply to affected area 2 times daily prn 45 g Buena Carmine, NP      PDMP not reviewed this encounter.   Buena Carmine, NP 07/08/23 1141

## 2023-07-07 NOTE — Discharge Instructions (Addendum)
 Lab results take up to 3-5 days, however can result sooner.   Our office will only call if results are abnormal and require treatment. If you see a result and have questions feel free to reach out via phone and call this office directly and request to speak with the nurse.  All results will be available to view on MyChart.   Valacyclovir 1000 mg twice daily for 7 days, if lesions remain present repeat an additional 7 days of medication.  For localized pain, apply Lotrisone cream to affected area (small thin layer) twice daily no more than 7 days.

## 2023-07-08 LAB — CERVICOVAGINAL ANCILLARY ONLY
Bacterial Vaginitis (gardnerella): POSITIVE — AB
Candida Glabrata: NEGATIVE
Candida Vaginitis: NEGATIVE
Chlamydia: NEGATIVE
Comment: NEGATIVE
Comment: NEGATIVE
Comment: NEGATIVE
Comment: NEGATIVE
Comment: NEGATIVE
Comment: NORMAL
Neisseria Gonorrhea: NEGATIVE
Trichomonas: NEGATIVE

## 2023-07-10 LAB — HSV CULTURE AND TYPING

## 2023-07-11 ENCOUNTER — Telehealth (HOSPITAL_COMMUNITY): Payer: Self-pay

## 2023-07-11 MED ORDER — METRONIDAZOLE 0.75 % VA GEL: 1.0000 | Freq: Every day | VAGINAL | 0 refills | Status: AC

## 2023-07-11 NOTE — Telephone Encounter (Signed)
 Per protocol, pt requires tx with metronidazole. Reviewed with patient, verified pharmacy, prescription sent.

## 2023-07-15 ENCOUNTER — Encounter: Payer: Self-pay | Admitting: Nurse Practitioner

## 2023-07-15 ENCOUNTER — Ambulatory Visit (INDEPENDENT_AMBULATORY_CARE_PROVIDER_SITE_OTHER): Payer: Self-pay | Admitting: Nurse Practitioner

## 2023-07-15 ENCOUNTER — Encounter (INDEPENDENT_AMBULATORY_CARE_PROVIDER_SITE_OTHER): Payer: Self-pay

## 2023-07-15 ENCOUNTER — Other Ambulatory Visit: Payer: Self-pay

## 2023-07-15 VITALS — BP 131/80 | HR 78 | Temp 97.0°F | Wt 204.0 lb

## 2023-07-15 DIAGNOSIS — I10 Essential (primary) hypertension: Secondary | ICD-10-CM | POA: Diagnosis not present

## 2023-07-15 DIAGNOSIS — M5431 Sciatica, right side: Secondary | ICD-10-CM

## 2023-07-15 DIAGNOSIS — E66812 Obesity, class 2: Secondary | ICD-10-CM | POA: Diagnosis not present

## 2023-07-15 DIAGNOSIS — F322 Major depressive disorder, single episode, severe without psychotic features: Secondary | ICD-10-CM

## 2023-07-15 DIAGNOSIS — E6609 Other obesity due to excess calories: Secondary | ICD-10-CM

## 2023-07-15 DIAGNOSIS — F411 Generalized anxiety disorder: Secondary | ICD-10-CM

## 2023-07-15 DIAGNOSIS — G8929 Other chronic pain: Secondary | ICD-10-CM | POA: Insufficient documentation

## 2023-07-15 DIAGNOSIS — M79674 Pain in right toe(s): Secondary | ICD-10-CM

## 2023-07-15 MED ORDER — SPIRONOLACTONE 25 MG PO TABS: 12.5000 mg | ORAL_TABLET | Freq: Every day | ORAL | 2 refills | Status: DC

## 2023-07-15 MED ORDER — AMLODIPINE BESYLATE 10 MG PO TABS: 10.0000 mg | ORAL_TABLET | Freq: Every day | ORAL | 2 refills | Status: DC

## 2023-07-15 NOTE — Assessment & Plan Note (Signed)
 Wears a boot as needed.  Stated that she has had surgery on this foot in the past She has a prescription for gabapentin  300 mg at bedtime, ibuprofen  600 mg every 8 hours as needed, continue current medications

## 2023-07-15 NOTE — Assessment & Plan Note (Signed)
 Encouraged to follow-up with orthopedics as planned Continue Flexeril  10 mg at bedtime as needed, ibuprofen  600 mg every 8 hours as needed

## 2023-07-15 NOTE — Assessment & Plan Note (Signed)
 She was referred to psychiatrist, she plans on calling them to schedule an appointment Continue buspirone  15 mg twice daily, Xanax  0.25 mg twice daily as needed    07/15/2023   10:53 AM 06/10/2023    9:11 AM 03/25/2023    8:37 AM 03/30/2019   11:56 AM  GAD 7 : Generalized Anxiety Score  Nervous, Anxious, on Edge 3 2 2 2   Control/stop worrying 3 2 2 2   Worry too much - different things 3 2 2 2   Trouble relaxing 1 1 1 2   Restless 0 1 2 2   Easily annoyed or irritable 1 2 2 2   Afraid - awful might happen 1 1 1    Total GAD 7 Score 12 11 12    Anxiety Difficulty Somewhat difficult Very difficult Very difficult

## 2023-07-15 NOTE — Patient Instructions (Addendum)
 [facility] [address] [place] [address]  P:  [phone] F:  [phone]    Medical weight management clinic [phone]    It is important that you exercise regularly at least 30 minutes 5 times a week as tolerated  Think about what you will eat, plan ahead. Choose " clean, green, fresh or frozen" over canned, processed or packaged foods which are more sugary, salty and fatty. 70 to 75% of food eaten should be vegetables and fruit. Three meals at set times with snacks allowed between meals, but they must be fruit or vegetables. Aim to eat over a 12 hour period , example 7 am to 7 pm, and STOP after  your last meal of the day. Drink water,generally about 64 ounces per day, no other drink is as healthy. Fruit juice is best enjoyed in a healthy way, by EATING the fruit.  Thanks for choosing [facility] we consider it a privelige to serve you.

## 2023-07-15 NOTE — Assessment & Plan Note (Signed)
 Controlled on amlodipine  5 mg daily and spironolactone  12.5 mg daily  DASH diet and commitment to daily physical activity for a minimum of 30 minutes discussed and encouraged, as a part of hypertension management. The importance of attaining a healthy weight is also discussed.     07/15/2023   10:55 AM 07/07/2023    7:17 PM 07/07/2023    7:16 PM 06/10/2023    8:05 AM 04/16/2023    9:16 AM 03/25/2023    8:47 AM 03/26/2022    9:09 AM  BP/Weight  Systolic BP 131 133  127 126 137 120  Diastolic BP 80 78  77 86 80 65  Wt. (Lbs) 204  207.89 208 202 206   BMI 37.31 kg/m2  38.02 kg/m2 38.04 kg/m2 36.95 kg/m2 37.68 kg/m2

## 2023-07-15 NOTE — Assessment & Plan Note (Signed)
    07/15/2023   10:46 AM 06/10/2023    8:11 AM 03/25/2023    8:33 AM 03/12/2022    8:49 AM 05/29/2021   11:44 AM  Depression screen PHQ 2/9  Decreased Interest 1 1 3 3    Down, Depressed, Hopeless 3 1 3 3 3   PHQ - 2 Score 4 2 6 6 3   Altered sleeping 1 1 3 3 3   Tired, decreased energy 3 1 1 1  0  Change in appetite 2 2 0 1 0  Feeling bad or failure about yourself  3 1 2  0 0  Trouble concentrating 3 1 2 3  0  Moving slowly or fidgety/restless 0 1 2 0 0  Suicidal thoughts 0 0 1 0 0  PHQ-9 Score 16 9 17 14 6   Difficult doing work/chores  Somewhat difficult Somewhat difficult Somewhat difficult   Patient encouraged to follow-up with psychiatrist Continue trazodone  50 mg at bedtime, Zoloft  50 mg daily

## 2023-07-15 NOTE — Progress Notes (Signed)
 Established Patient Office Visit  Subjective:  Patient ID: Deborah Knight, female    DOB: [birth year]  Age: 58 y.o. MRN: [id number]  CC:  Chief Complaint  Patient presents with   Weight Loss    HPI Deborah Knight is a 58 y.o. female  has a past medical history of Allergic rhinitis, seasonal, Anxiety, Arthritis, Chronic low back pain, Depression, GERD (gastroesophageal reflux disease), HLD (hyperlipidemia), Hypertension, Mild intermittent asthma, Numbness of right hand, Peripheral neuropathy, Pre-diabetes, Sciatica, right side, Sleep apnea, and Wears glasses.  Patient presents for follow-up for obesity  Obesity.  Wegovy  was prescribed but her insurance denied the coverage.  She has been doing sitdown exercises and walking exercises as tolerated, tries to eat well, avoiding eating after 7 PM.  We discussed referral to medical weight management since her insurance does not cover antiobesity medication     Sit down excercises as tolerated , trying to eat well. Some walking execrcises . Trying not to eat after 7   Boot on the left foot  Past Medical History:  Diagnosis Date   Allergic rhinitis, seasonal    chronic   Anxiety    Arthritis    right foot, knees, hands, shoulder, back   Chronic low back pain    Depression    GERD (gastroesophageal reflux disease)    HLD (hyperlipidemia)    Hypertension    followed by pcp   (07-29-2019  pt stated never had a stress test)   Mild intermittent asthma    followed by pcp       Numbness of right hand    w/ pain and stiffness   Peripheral neuropathy    Pre-diabetes    followed by pcp   Sciatica, right side    Sleep apnea    Wears glasses     Past Surgical History:  Procedure Laterality Date   ABDOMINAL HERNIA REPAIR  1989   CESAREAN SECTION W/BTL  1997   EXOSTECTECTOMY TOE Right 08/04/2019   Procedure: EXOSTECTECTOMY Right MIDFOOT;  Surgeon: Camilo Cella, DPM;  Location: [facility];  Service: Podiatry;   Laterality: Right;   SHOULDER ARTHROSCOPY WITH BICEPS TENDON REPAIR Left 09/25/2012   and Labrum repair    Family History  Problem Relation Age of Onset   Hyperlipidemia Mother    Cancer Father    Asthma Brother     Social History   Socioeconomic History   Marital status: Divorced    Spouse name: Not on file   Number of children: 3   Years of education: Not on file   Highest education level: Some college, no degree  Occupational History   Not on file  Tobacco Use   Smoking status: Former    Current packs/day: 0.25    Average packs/day: 0.3 packs/day for 1 year (0.3 ttl pk-yrs)    Types: Cigarettes   Smokeless tobacco: Never  Vaping Use   Vaping status: Never Used  Substance and Sexual Activity   Alcohol use: No   Drug use: Not Currently    Types: Marijuana   Sexual activity: Not on file  Other Topics Concern   Not on file  Social History Narrative   Employed as a Education officer, environmental.   Social Drivers of Corporate investment banker Strain: Not on file  Food Insecurity: Not on file  Transportation Needs: No Transportation Needs (03/09/2019)   PRAPARE - Administrator, Civil Service (Medical): No    Lack of Transportation (Non-Medical):  No  Physical Activity: Not on file  Stress: Not on file  Social Connections: Not on file  Intimate Partner Violence: Not on file    Outpatient Medications Prior to Visit  Medication Sig Dispense Refill   acetaminophen  (TYLENOL ) 500 MG tablet Take 500 mg by mouth every 6 (six) hours as needed.     albuterol  (VENTOLIN  HFA) 108 (90 Base) MCG/ACT inhaler Inhale 2 puffs into the lungs every 6 (six) hours as needed for wheezing. Shortness of breath (Patient taking differently: Inhale 2 puffs into the lungs every 6 (six) hours as needed for wheezing. Shortness of breath) 6.7 g 5   ALPRAZolam  (XANAX ) 0.25 MG tablet Take 1 tablet (0.25 mg total) by mouth 2 (two) times daily as needed. 30 tablet 0   busPIRone  (BUSPAR ) 15 MG tablet  TAKE 1 TABLET BY MOUTH 2 TIMES A DAY 90 tablet 1   cetirizine  (ZYRTEC ) 10 MG tablet Take 1 tablet (10 mg total) by mouth daily. 30 tablet 11   clotrimazole -betamethasone  (LOTRISONE ) cream Apply to affected area 2 times daily prn 45 g 0   cyclobenzaprine  (FLEXERIL ) 10 MG tablet Take 1 tablet (10 mg total) by mouth at bedtime as needed for muscle spasms. 20 tablet 0   fluticasone  (FLONASE ) 50 MCG/ACT nasal spray Place 2 sprays into both nostrils daily as needed (congestion). 16 g 3   fluticasone -salmeterol (ADVAIR HFA) 115-21 MCG/ACT inhaler Inhale 2 puffs into the lungs 2 (two) times daily. 1 each 11   gabapentin  (NEURONTIN ) 300 MG capsule Take 1 capsule (300 mg total) by mouth at bedtime. 90 capsule 3   HYDROcodone -acetaminophen  (NORCO) 7.5-325 MG tablet Take 1 tablet by mouth.     hydrOXYzine  (ATARAX ) 25 MG tablet Take 25 mg by mouth.     ibuprofen  (ADVIL ) 600 MG tablet Take 1 tablet (600 mg total) by mouth every 8 (eight) hours as needed. 30 tablet 1   loratadine  (CLARITIN ) 10 MG tablet Take 10 mg by mouth.     metroNIDAZOLE  (METROGEL ) 0.75 % vaginal gel Place 1 Applicatorful vaginally at bedtime for 5 days. 50 g 0   montelukast  (SINGULAIR ) 10 MG tablet Take 1 tablet (10 mg total) by mouth at bedtime. 90 tablet 3   omeprazole  (PRILOSEC) 20 MG capsule Take 1 capsule (20 mg total) by mouth daily. 90 capsule 3   pravastatin  (PRAVACHOL ) 40 MG tablet Take 40 mg by mouth daily.     sertraline  (ZOLOFT ) 50 MG tablet Take 50 mg by mouth.     traZODone  (DESYREL ) 50 MG tablet Take 0.5-1 tablets (25-50 mg total) by mouth at bedtime as needed for sleep. 30 tablet 3   valACYclovir  (VALTREX ) 1000 MG tablet Take 1 tablet (1,000 mg total) by mouth 2 (two) times daily for 14 days. 28 tablet 0   amLODipine  (NORVASC ) 10 MG tablet Take 1 tablet (10 mg total) by mouth daily. 90 tablet 1   spironolactone  (ALDACTONE ) 25 MG tablet Take 0.5 tablets (12.5 mg total) by mouth daily. 90 tablet 1   Semaglutide -Weight  Management (WEGOVY ) 0.25 MG/0.5ML SOAJ Inject 0.25 mg into the skin once a week. (Patient not taking: Reported on 07/15/2023) 2 mL 0   No facility-administered medications prior to visit.    Allergies  Allergen Reactions   Losartan  Swelling    Lip swelling   Shellfish-Derived Products     Other reaction(s): Angioedema (ALLERGY/intolerance)  Shrimps   Toradol  [Ketorolac  Tromethamine ] Swelling    Lip swelling   Latex Rash    ROS Review  of Systems  Constitutional:  Negative for appetite change, chills, fatigue and fever.  HENT:  Negative for congestion, postnasal drip, rhinorrhea and sneezing.   Respiratory:  Negative for cough, shortness of breath and wheezing.   Cardiovascular:  Negative for chest pain, palpitations and leg swelling.  Gastrointestinal:  Negative for abdominal pain, constipation, nausea and vomiting.  Genitourinary:  Negative for difficulty urinating, dysuria, flank pain and frequency.  Musculoskeletal:  Positive for arthralgias and back pain. Negative for joint swelling and myalgias.  Skin:  Negative for color change, pallor, rash and wound.  Neurological:  Negative for dizziness, facial asymmetry, weakness, numbness and headaches.  Psychiatric/Behavioral:  Negative for behavioral problems, confusion, self-injury and suicidal ideas.       Objective:    Physical Exam Vitals and nursing note reviewed.  Constitutional:      General: She is not in acute distress.    Appearance: Normal appearance. She is obese. She is not ill-appearing, toxic-appearing or diaphoretic.  Eyes:     General: No scleral icterus.       Right eye: No discharge.        Left eye: No discharge.     Extraocular Movements: Extraocular movements intact.     Conjunctiva/sclera: Conjunctivae normal.  Cardiovascular:     Rate and Rhythm: Normal rate and regular rhythm.     Pulses: Normal pulses.     Heart sounds: Normal heart sounds. No murmur heard.    No friction rub. No gallop.   Pulmonary:     Effort: Pulmonary effort is normal. No respiratory distress.     Breath sounds: Normal breath sounds. No stridor. No wheezing, rhonchi or rales.  Chest:     Chest wall: No tenderness.  Abdominal:     General: There is no distension.     Palpations: Abdomen is soft.     Tenderness: There is no abdominal tenderness. There is no right CVA tenderness, left CVA tenderness or guarding.  Musculoskeletal:        General: No swelling, deformity or signs of injury.     Right lower leg: No edema.     Left lower leg: No edema.     Comments: Has a boot on the left foot, walking independently without use of a cane today  Skin:    General: Skin is warm and dry.     Capillary Refill: Capillary refill takes less than 2 seconds.     Coloration: Skin is not jaundiced or pale.     Findings: No bruising, erythema or lesion.  Neurological:     Mental Status: She is alert and oriented to person, place, and time.     Motor: No weakness.     Coordination: Coordination normal.     Gait: Gait normal.  Psychiatric:        Mood and Affect: Mood normal.        Behavior: Behavior normal.        Thought Content: Thought content normal.        Judgment: Judgment normal.     BP 131/80   Pulse 78   Temp (!) 97 F (36.1 C)   Wt 204 lb (92.5 kg)   LMP 08/13/2012   SpO2 (!) 2%   BMI 37.31 kg/m  Wt Readings from Last 3 Encounters:  07/15/23 204 lb (92.5 kg)  07/07/23 207 lb 14.3 oz (94.3 kg)  06/10/23 208 lb (94.3 kg)    Lab Results  Component Value Date   TSH  0.627 05/30/2017   Lab Results  Component Value Date   WBC 4.6 06/10/2023   HGB 13.7 06/10/2023   HCT 41.9 06/10/2023   MCV 88 06/10/2023   PLT 291 06/10/2023   Lab Results  Component Value Date   NA 143 04/29/2023   K 4.1 04/29/2023   CO2 23 04/29/2023   GLUCOSE 83 04/29/2023   BUN 11 04/29/2023   CREATININE 0.52 (L) 04/29/2023   BILITOT 0.2 03/25/2023   ALKPHOS 90 03/25/2023   AST 15 03/25/2023   ALT 11  03/25/2023   PROT 8.0 03/25/2023   ALBUMIN 4.5 03/25/2023   CALCIUM 9.3 04/29/2023   ANIONGAP 9 05/25/2017   EGFR 108 04/29/2023   Lab Results  Component Value Date   CHOL 253 (H) 06/10/2023   Lab Results  Component Value Date   HDL 73 06/10/2023   Lab Results  Component Value Date   LDLCALC 161 (H) 06/10/2023   Lab Results  Component Value Date   TRIG 110 06/10/2023   Lab Results  Component Value Date   CHOLHDL 3.5 06/10/2023   Lab Results  Component Value Date   HGBA1C 5.7 (H) 06/10/2023      Assessment & Plan:   Problem List Items Addressed This Visit       Cardiovascular and Mediastinum   Essential hypertension   Controlled on amlodipine  5 mg daily and spironolactone  12.5 mg daily  DASH diet and commitment to daily physical activity for a minimum of 30 minutes discussed and encouraged, as a part of hypertension management. The importance of attaining a healthy weight is also discussed.     07/15/2023   10:55 AM 07/07/2023    7:17 PM 07/07/2023    7:16 PM 06/10/2023    8:05 AM 04/16/2023    9:16 AM 03/25/2023    8:47 AM 03/26/2022    9:09 AM  BP/Weight  Systolic BP 131 133  127 126 137 120  Diastolic BP 80 78  77 86 80 65  Wt. (Lbs) 204  207.89 208 202 206   BMI 37.31 kg/m2  38.02 kg/m2 38.04 kg/m2 36.95 kg/m2 37.68 kg/m2            Relevant Medications   amLODipine  (NORVASC ) 10 MG tablet   spironolactone  (ALDACTONE ) 25 MG tablet     Nervous and Auditory   Sciatica of right side   Encouraged to follow-up with orthopedics as planned Continue Flexeril  10 mg at bedtime as needed, ibuprofen  600 mg every 8 hours as needed         Other   Obesity - Primary   Wt Readings from Last 3 Encounters:  07/15/23 204 lb (92.5 kg)  07/07/23 207 lb 14.3 oz (94.3 kg)  06/10/23 208 lb (94.3 kg)   Body mass index is 37.31 kg/m.  Patient has lost 4 pounds since her last visit She was congratulated on her efforts at losing weight Patient counseled on low-carb  diet Encouraged to engage in regular moderate exercises as tolerated Since insurance does not cover antiobesity medication, we discussed referral to medical weight management for nutrition education, referral placed      Relevant Orders   Amb Ref to Medical Weight Management   Anxiety, generalized   She was referred to psychiatrist, she plans on calling them to schedule an appointment Continue buspirone  15 mg twice daily, Xanax  0.25 mg twice daily as needed    07/15/2023   10:53 AM 06/10/2023    9:11 AM 03/25/2023  8:37 AM 03/30/2019   11:56 AM  GAD 7 : Generalized Anxiety Score  Nervous, Anxious, on Edge 3 2 2 2   Control/stop worrying 3 2 2 2   Worry too much - different things 3 2 2 2   Trouble relaxing 1 1 1 2   Restless 0 1 2 2   Easily annoyed or irritable 1 2 2 2   Afraid - awful might happen 1 1 1    Total GAD 7 Score 12 11 12    Anxiety Difficulty Somewhat difficult Very difficult Very difficult           Depression      07/15/2023   10:46 AM 06/10/2023    8:11 AM 03/25/2023    8:33 AM 03/12/2022    8:49 AM 05/29/2021   11:44 AM  Depression screen PHQ 2/9  Decreased Interest 1 1 3 3    Down, Depressed, Hopeless 3 1 3 3 3   PHQ - 2 Score 4 2 6 6 3   Altered sleeping 1 1 3 3 3   Tired, decreased energy 3 1 1 1  0  Change in appetite 2 2 0 1 0  Feeling bad or failure about yourself  3 1 2  0 0  Trouble concentrating 3 1 2 3  0  Moving slowly or fidgety/restless 0 1 2 0 0  Suicidal thoughts 0 0 1 0 0  PHQ-9 Score 16 9 17 14 6   Difficult doing work/chores  Somewhat difficult Somewhat difficult Somewhat difficult   Patient encouraged to follow-up with psychiatrist Continue trazodone  50 mg at bedtime, Zoloft  50 mg daily      Chronic pain of toe of right foot   Wears a boot as needed.  Stated that she has had surgery on this foot in the past She has a prescription for gabapentin  300 mg at bedtime, ibuprofen  600 mg every 8 hours as needed, continue current medications       Meds  ordered this encounter  Medications   amLODipine  (NORVASC ) 10 MG tablet    Sig: Take 1 tablet (10 mg total) by mouth daily.    Dispense:  90 tablet    Refill:  2   spironolactone  (ALDACTONE ) 25 MG tablet    Sig: Take 0.5 tablets (12.5 mg total) by mouth daily.    Dispense:  90 tablet    Refill:  2    Follow-up: No follow-ups on file.    Tyon Cerasoli R Jhade Berko, FNP

## 2023-07-15 NOTE — Assessment & Plan Note (Addendum)
 Wt Readings from Last 3 Encounters:  07/15/23 204 lb (92.5 kg)  07/07/23 207 lb 14.3 oz (94.3 kg)  06/10/23 208 lb (94.3 kg)   Body mass index is 37.31 kg/m.  Patient has lost 4 pounds since her last visit She was congratulated on her efforts at losing weight Patient counseled on low-carb diet Encouraged to engage in regular moderate exercises as tolerated Since insurance does not cover antiobesity medication, we discussed referral to medical weight management for nutrition education, referral placed

## 2023-07-16 ENCOUNTER — Other Ambulatory Visit: Payer: Self-pay

## 2023-09-23 ENCOUNTER — Ambulatory Visit (INDEPENDENT_AMBULATORY_CARE_PROVIDER_SITE_OTHER): Payer: Self-pay | Admitting: Nurse Practitioner

## 2023-09-23 ENCOUNTER — Encounter: Payer: Self-pay | Admitting: Nurse Practitioner

## 2023-09-23 ENCOUNTER — Other Ambulatory Visit: Payer: Self-pay | Admitting: Nurse Practitioner

## 2023-09-23 VITALS — BP 132/88 | HR 70 | Temp 97.3°F | Wt 203.0 lb

## 2023-09-23 DIAGNOSIS — B009 Herpesviral infection, unspecified: Secondary | ICD-10-CM | POA: Diagnosis not present

## 2023-09-23 DIAGNOSIS — E785 Hyperlipidemia, unspecified: Secondary | ICD-10-CM

## 2023-09-23 DIAGNOSIS — N898 Other specified noninflammatory disorders of vagina: Secondary | ICD-10-CM | POA: Insufficient documentation

## 2023-09-23 DIAGNOSIS — R21 Rash and other nonspecific skin eruption: Secondary | ICD-10-CM | POA: Diagnosis not present

## 2023-09-23 DIAGNOSIS — F411 Generalized anxiety disorder: Secondary | ICD-10-CM

## 2023-09-23 DIAGNOSIS — I10 Essential (primary) hypertension: Secondary | ICD-10-CM

## 2023-09-23 DIAGNOSIS — E66812 Obesity, class 2: Secondary | ICD-10-CM

## 2023-09-23 DIAGNOSIS — Z6835 Body mass index (BMI) 35.0-35.9, adult: Secondary | ICD-10-CM

## 2023-09-23 MED ORDER — VALACYCLOVIR HCL 1 G PO TABS: 1000.0000 mg | ORAL_TABLET | Freq: Every day | ORAL | 2 refills | Status: DC

## 2023-09-23 MED ORDER — CLOTRIMAZOLE-BETAMETHASONE 1-0.05 % EX CREA: TOPICAL_CREAM | CUTANEOUS | 0 refills | Status: DC

## 2023-09-23 MED ORDER — SPIRONOLACTONE 25 MG PO TABS: 12.5000 mg | ORAL_TABLET | Freq: Every day | ORAL | 2 refills | Status: DC

## 2023-09-23 NOTE — Assessment & Plan Note (Signed)
 Controlled on amlodipine  10 mg daily, spironolactone  12.5 mg daily Denies chest pain shortness of breath edema DASH diet and commitment to daily physical activity for a minimum of 30 minutes discussed and encouraged, as a part of hypertension management. The importance of attaining a healthy weight is also discussed.     09/23/2023    8:52 AM 09/23/2023    8:34 AM 07/15/2023   10:55 AM 07/07/2023    7:17 PM 07/07/2023    7:16 PM 06/10/2023    8:05 AM 04/16/2023    9:16 AM  BP/Weight  Systolic BP 132 144 131 133  127 126  Diastolic BP 88 96 80 78  77 86  Wt. (Lbs)  203 204  207.89 208 202  BMI  37.13 kg/m2 37.31 kg/m2  38.02 kg/m2 38.04 kg/m2 36.95 kg/m2

## 2023-09-23 NOTE — Assessment & Plan Note (Signed)
 Lab Results  Component Value Date   CHOL 253 (H) 06/10/2023   HDL 73 06/10/2023   LDLCALC 161 (H) 06/10/2023   LDLDIRECT 222 (H) 04/09/2012   TRIG 110 06/10/2023   CHOLHDL 3.5 06/10/2023  Still not taking statin We will recheck lipid panel at next visit Avoid fatty fried foods lose weight

## 2023-09-23 NOTE — Assessment & Plan Note (Signed)
 Valtrex  1000 mg daily for 5 days ordered Continue Lotrisone  cream twice daily

## 2023-09-23 NOTE — Assessment & Plan Note (Signed)
 Takes Xanax  as needed Has a prescription for buspirone  that she is not taking, was on zoloft  in the past  She was referred to psychiatrist but stated that she was not ready to talk with anyone about her mental health when they had called her to make an appointment. She was very tearful in the office today because she was diagnosed with HSV She currently denies SI, HI     09/23/2023    9:47 AM 07/15/2023   10:53 AM 06/10/2023    9:11 AM 03/25/2023    8:37 AM  GAD 7 : Generalized Anxiety Score  Nervous, Anxious, on Edge 2 3 2 2   Control/stop worrying 3 3 2 2   Worry too much - different things 3 3 2 2   Trouble relaxing 2 1 1 1   Restless 2 0 1 2  Easily annoyed or irritable 2 1 2 2   Afraid - awful might happen 3 1 1 1   Total GAD 7 Score 17 12 11 12   Anxiety Difficulty Somewhat difficult Somewhat difficult Very difficult Very difficult

## 2023-09-23 NOTE — Assessment & Plan Note (Signed)
 Patient complains of genital rashes that started some days ago, rashes are irritated similar to the rashes she had when she was diagnosed with HSV.  - valACYclovir  (VALTREX ) 1000 MG tablet; Take 1 tablet (1,000 mg total) by mouth daily for 5 days.  Refills provided to be taken within 1 day of lesion onset

## 2023-09-23 NOTE — Patient Instructions (Addendum)
 [facility] [address] [place] [address]  P:  [phone]  Please call the psychiatrist office on [phone] to schedule an appointment   1. HSV infection (Primary)  - valACYclovir  (VALTREX ) 1000 MG tablet; Take 1 tablet (1,000 mg total) by mouth daily for 5 days   At the onset of infection or  within 1 day of lesion onset please repeat treatment by taking valtrex  1000 mg daily for 5 days   Rash of genitalia  - clotrimazole -betamethasone  (LOTRISONE ) cream; Apply to affected area 2 times daily prn  Dispense: 45 g; Refill: 0      It is important that you exercise regularly at least 30 minutes 5 times a week as tolerated  Think about what you will eat, plan ahead. Choose  clean, green, fresh or frozen over canned, processed or packaged foods which are more sugary, salty and fatty. 70 to 75% of food eaten should be vegetables and fruit. Three meals at set times with snacks allowed between meals, but they must be fruit or vegetables. Aim to eat over a 12 hour period , example 7 am to 7 pm, and STOP after  your last meal of the day. Drink water,generally about 64 ounces per day, no other drink is as healthy. Fruit juice is best enjoyed in a healthy way, by EATING the fruit.  Thanks for choosing [facility] we consider it a privelige to serve you.

## 2023-09-23 NOTE — Assessment & Plan Note (Signed)
 Insurance did not cover a GLP-1 She has been trying to eat right and does chair exercises Patient counseled on low-carb diet Encouraged engage in regular moderate exercises at least 150 minutes weekly as tolerated Wt Readings from Last 3 Encounters:  09/23/23 203 lb (92.1 kg)  07/15/23 204 lb (92.5 kg)  07/07/23 207 lb 14.3 oz (94.3 kg)   Body mass index is 37.13 kg/m.

## 2023-09-23 NOTE — Progress Notes (Signed)
 Established Patient Office Visit  Subjective:  Patient ID: Deborah Knight, female    DOB: 1965/11/29  Age: 58 y.o. MRN: [id number]  CC:  Chief Complaint  Patient presents with   Hyperlipidemia   Hypertension   Anxiety    HPI Deborah Knight is a 58 y.o. female  has a past medical history of Allergic rhinitis, seasonal, Anxiety, Arthritis, Chronic low back pain, Depression, GERD (gastroesophageal reflux disease), HLD (hyperlipidemia), Hypertension, Mild intermittent asthma, Numbness of right hand, Peripheral neuropathy, Pre-diabetes, Sciatica, right side, Sleep apnea, and Wears glasses.   Patient presents for follow-up for her chronic medical conditions Please see assessment and plan section for full HPI and plan    Past Medical History:  Diagnosis Date   Allergic rhinitis, seasonal    chronic   Anxiety    Arthritis    right foot, knees, hands, shoulder, back   Chronic low back pain    Depression    GERD (gastroesophageal reflux disease)    HLD (hyperlipidemia)    Hypertension    followed by pcp   (07-29-2019  pt stated never had a stress test)   Mild intermittent asthma    followed by pcp       Numbness of right hand    w/ pain and stiffness   Peripheral neuropathy    Pre-diabetes    followed by pcp   Sciatica, right side    Sleep apnea    Wears glasses     Past Surgical History:  Procedure Laterality Date   ABDOMINAL HERNIA REPAIR  1989   CESAREAN SECTION W/BTL  1997   EXOSTECTECTOMY TOE Right 08/04/2019   Procedure: EXOSTECTECTOMY Right MIDFOOT;  Surgeon: Gretel Ozell PARAS, DPM;  Location: [facility];  Service: Podiatry;  Laterality: Right;   SHOULDER ARTHROSCOPY WITH BICEPS TENDON REPAIR Left 09/25/2012   and Labrum repair    Family History  Problem Relation Age of Onset   Hyperlipidemia Mother    Cancer Father    Asthma Brother     Social History   Socioeconomic History   Marital status: Divorced    Spouse name: Not on file    Number of children: 3   Years of education: Not on file   Highest education level: Some college, no degree  Occupational History   Not on file  Tobacco Use   Smoking status: Former    Current packs/day: 0.25    Average packs/day: 0.3 packs/day for 1 year (0.3 ttl pk-yrs)    Types: Cigarettes   Smokeless tobacco: Never  Vaping Use   Vaping status: Never Used  Substance and Sexual Activity   Alcohol use: No   Drug use: Not Currently    Types: Marijuana   Sexual activity: Not on file  Other Topics Concern   Not on file  Social History Narrative   Employed as a Education officer, environmental.   Social Drivers of Corporate investment banker Strain: Not on file  Food Insecurity: Not on file  Transportation Needs: No Transportation Needs (03/09/2019)   PRAPARE - Administrator, Civil Service (Medical): No    Lack of Transportation (Non-Medical): No  Physical Activity: Not on file  Stress: Not on file  Social Connections: Not on file  Intimate Partner Violence: Not on file    Outpatient Medications Prior to Visit  Medication Sig Dispense Refill   acetaminophen  (TYLENOL ) 500 MG tablet Take 500 mg by mouth every 6 (six) hours as needed.  albuterol  (VENTOLIN  HFA) 108 (90 Base) MCG/ACT inhaler Inhale 2 puffs into the lungs every 6 (six) hours as needed for wheezing. Shortness of breath 6.7 g 5   amLODipine  (NORVASC ) 10 MG tablet Take 1 tablet (10 mg total) by mouth daily. 90 tablet 2   busPIRone  (BUSPAR ) 15 MG tablet TAKE 1 TABLET BY MOUTH 2 TIMES A DAY 90 tablet 1   cyclobenzaprine  (FLEXERIL ) 10 MG tablet Take 1 tablet (10 mg total) by mouth at bedtime as needed for muscle spasms. 20 tablet 0   fluticasone  (FLONASE ) 50 MCG/ACT nasal spray Place 2 sprays into both nostrils daily as needed (congestion). 16 g 3   fluticasone -salmeterol (ADVAIR HFA) 115-21 MCG/ACT inhaler Inhale 2 puffs into the lungs 2 (two) times daily. 1 each 11   gabapentin  (NEURONTIN ) 300 MG capsule Take 1  capsule (300 mg total) by mouth at bedtime. 90 capsule 3   hydrOXYzine  (ATARAX ) 25 MG tablet Take 25 mg by mouth.     ibuprofen  (ADVIL ) 600 MG tablet Take 1 tablet (600 mg total) by mouth every 8 (eight) hours as needed. 30 tablet 1   loratadine  (CLARITIN ) 10 MG tablet Take 10 mg by mouth.     montelukast  (SINGULAIR ) 10 MG tablet Take 1 tablet (10 mg total) by mouth at bedtime. 90 tablet 3   omeprazole  (PRILOSEC) 20 MG capsule Take 1 capsule (20 mg total) by mouth daily. 90 capsule 3   traZODone  (DESYREL ) 50 MG tablet Take 0.5-1 tablets (25-50 mg total) by mouth at bedtime as needed for sleep. 30 tablet 3   ALPRAZolam  (XANAX ) 0.25 MG tablet Take 1 tablet (0.25 mg total) by mouth 2 (two) times daily as needed. 30 tablet 0   clotrimazole -betamethasone  (LOTRISONE ) cream Apply to affected area 2 times daily prn 45 g 0   spironolactone  (ALDACTONE ) 25 MG tablet Take 0.5 tablets (12.5 mg total) by mouth daily. 90 tablet 2   cetirizine  (ZYRTEC ) 10 MG tablet Take 1 tablet (10 mg total) by mouth daily. 30 tablet 11   pravastatin  (PRAVACHOL ) 40 MG tablet Take 40 mg by mouth daily. (Patient not taking: Reported on 09/23/2023)     sertraline  (ZOLOFT ) 50 MG tablet Take 50 mg by mouth. (Patient not taking: Reported on 09/23/2023)     HYDROcodone -acetaminophen  (NORCO) 7.5-325 MG tablet Take 1 tablet by mouth. (Patient not taking: Reported on 09/23/2023)     Semaglutide -Weight Management (WEGOVY ) 0.25 MG/0.5ML SOAJ Inject 0.25 mg into the skin once a week. (Patient not taking: Reported on 09/23/2023) 2 mL 0   No facility-administered medications prior to visit.    Allergies  Allergen Reactions   Losartan  Swelling    Lip swelling   Shellfish-Derived Products     Other reaction(s): Angioedema (ALLERGY/intolerance)  Shrimps   Toradol  [Ketorolac  Tromethamine ] Swelling    Lip swelling   Latex Rash    ROS Review of Systems  Constitutional:  Negative for appetite change, chills, fatigue and fever.  HENT:   Negative for congestion, postnasal drip, rhinorrhea and sneezing.   Respiratory:  Negative for cough, shortness of breath and wheezing.   Cardiovascular:  Negative for chest pain, palpitations and leg swelling.  Gastrointestinal:  Negative for abdominal pain, constipation, nausea and vomiting.  Genitourinary:  Negative for difficulty urinating, dysuria, flank pain and frequency.  Musculoskeletal:  Negative for joint swelling, myalgias and neck pain.  Skin:  Positive for rash. Negative for color change, pallor and wound.  Neurological:  Negative for dizziness, facial asymmetry, weakness, numbness and headaches.  Psychiatric/Behavioral:  Negative for behavioral problems, confusion, self-injury and suicidal ideas.       Objective:    Physical Exam Vitals and nursing note reviewed. Exam conducted with a chaperone present.  Constitutional:      General: She is not in acute distress.    Appearance: Normal appearance. She is obese. She is not ill-appearing, toxic-appearing or diaphoretic.  Eyes:     General: No scleral icterus.       Right eye: No discharge.        Left eye: No discharge.     Extraocular Movements: Extraocular movements intact.     Conjunctiva/sclera: Conjunctivae normal.  Cardiovascular:     Rate and Rhythm: Normal rate and regular rhythm.     Pulses: Normal pulses.     Heart sounds: Normal heart sounds. No murmur heard.    No friction rub. No gallop.  Pulmonary:     Effort: Pulmonary effort is normal. No respiratory distress.     Breath sounds: Normal breath sounds. No stridor. No wheezing, rhonchi or rales.  Chest:     Chest wall: No tenderness.  Abdominal:     General: There is no distension.     Palpations: Abdomen is soft.     Tenderness: There is no abdominal tenderness. There is no right CVA tenderness, left CVA tenderness or guarding.  Genitourinary:    Pubic Area: Rash present.     Comments: Erythematous rashes noted in bilateral groin area.  No drainage  or discharge noted Musculoskeletal:        General: No swelling or signs of injury.     Right lower leg: No edema.     Left lower leg: No edema.  Skin:    General: Skin is warm and dry.     Capillary Refill: Capillary refill takes less than 2 seconds.     Coloration: Skin is not jaundiced or pale.     Findings: No bruising, erythema or lesion.  Neurological:     Mental Status: She is alert and oriented to person, place, and time.     Motor: No weakness.     Coordination: Coordination normal.     Gait: Gait abnormal.     Comments: Uses a cane for ambulation  Psychiatric:        Mood and Affect: Mood normal.        Behavior: Behavior normal.        Thought Content: Thought content normal.        Judgment: Judgment normal.     BP 132/88   Pulse 70   Temp (!) 97.3 F (36.3 C)   Wt 203 lb (92.1 kg)   LMP 08/13/2012   SpO2 100%   BMI 37.13 kg/m  Wt Readings from Last 3 Encounters:  09/23/23 203 lb (92.1 kg)  07/15/23 204 lb (92.5 kg)  07/07/23 207 lb 14.3 oz (94.3 kg)    Lab Results  Component Value Date   TSH 0.627 05/30/2017   Lab Results  Component Value Date   WBC 4.6 06/10/2023   HGB 13.7 06/10/2023   HCT 41.9 06/10/2023   MCV 88 06/10/2023   PLT 291 06/10/2023   Lab Results  Component Value Date   NA 143 04/29/2023   K 4.1 04/29/2023   CO2 23 04/29/2023   GLUCOSE 83 04/29/2023   BUN 11 04/29/2023   CREATININE 0.52 (L) 04/29/2023   BILITOT 0.2 03/25/2023   ALKPHOS 90 03/25/2023   AST 15 03/25/2023  ALT 11 03/25/2023   PROT 8.0 03/25/2023   ALBUMIN 4.5 03/25/2023   CALCIUM 9.3 04/29/2023   ANIONGAP 9 05/25/2017   EGFR 108 04/29/2023   Lab Results  Component Value Date   CHOL 253 (H) 06/10/2023   Lab Results  Component Value Date   HDL 73 06/10/2023   Lab Results  Component Value Date   LDLCALC 161 (H) 06/10/2023   Lab Results  Component Value Date   TRIG 110 06/10/2023   Lab Results  Component Value Date   CHOLHDL 3.5 06/10/2023    Lab Results  Component Value Date   HGBA1C 5.7 (H) 06/10/2023      Assessment & Plan:   Problem List Items Addressed This Visit       Cardiovascular and Mediastinum   Essential hypertension   Controlled on amlodipine  10 mg daily, spironolactone  12.5 mg daily Denies chest pain shortness of breath edema DASH diet and commitment to daily physical activity for a minimum of 30 minutes discussed and encouraged, as a part of hypertension management. The importance of attaining a healthy weight is also discussed.     09/23/2023    8:52 AM 09/23/2023    8:34 AM 07/15/2023   10:55 AM 07/07/2023    7:17 PM 07/07/2023    7:16 PM 06/10/2023    8:05 AM 04/16/2023    9:16 AM  BP/Weight  Systolic BP 132 144 131 133  127 126  Diastolic BP 88 96 80 78  77 86  Wt. (Lbs)  203 204  207.89 208 202  BMI  37.13 kg/m2 37.31 kg/m2  38.02 kg/m2 38.04 kg/m2 36.95 kg/m2           Relevant Medications   spironolactone  (ALDACTONE ) 25 MG tablet   Other Relevant Orders   Basic Metabolic Panel     Musculoskeletal and Integument   Rash of genitalia   Valtrex  1000 mg daily for 5 days ordered Continue Lotrisone  cream twice daily      Relevant Medications   clotrimazole -betamethasone  (LOTRISONE ) cream     Other   Hyperlipidemia LDL goal <100   Lab Results  Component Value Date   CHOL 253 (H) 06/10/2023   HDL 73 06/10/2023   LDLCALC 161 (H) 06/10/2023   LDLDIRECT 222 (H) 04/09/2012   TRIG 110 06/10/2023   CHOLHDL 3.5 06/10/2023  Still not taking statin We will recheck lipid panel at next visit Avoid fatty fried foods lose weight        Relevant Medications   spironolactone  (ALDACTONE ) 25 MG tablet   Obesity   Insurance did not cover a GLP-1 She has been trying to eat right and does chair exercises Patient counseled on low-carb diet Encouraged engage in regular moderate exercises at least 150 minutes weekly as tolerated Wt Readings from Last 3 Encounters:  09/23/23 203 lb (92.1 kg)   07/15/23 204 lb (92.5 kg)  07/07/23 207 lb 14.3 oz (94.3 kg)   Body mass index is 37.13 kg/m.       Anxiety, generalized   Takes Xanax  as needed Has a prescription for buspirone  that she is not taking, was on zoloft  in the past  She was referred to psychiatrist but stated that she was not ready to talk with anyone about her mental health when they had called her to make an appointment. She was very tearful in the office today because she was diagnosed with HSV She currently denies SI, HI     09/23/2023    9:47  AM 07/15/2023   10:53 AM 06/10/2023    9:11 AM 03/25/2023    8:37 AM  GAD 7 : Generalized Anxiety Score  Nervous, Anxious, on Edge 2 3 2 2   Control/stop worrying 3 3 2 2   Worry too much - different things 3 3 2 2   Trouble relaxing 2 1 1 1   Restless 2 0 1 2  Easily annoyed or irritable 2 1 2 2   Afraid - awful might happen 3 1 1 1   Total GAD 7 Score 17 12 11 12   Anxiety Difficulty Somewhat difficult Somewhat difficult Very difficult Very difficult          Vaginal irritation   Relevant Orders   NuSwab Vaginitis Plus (VG+)   HSV infection - Primary   Patient complains of genital rashes that started some days ago, rashes are irritated similar to the rashes she had when she was diagnosed with HSV.  - valACYclovir  (VALTREX ) 1000 MG tablet; Take 1 tablet (1,000 mg total) by mouth daily for 5 days.  Refills provided to be taken within 1 day of lesion onset      Relevant Medications   valACYclovir  (VALTREX ) 1000 MG tablet   clotrimazole -betamethasone  (LOTRISONE ) cream    Meds ordered this encounter  Medications   valACYclovir  (VALTREX ) 1000 MG tablet    Sig: Take 1 tablet (1,000 mg total) by mouth daily.    Dispense:  5 tablet    Refill:  2   spironolactone  (ALDACTONE ) 25 MG tablet    Sig: Take 0.5 tablets (12.5 mg total) by mouth daily.    Dispense:  90 tablet    Refill:  2   clotrimazole -betamethasone  (LOTRISONE ) cream    Sig: Apply to affected area 2 times daily  prn    Dispense:  45 g    Refill:  0    Follow-up: Return in about 4 months (around 01/24/2024) for HTN.    Tiamarie Furnari R Johntavious Francom, FNP

## 2023-09-25 LAB — BASIC METABOLIC PANEL WITH GFR
BUN/Creatinine Ratio: 18 (ref 9–23)
BUN: 11 mg/dL (ref 6–24)
CO2: 20 mmol/L (ref 20–29)
Calcium: 10.1 mg/dL (ref 8.7–10.2)
Chloride: 102 mmol/L (ref 96–106)
Creatinine, Ser: 0.6 mg/dL (ref 0.57–1.00)
Glucose: 80 mg/dL (ref 70–99)
Potassium: 4.2 mmol/L (ref 3.5–5.2)
Sodium: 139 mmol/L (ref 134–144)
eGFR: 104 mL/min/1.73 (ref >59)

## 2023-09-25 LAB — NUSWAB VAGINITIS PLUS (VG+)
Atopobium vaginae: HIGH {score} — AB
BVAB 2: HIGH {score} — AB
Candida albicans, NAA: NEGATIVE
Candida glabrata, NAA: NEGATIVE
Chlamydia trachomatis, NAA: NEGATIVE
Megasphaera 1: HIGH {score} — AB
Neisseria gonorrhoeae, NAA: NEGATIVE
Trich vag by NAA: NEGATIVE

## 2023-09-26 ENCOUNTER — Ambulatory Visit: Payer: Self-pay | Admitting: Nurse Practitioner

## 2023-09-26 DIAGNOSIS — B9689 Other specified bacterial agents as the cause of diseases classified elsewhere: Secondary | ICD-10-CM

## 2023-09-26 MED ORDER — METRONIDAZOLE 500 MG PO TABS: 500.0000 mg | ORAL_TABLET | Freq: Two times a day (BID) | ORAL | 0 refills | Status: DC

## 2023-10-03 ENCOUNTER — Other Ambulatory Visit: Payer: Self-pay

## 2023-10-15 ENCOUNTER — Ambulatory Visit: Admitting: Nurse Practitioner

## 2023-10-22 ENCOUNTER — Other Ambulatory Visit: Payer: Self-pay | Admitting: Nurse Practitioner

## 2023-10-22 DIAGNOSIS — B009 Herpesviral infection, unspecified: Secondary | ICD-10-CM

## 2023-10-24 ENCOUNTER — Other Ambulatory Visit: Payer: Self-pay | Admitting: Nurse Practitioner

## 2023-10-24 DIAGNOSIS — F411 Generalized anxiety disorder: Secondary | ICD-10-CM

## 2023-11-07 ENCOUNTER — Other Ambulatory Visit: Payer: Self-pay | Admitting: Nurse Practitioner

## 2023-11-07 DIAGNOSIS — B9689 Other specified bacterial agents as the cause of diseases classified elsewhere: Secondary | ICD-10-CM

## 2023-11-07 DIAGNOSIS — R21 Rash and other nonspecific skin eruption: Secondary | ICD-10-CM

## 2023-11-07 DIAGNOSIS — F411 Generalized anxiety disorder: Secondary | ICD-10-CM

## 2023-11-07 DIAGNOSIS — G8929 Other chronic pain: Secondary | ICD-10-CM

## 2023-11-07 NOTE — Telephone Encounter (Signed)
 Please advise on Fola behalf. Thank you. KH

## 2023-11-11 NOTE — Telephone Encounter (Signed)
 Please advise [facility]

## 2023-12-01 ENCOUNTER — Other Ambulatory Visit: Payer: Self-pay

## 2023-12-01 DIAGNOSIS — F419 Anxiety disorder, unspecified: Secondary | ICD-10-CM

## 2023-12-01 DIAGNOSIS — F411 Generalized anxiety disorder: Secondary | ICD-10-CM

## 2023-12-01 MED ORDER — BUSPIRONE HCL 15 MG PO TABS: 15.0000 mg | ORAL_TABLET | Freq: Two times a day (BID) | ORAL | 1 refills | Status: DC

## 2023-12-01 NOTE — Telephone Encounter (Signed)
**Note De-identified  [place] Obfuscation** Please advise 

## 2023-12-04 ENCOUNTER — Other Ambulatory Visit: Payer: Self-pay | Admitting: Nurse Practitioner

## 2023-12-04 DIAGNOSIS — G8929 Other chronic pain: Secondary | ICD-10-CM

## 2023-12-04 DIAGNOSIS — B009 Herpesviral infection, unspecified: Secondary | ICD-10-CM

## 2023-12-15 ENCOUNTER — Other Ambulatory Visit: Payer: Self-pay | Admitting: Nurse Practitioner

## 2023-12-15 ENCOUNTER — Other Ambulatory Visit: Payer: Self-pay

## 2023-12-15 DIAGNOSIS — B009 Herpesviral infection, unspecified: Secondary | ICD-10-CM

## 2023-12-15 DIAGNOSIS — R21 Rash and other nonspecific skin eruption: Secondary | ICD-10-CM

## 2023-12-15 MED ORDER — CLOTRIMAZOLE-BETAMETHASONE 1-0.05 % EX CREA: TOPICAL_CREAM | CUTANEOUS | 0 refills | Status: DC

## 2023-12-15 MED ORDER — VALACYCLOVIR HCL 1 G PO TABS: 1000.0000 mg | ORAL_TABLET | Freq: Every day | ORAL | 2 refills | Status: DC

## 2023-12-15 NOTE — Telephone Encounter (Signed)
 Copied from CRM [id number]. Topic: Clinical - Medication Question >> Dec 15, 2023  2:20 PM Corin V wrote: Reason for CRM: Patient would like to know if the valACYclovir  (VALTREX ) 1000 MG tablet has to be a 5 day supply or if this can be sent in for a longer script so she doesn't have to run to the pharmacy for it every week. If not please provide specific reasoning. Call back: [phone]

## 2023-12-15 NOTE — Telephone Encounter (Signed)
 Copied from CRM [id number]. Topic: Clinical - Medication Refill >> Dec 15, 2023  2:17 PM Corin V wrote: Medication: clotrimazole -betamethasone  (LOTRISONE ) cream  Has the patient contacted their pharmacy? Yes (Agent: If no, request that the patient contact the pharmacy for the refill. If patient does not wish to contact the pharmacy document the reason why and proceed with request.) (Agent: If yes, when and what did the pharmacy advise?)  This is the patient's preferred pharmacy:  [id number] [place], [place] - [address] [place] [address] Phone: [phone] Fax: [phone]  Is this the correct pharmacy for this prescription? Yes If no, delete pharmacy and type the correct one.   Has the prescription been filled recently? No  Is the patient out of the medication? No  Has the patient been seen for an appointment in the last year OR does the patient have an upcoming appointment? Yes  Can we respond through MyChart? Yes  Agent: Please be advised that Rx refills may take up to 3 business days. We ask that you follow-up with your pharmacy.

## 2023-12-15 NOTE — Telephone Encounter (Signed)
 Please advise [facility]

## 2023-12-16 NOTE — Telephone Encounter (Signed)
 PT was advised. Kh

## 2024-01-26 ENCOUNTER — Encounter: Payer: Self-pay | Admitting: Nurse Practitioner

## 2024-01-26 ENCOUNTER — Ambulatory Visit (INDEPENDENT_AMBULATORY_CARE_PROVIDER_SITE_OTHER): Payer: Self-pay | Admitting: Nurse Practitioner

## 2024-01-26 VITALS — BP 125/84 | HR 66 | Wt 213.0 lb

## 2024-01-26 DIAGNOSIS — F321 Major depressive disorder, single episode, moderate: Secondary | ICD-10-CM

## 2024-01-26 DIAGNOSIS — N760 Acute vaginitis: Secondary | ICD-10-CM

## 2024-01-26 DIAGNOSIS — B372 Candidiasis of skin and nail: Secondary | ICD-10-CM | POA: Insufficient documentation

## 2024-01-26 DIAGNOSIS — F5105 Insomnia due to other mental disorder: Secondary | ICD-10-CM | POA: Insufficient documentation

## 2024-01-26 DIAGNOSIS — F411 Generalized anxiety disorder: Secondary | ICD-10-CM

## 2024-01-26 DIAGNOSIS — B009 Herpesviral infection, unspecified: Secondary | ICD-10-CM | POA: Diagnosis not present

## 2024-01-26 DIAGNOSIS — I10 Essential (primary) hypertension: Secondary | ICD-10-CM

## 2024-01-26 DIAGNOSIS — J302 Other seasonal allergic rhinitis: Secondary | ICD-10-CM

## 2024-01-26 DIAGNOSIS — F99 Mental disorder, not otherwise specified: Secondary | ICD-10-CM

## 2024-01-26 MED ORDER — AMLODIPINE BESYLATE 10 MG PO TABS: 10.0000 mg | ORAL_TABLET | Freq: Every day | ORAL | 2 refills | Status: AC

## 2024-01-26 MED ORDER — BUSPIRONE HCL 15 MG PO TABS: 15.0000 mg | ORAL_TABLET | Freq: Two times a day (BID) | ORAL | 1 refills | Status: AC

## 2024-01-26 MED ORDER — VALACYCLOVIR HCL 1 G PO TABS: 1000.0000 mg | ORAL_TABLET | Freq: Every day | ORAL | 2 refills | Status: AC

## 2024-01-26 MED ORDER — TRAZODONE HCL 50 MG PO TABS: 25.0000 mg | ORAL_TABLET | Freq: Every evening | ORAL | 1 refills | Status: AC | PRN

## 2024-01-26 MED ORDER — NYSTATIN 100000 UNIT/GM EX POWD: 1.0000 | Freq: Three times a day (TID) | CUTANEOUS | 3 refills | Status: AC

## 2024-01-26 MED ORDER — CETIRIZINE HCL 10 MG PO TABS: 10.0000 mg | ORAL_TABLET | Freq: Every day | ORAL | 11 refills | Status: AC

## 2024-01-26 MED ORDER — SPIRONOLACTONE 25 MG PO TABS: 12.5000 mg | ORAL_TABLET | Freq: Every day | ORAL | 2 refills | Status: AC

## 2024-01-26 NOTE — Assessment & Plan Note (Signed)
 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 Considering suppressive therapy. - Initiated suppressive therapy with Valtrex  1000mg  daily for one year.

## 2024-01-26 NOTE — Assessment & Plan Note (Signed)
    01/26/2024    8:28 AM 09/23/2023    9:47 AM 07/15/2023   10:53 AM 06/10/2023    9:11 AM  GAD 7 : Generalized Anxiety Score  Nervous, Anxious, on Edge 2 2 3 2   Control/stop worrying 1 3 3 2   Worry too much - different things  3 3 2   Trouble relaxing 2 2 1 1   Restless 1 2 0 1  Easily annoyed or irritable 2 2 1 2   Afraid - awful might happen 1 3 1 1   Total GAD 7 Score  17 12 11   Anxiety Difficulty Somewhat difficult Somewhat difficult Somewhat difficult Very difficult   Anxiety and Depression Experiencing anxiety and depression, exacerbated by recent family events. Zoloft  was previously ineffective. - Continue buspirone  15 mg twice daily. - Continue alprazolam  as needed. - Offered referral to therapy.

## 2024-01-26 NOTE — Assessment & Plan Note (Signed)
 BP Readings from Last 3 Encounters:  01/26/24 125/84  09/23/23 132/88  07/15/23 131/80   HTN Controlled .  On amlodipine  10 mg daily, spironolactone  12.5 mg daily Continue current medications. No changes in management. Discussed DASH diet and dietary sodium restrictions Continue to increase dietary efforts and exercise.

## 2024-01-26 NOTE — Assessment & Plan Note (Signed)
 Seasonal allergic rhinitis Using Claritin  as needed but no recent refill noted  and Flonase . Zyrtec  not picked up due to insurance issues.  Zyrtec  reordered take as needed. - Continue Flonase  nasal spray as needed.

## 2024-01-26 NOTE — Assessment & Plan Note (Addendum)
    01/26/2024    8:17 AM 09/23/2023    9:39 AM 07/15/2023   10:46 AM 06/10/2023    8:11 AM 03/25/2023    8:33 AM  Depression screen PHQ 2/9  Decreased Interest 1 1 1 1 3   Down, Depressed, Hopeless 1 2 3 1 3   PHQ - 2 Score 2 3 4 2 6   Altered sleeping 1 3 1 1 3   Tired, decreased energy 1 2 3 1 1   Change in appetite 2 0 2 2 0  Feeling bad or failure about yourself  2 2 3 1 2   Trouble concentrating 2 3 3 1 2   Moving slowly or fidgety/restless 0 1 0 1 2  Suicidal thoughts 0 0 0 0 1  PHQ-9 Score 10 14  16  9  17    Difficult doing work/chores Somewhat difficult Very difficult  Somewhat difficult Somewhat difficult     Data saved with a previous flowsheet row definition   Anxiety and Depression Experiencing anxiety and depression, exacerbated by recent family events. Zoloft  was previously ineffective. - Continue buspirone  15 mg twice daily. - Continue alprazolam  as needed. - Offered referral to therapy. Takes trazodone  50 mg as needed for sleep

## 2024-01-26 NOTE — Assessment & Plan Note (Signed)
 Current treatment causing moisture and irritation. - Discontinued clotrimazole -betamethasone  cream. - Prescribed nystatin powder to reduce moisture and irritation. Written educational material for intertrigo provided

## 2024-01-26 NOTE — Patient Instructions (Signed)
    Chronic seasonal allergic rhinitis  - cetirizine  (ZYRTEC ) 10 MG tablet; Take 1 tablet (10 mg total) by mouth daily.  Dispense: 30 tablet; Refill: 11  . HSV infection  - valACYclovir  (VALTREX ) 1000 MG tablet; Take 1 tablet (1,000 mg total) by mouth daily.  Dispense: 90 tablet; Refill: 2   Candidiasis, intertrigo (Primary)  - nystatin (MYCOSTATIN/NYSTOP) powder; Apply 1 Application topically 3 (three) times daily.  Dispense: 30 g; Refill: 3  Apply to the affected areas 3 times daily until healing is complete.    It is important that you exercise regularly at least 30 minutes 5 times a week as tolerated  Think about what you will eat, plan ahead. Choose  clean, green, fresh or frozen over canned, processed or packaged foods which are more sugary, salty and fatty. 70 to 75% of food eaten should be vegetables and fruit. Three meals at set times with snacks allowed between meals, but they must be fruit or vegetables. Aim to eat over a 12 hour period , example 7 am to 7 pm, and STOP after  your last meal of the day. Drink water,generally about 64 ounces per day, no other drink is as healthy. Fruit juice is best enjoyed in a healthy way, by EATING the fruit.  Thanks for choosing [facility] we consider it a privelige to serve you.

## 2024-01-26 NOTE — Progress Notes (Signed)
 Established Patient Office Visit  Subjective:  Patient ID: Deborah Knight, female    DOB: 06-09-1965  Age: 58 y.o. MRN: [id number]  CC:  Chief Complaint  Patient presents with   Hypertension    HPI    Discussed the use of AI scribe software for clinical note transcription with the patient, who gave verbal consent to proceed.  History of Present Illness Deborah Knight is a 58 year old female  has a past medical history of Allergic rhinitis, seasonal, Anxiety, Arthritis, Chronic low back pain, Depression, GERD (gastroesophageal reflux disease), HLD (hyperlipidemia), Hypertension, Mild intermittent asthma, Numbness of right hand, Peripheral neuropathy, Pre-diabetes, Sciatica, right side, Sleep apnea, and Wears glasses. who presents for follow-up of her blood pressure and anxiety management.  She reports taking amlodipine  10 mg daily and spironolactone  12.5 mg daily for blood pressure management.  For anxiety, she takes buspirone  15 mg twice daily and Xanax  as needed for emergencies. She experiences episodes of anxiety while driving, causing her to return home. She feels depressed, particularly around the anniversary of her son's death, and is ambivalent about seeing a psychiatrist. She previously took Zoloft  but discontinued it due to worsening mood. No thoughts of self-harm or harm to others.  She reports ongoing allergy symptoms and takes Claritin , with Benadryl  as needed for itching, and uses Flonase  nasal spray. She recently had an illness with nausea, stomach upset, and chills, which resolved after five days without fever.  She experiences recurrent vaginal issues with painful red bumps in the groin area that tingle and sometimes have fluid. She uses clotrimazole  betamethasone  cream but finds it makes the area too moist. She has a history of herpes and takes Valtrex  but reports frequent outbreaks that clear up and return quickly. She manages symptoms by washing frequently.also  taking valtrex  for suspected recurrent herpez  She is not currently taking hydroxyzine  or gabapentin , although she has some gabapentin  left from a previous prescription. She uses Flexeril  as needed for muscle relaxation.    Assessment & Plan      Past Medical History:  Diagnosis Date   Allergic rhinitis, seasonal    chronic   Anxiety    Arthritis    right foot, knees, hands, shoulder, back   Chronic low back pain    Depression    GERD (gastroesophageal reflux disease)    HLD (hyperlipidemia)    Hypertension    followed by pcp   (07-29-2019  pt stated never had a stress test)   Mild intermittent asthma    followed by pcp       Numbness of right hand    w/ pain and stiffness   Peripheral neuropathy    Pre-diabetes    followed by pcp   Sciatica, right side    Sleep apnea    Wears glasses     Past Surgical History:  Procedure Laterality Date   ABDOMINAL HERNIA REPAIR  1989   CESAREAN SECTION W/BTL  1997   EXOSTECTECTOMY TOE Right 08/04/2019   Procedure: EXOSTECTECTOMY Right MIDFOOT;  Surgeon: Gretel Ozell PARAS, DPM;  Location: [facility];  Service: Podiatry;  Laterality: Right;   SHOULDER ARTHROSCOPY WITH BICEPS TENDON REPAIR Left 09/25/2012   and Labrum repair    Family History  Problem Relation Age of Onset   Hyperlipidemia Mother    Cancer Father    Asthma Brother     Social History   Socioeconomic History   Marital status: Divorced    Spouse name: Not on  file   Number of children: 3   Years of education: Not on file   Highest education level: Some college, no degree  Occupational History   Not on file  Tobacco Use   Smoking status: Former    Current packs/day: 0.25    Average packs/day: 0.3 packs/day for 1 year (0.3 ttl pk-yrs)    Types: Cigarettes   Smokeless tobacco: Never  Vaping Use   Vaping status: Never Used  Substance and Sexual Activity   Alcohol use: No   Drug use: Not Currently    Types: Marijuana   Sexual activity: Not  on file  Other Topics Concern   Not on file  Social History Narrative   Employed as a education officer, environmental.   Social Drivers of Corporate Investment Banker Strain: Not on file  Food Insecurity: Not on file  Transportation Needs: No Transportation Needs (03/09/2019)   PRAPARE - Administrator, Civil Service (Medical): No    Lack of Transportation (Non-Medical): No  Physical Activity: Not on file  Stress: Not on file  Social Connections: Not on file  Intimate Partner Violence: Not on file    Outpatient Medications Prior to Visit  Medication Sig Dispense Refill   acetaminophen  (TYLENOL ) 500 MG tablet Take 500 mg by mouth every 6 (six) hours as needed.     albuterol  (VENTOLIN  HFA) 108 (90 Base) MCG/ACT inhaler Inhale 2 puffs into the lungs every 6 (six) hours as needed for wheezing. Shortness of breath 6.7 g 5   ALPRAZolam  (XANAX ) 0.25 MG tablet TAKE 1 TABLET BY MOUTH 2 TIMES A DAY AS NEEDED 20 tablet 0   cyclobenzaprine  (FLEXERIL ) 10 MG tablet TAKE 1 TABLET BY MOUTH EVERY NIGHT AT BEDTIME AS NEEDED FOR MUSCLE SPASMS 20 tablet 0   fluticasone  (FLONASE ) 50 MCG/ACT nasal spray Place 2 sprays into both nostrils daily as needed (congestion). 16 g 3   fluticasone -salmeterol (ADVAIR HFA) 115-21 MCG/ACT inhaler Inhale 2 puffs into the lungs 2 (two) times daily. 1 each 11   gabapentin  (NEURONTIN ) 300 MG capsule Take 1 capsule (300 mg total) by mouth at bedtime. 90 capsule 3   ibuprofen  (ADVIL ) 600 MG tablet Take 1 tablet (600 mg total) by mouth every 8 (eight) hours as needed. 30 tablet 1   omeprazole  (PRILOSEC) 20 MG capsule Take 1 capsule (20 mg total) by mouth daily. 90 capsule 3   amLODipine  (NORVASC ) 10 MG tablet Take 1 tablet (10 mg total) by mouth daily. 90 tablet 2   busPIRone  (BUSPAR ) 15 MG tablet Take 1 tablet (15 mg total) by mouth 2 (two) times daily. 90 tablet 1   cetirizine  (ZYRTEC ) 10 MG tablet Take 1 tablet (10 mg total) by mouth daily. 30 tablet 11    clotrimazole -betamethasone  (LOTRISONE ) cream APPLY TO AFFECTED AREA(S) OF THE SKIN TWICE DAILY AS NEEDED 45 g 0   spironolactone  (ALDACTONE ) 25 MG tablet Take 0.5 tablets (12.5 mg total) by mouth daily. 90 tablet 2   traZODone  (DESYREL ) 50 MG tablet Take 0.5-1 tablets (25-50 mg total) by mouth at bedtime as needed for sleep. 30 tablet 3   valACYclovir  (VALTREX ) 1000 MG tablet Take 1 tablet (1,000 mg total) by mouth daily. 30 tablet 2   hydrOXYzine  (ATARAX ) 25 MG tablet Take 25 mg by mouth. (Patient not taking: Reported on 01/26/2024)     loratadine  (CLARITIN ) 10 MG tablet Take 10 mg by mouth. (Patient not taking: Reported on 01/26/2024)     metroNIDAZOLE  (FLAGYL ) 500 MG  tablet TAKE 1 TABLET BY MOUTH 2 TIMES A DAY FOR 7 DAYS (Patient not taking: Reported on 01/26/2024) 14 tablet 0   montelukast  (SINGULAIR ) 10 MG tablet Take 1 tablet (10 mg total) by mouth at bedtime. (Patient not taking: Reported on 01/26/2024) 90 tablet 3   pravastatin  (PRAVACHOL ) 40 MG tablet Take 40 mg by mouth daily. (Patient not taking: Reported on 01/26/2024)     sertraline  (ZOLOFT ) 50 MG tablet Take 50 mg by mouth. (Patient not taking: Reported on 01/26/2024)     No facility-administered medications prior to visit.    Allergies  Allergen Reactions   Losartan  Swelling    Lip swelling   Shellfish Protein-Containing Drug Products     Other reaction(s): Angioedema (ALLERGY/intolerance)  Shrimps   Toradol  [Ketorolac  Tromethamine ] Swelling    Lip swelling   Latex Rash    ROS Review of Systems  Constitutional:  Negative for appetite change, chills, fatigue and fever.  HENT:  Negative for congestion, postnasal drip, rhinorrhea and sneezing.   Respiratory:  Negative for cough, shortness of breath and wheezing.   Cardiovascular:  Negative for chest pain, palpitations and leg swelling.  Gastrointestinal:  Negative for abdominal pain, constipation, nausea and vomiting.  Genitourinary:  Negative for difficulty urinating,  dysuria, flank pain and frequency.  Musculoskeletal:  Negative for arthralgias, back pain, joint swelling and myalgias.  Skin:  Positive for rash. Negative for color change, pallor and wound.  Neurological:  Negative for dizziness, facial asymmetry, weakness, numbness and headaches.  Psychiatric/Behavioral:  Negative for behavioral problems, confusion, self-injury and suicidal ideas.       Objective:    Physical Exam Vitals and nursing note reviewed.  Constitutional:      General: She is not in acute distress.    Appearance: Normal appearance. She is obese. She is not ill-appearing, toxic-appearing or diaphoretic.  Eyes:     General: No scleral icterus.       Right eye: No discharge.        Left eye: No discharge.     Extraocular Movements: Extraocular movements intact.     Conjunctiva/sclera: Conjunctivae normal.  Cardiovascular:     Rate and Rhythm: Normal rate and regular rhythm.     Pulses: Normal pulses.     Heart sounds: Normal heart sounds. No murmur heard.    No friction rub. No gallop.  Pulmonary:     Effort: Pulmonary effort is normal. No respiratory distress.     Breath sounds: Normal breath sounds. No stridor. No wheezing, rhonchi or rales.  Chest:     Chest wall: No tenderness.  Abdominal:     General: There is no distension.     Palpations: Abdomen is soft.     Tenderness: There is no abdominal tenderness. There is no right CVA tenderness, left CVA tenderness or guarding.  Musculoskeletal:        General: No swelling, tenderness, deformity or signs of injury.     Right lower leg: No edema.     Left lower leg: No edema.  Skin:    General: Skin is warm and dry.     Capillary Refill: Capillary refill takes less than 2 seconds.     Coloration: Skin is not jaundiced or pale.     Findings: No bruising, erythema or lesion.     Comments: Abdominal fold and bilateral groin area appears moist and irritated.  Neurological:     Mental Status: She is alert and oriented  to person, place, and time.  Motor: No weakness.     Gait: Gait normal.  Psychiatric:        Mood and Affect: Mood normal.        Behavior: Behavior normal.        Thought Content: Thought content normal.        Judgment: Judgment normal.     BP 125/84   Pulse 66   Wt 213 lb (96.6 kg)   LMP 08/13/2012   SpO2 100%   BMI 38.96 kg/m  Wt Readings from Last 3 Encounters:  01/26/24 213 lb (96.6 kg)  09/23/23 203 lb (92.1 kg)  07/15/23 204 lb (92.5 kg)    Lab Results  Component Value Date   TSH 0.627 05/30/2017   Lab Results  Component Value Date   WBC 4.6 06/10/2023   HGB 13.7 06/10/2023   HCT 41.9 06/10/2023   MCV 88 06/10/2023   PLT 291 06/10/2023   Lab Results  Component Value Date   NA 139 09/23/2023   K 4.2 09/23/2023   CO2 20 09/23/2023   GLUCOSE 80 09/23/2023   BUN 11 09/23/2023   CREATININE 0.60 09/23/2023   BILITOT 0.2 03/25/2023   ALKPHOS 90 03/25/2023   AST 15 03/25/2023   ALT 11 03/25/2023   PROT 8.0 03/25/2023   ALBUMIN 4.5 03/25/2023   CALCIUM 10.1 09/23/2023   ANIONGAP 9 05/25/2017   EGFR 104 09/23/2023   Lab Results  Component Value Date   CHOL 253 (H) 06/10/2023   Lab Results  Component Value Date   HDL 73 06/10/2023   Lab Results  Component Value Date   LDLCALC 161 (H) 06/10/2023   Lab Results  Component Value Date   TRIG 110 06/10/2023   Lab Results  Component Value Date   CHOLHDL 3.5 06/10/2023   Lab Results  Component Value Date   HGBA1C 5.7 (H) 06/10/2023      Assessment & Plan:   Problem List Items Addressed This Visit       Cardiovascular and Mediastinum   Essential hypertension   BP Readings from Last 3 Encounters:  01/26/24 125/84  09/23/23 132/88  07/15/23 131/80   HTN Controlled .  On amlodipine  10 mg daily, spironolactone  12.5 mg daily Continue current medications. No changes in management. Discussed DASH diet and dietary sodium restrictions Continue to increase dietary efforts and exercise.          Relevant Medications   amLODipine  (NORVASC ) 10 MG tablet   spironolactone  (ALDACTONE ) 25 MG tablet   Other Relevant Orders   Basic Metabolic Panel     Respiratory   Chronic seasonal allergic rhinitis   Seasonal allergic rhinitis Using Claritin  as needed but no recent refill noted  and Flonase . Zyrtec  not picked up due to insurance issues.  Zyrtec  reordered take as needed. - Continue Flonase  nasal spray as needed.      Relevant Medications   cetirizine  (ZYRTEC ) 10 MG tablet     Musculoskeletal and Integument   Candidiasis, intertrigo - Primary   Current treatment causing moisture and irritation. - Discontinued clotrimazole -betamethasone  cream. - Prescribed nystatin powder to reduce moisture and irritation. Written educational material for intertrigo provided       Relevant Medications   valACYclovir  (VALTREX ) 1000 MG tablet   nystatin (MYCOSTATIN/NYSTOP) powder     Other   Anxiety, generalized      01/26/2024    8:28 AM 09/23/2023    9:47 AM 07/15/2023   10:53 AM 06/10/2023    9:11 AM  GAD 7 : Generalized Anxiety Score  Nervous, Anxious, on Edge 2 2 3 2   Control/stop worrying 1 3 3 2   Worry too much - different things  3 3 2   Trouble relaxing 2 2 1 1   Restless 1 2 0 1  Easily annoyed or irritable 2 2 1 2   Afraid - awful might happen 1 3 1 1   Total GAD 7 Score  17 12 11   Anxiety Difficulty Somewhat difficult Somewhat difficult Somewhat difficult Very difficult   Anxiety and Depression Experiencing anxiety and depression, exacerbated by recent family events. Zoloft  was previously ineffective. - Continue buspirone  15 mg twice daily. - Continue alprazolam  as needed. - Offered referral to therapy.       Relevant Medications   busPIRone  (BUSPAR ) 15 MG tablet   traZODone  (DESYREL ) 50 MG tablet   Other Relevant Orders   AMB Referral [facility]BCI Care Management   Depression      01/26/2024    8:17 AM 09/23/2023    9:39 AM 07/15/2023   10:46 AM 06/10/2023    8:11 AM  03/25/2023    8:33 AM  Depression screen PHQ 2/9  Decreased Interest 1 1 1 1 3   Down, Depressed, Hopeless 1 2 3 1 3   PHQ - 2 Score 2 3 4 2 6   Altered sleeping 1 3 1 1 3   Tired, decreased energy 1 2 3 1 1   Change in appetite 2 0 2 2 0  Feeling bad or failure about yourself  2 2 3 1 2   Trouble concentrating 2 3 3 1 2   Moving slowly or fidgety/restless 0 1 0 1 2  Suicidal thoughts 0 0 0 0 1  PHQ-9 Score 10 14  16  9  17    Difficult doing work/chores Somewhat difficult Very difficult  Somewhat difficult Somewhat difficult     Data saved with a previous flowsheet row definition   Anxiety and Depression Experiencing anxiety and depression, exacerbated by recent family events. Zoloft  was previously ineffective. - Continue buspirone  15 mg twice daily. - Continue alprazolam  as needed. - Offered referral to therapy. 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 Considering suppressive therapy. - Initiated suppressive therapy with Valtrex  1000mg  daily for one year.        Relevant Medications   valACYclovir  (VALTREX ) 1000 MG tablet   nystatin (MYCOSTATIN/NYSTOP) powder   Other Visit Diagnoses       Anxiety and depression       Relevant Medications   busPIRone  (BUSPAR ) 15 MG tablet   traZODone  (DESYREL ) 50 MG tablet     GAD (generalized anxiety disorder)       Relevant Medications   busPIRone  (BUSPAR ) 15 MG tablet   traZODone  (DESYREL ) 50 MG tablet     Insomnia due to other mental disorder       Relevant Medications   traZODone  (DESYREL ) 50 MG tablet     Acute vaginitis       Relevant Orders   NuSwab Vaginitis Plus (VG+)       Meds ordered this encounter  Medications    cetirizine  (ZYRTEC ) 10 MG tablet    Sig: Take 1 tablet (10 mg total) by  mouth daily.    Dispense:  30 tablet    Refill:  11   valACYclovir  (VALTREX ) 1000 MG tablet    Sig: Take 1 tablet (1,000 mg total) by mouth daily.    Dispense:  90 tablet    Refill:  2   nystatin (MYCOSTATIN/NYSTOP) powder    Sig: Apply 1 Application topically 3 (three) times daily.    Dispense:  30 g    Refill:  3   busPIRone  (BUSPAR ) 15 MG tablet    Sig: Take 1 tablet (15 mg total) by mouth 2 (two) times daily.    Dispense:  180 tablet    Refill:  1   amLODipine  (NORVASC ) 10 MG tablet    Sig: Take 1 tablet (10 mg total) by mouth daily.    Dispense:  90 tablet    Refill:  2   spironolactone  (ALDACTONE ) 25 MG tablet    Sig: Take 0.5 tablets (12.5 mg total) by mouth daily.    Dispense:  90 tablet    Refill:  2   traZODone  (DESYREL ) 50 MG tablet    Sig: Take 0.5-1 tablets (25-50 mg total) by mouth at bedtime as needed for sleep.    Dispense:  90 tablet    Refill:  1    Follow-up: Return in about 4 weeks (around 02/23/2024), or rashes.    Supreme Rybarczyk R Christie Copley, FNP

## 2024-01-27 ENCOUNTER — Ambulatory Visit: Payer: Self-pay | Admitting: Nurse Practitioner

## 2024-01-27 LAB — BASIC METABOLIC PANEL WITH GFR
BUN/Creatinine Ratio: 23 (ref 9–23)
BUN: 14 mg/dL (ref 6–24)
CO2: 26 mmol/L (ref 20–29)
Calcium: 9.6 mg/dL (ref 8.7–10.2)
Chloride: 103 mmol/L (ref 96–106)
Creatinine, Ser: 0.61 mg/dL (ref 0.57–1.00)
Glucose: 77 mg/dL (ref 70–99)
Potassium: 3.8 mmol/L (ref 3.5–5.2)
Sodium: 142 mmol/L (ref 134–144)
eGFR: 103 mL/min/1.73 (ref >59)

## 2024-02-04 ENCOUNTER — Other Ambulatory Visit: Payer: Self-pay | Admitting: Nurse Practitioner

## 2024-02-04 DIAGNOSIS — B372 Candidiasis of skin and nail: Secondary | ICD-10-CM

## 2024-02-04 DIAGNOSIS — R21 Rash and other nonspecific skin eruption: Secondary | ICD-10-CM

## 2024-02-04 DIAGNOSIS — J302 Other seasonal allergic rhinitis: Secondary | ICD-10-CM

## 2024-02-04 MED ORDER — CLOTRIMAZOLE 1 % EX CREA: 1.0000 | TOPICAL_CREAM | Freq: Two times a day (BID) | CUTANEOUS | 0 refills | Status: AC

## 2024-02-06 ENCOUNTER — Telehealth: Payer: Self-pay

## 2024-02-06 NOTE — Progress Notes (Unsigned)
 Complex Care Management Note Care Guide Note  02/06/2024 Name: Deborah Knight MRN: [id number] DOB: 11-05-1965   Complex Care Management Outreach Attempts: A second unsuccessful outreach was attempted today to offer the patient with information about available complex care management services.  Follow Up Plan:  Additional outreach attempts will be made to offer the patient complex care management information and services.   Encounter Outcome:  No Answer-Left Message  [name] [facility], [facility] Guide  Direct Dial: [phone]  Fax [phone]

## 2024-02-09 NOTE — Progress Notes (Unsigned)
 Complex Care Management Note Care Guide Note  02/09/2024 Name: Deborah Knight MRN: [id number] DOB: June 28, 1965   Complex Care Management Outreach Attempts: A third unsuccessful outreach was attempted today to offer the patient with information about available complex care management services.  Follow Up Plan:  No further outreach attempts will be made at this time. We have been unable to contact the patient to offer or enroll patient in complex care management services.  Encounter Outcome:  No Answer-Left voicemail  [name] [facility], [facility] Guide  Direct Dial: [phone]  Fax [phone]

## 2024-02-10 ENCOUNTER — Telehealth: Payer: Self-pay

## 2024-02-10 NOTE — Progress Notes (Signed)
 Complex Care Management Note  Care Guide Note 02/10/2024 Name: Deborah Knight MRN: [id number] DOB: 16-Nov-1965  [name] is a 58 y.o. year old female who sees Paseda, Folashade R, FNP for primary care. I reached out to Rock Downy by phone today to offer complex care management services.  [name] was given information about Complex Care Management services today including:   The Complex Care Management services include support from the care team which includes your Nurse Care Manager, Clinical Social Worker, or Pharmacist.  The Complex Care Management team is here to help remove barriers to the health concerns and goals most important to you. Complex Care Management services are voluntary, and the patient may decline or stop services at any time by request to their care team member.   Complex Care Management Consent Status: Patient agreed to services and verbal consent obtained.   Follow up plan:  Telephone appointment with complex care management team member scheduled for:  03/09/24 @ 10 AM  Encounter Outcome:  Patient Scheduled  [name] [facility], [facility] Guide  Direct Dial: [phone]  Fax [phone]

## 2024-02-10 NOTE — Progress Notes (Signed)
 Complex Care Management Note  Care Guide Note 02/10/2024 Name: Deborah Knight MRN: [id number] DOB: November 05, 1965  Deborah Knight is a 58 y.o. year old female who sees Paseda, Folashade R, FNP for primary care. I reached out to Rock Downy by phone today to offer complex care management services.  [name] was given information about Complex Care Management services today including:   The Complex Care Management services include support from the care team which includes your Nurse Care Manager, Clinical Social Worker, or Pharmacist.  The Complex Care Management team is here to help remove barriers to the health concerns and goals most important to you. Complex Care Management services are voluntary, and the patient may decline or stop services at any time by request to their care team member.   Complex Care Management Consent Status: Patient agreed to services and verbal consent obtained.   Follow up plan:  Telephone appointment with complex care management team member scheduled for:  03/01/24 @ 2 pm  Encounter Outcome:  Patient Scheduled  [name] [facility], [facility] Guide  Direct Dial: [phone]  Fax [phone]

## 2024-02-19 ENCOUNTER — Other Ambulatory Visit: Payer: Self-pay | Admitting: Nurse Practitioner

## 2024-02-19 DIAGNOSIS — K219 Gastro-esophageal reflux disease without esophagitis: Secondary | ICD-10-CM

## 2024-02-24 ENCOUNTER — Ambulatory Visit: Payer: Self-pay | Admitting: Nurse Practitioner

## 2024-02-24 ENCOUNTER — Encounter: Payer: Self-pay | Admitting: Nurse Practitioner

## 2024-02-24 VITALS — BP 113/70 | HR 80 | Wt 212.0 lb

## 2024-02-24 DIAGNOSIS — N760 Acute vaginitis: Secondary | ICD-10-CM

## 2024-02-24 DIAGNOSIS — B009 Herpesviral infection, unspecified: Secondary | ICD-10-CM

## 2024-02-24 DIAGNOSIS — Z6838 Body mass index (BMI) 38.0-38.9, adult: Secondary | ICD-10-CM | POA: Diagnosis not present

## 2024-02-24 DIAGNOSIS — B372 Candidiasis of skin and nail: Secondary | ICD-10-CM

## 2024-02-24 DIAGNOSIS — N898 Other specified noninflammatory disorders of vagina: Secondary | ICD-10-CM

## 2024-02-24 DIAGNOSIS — I10 Essential (primary) hypertension: Secondary | ICD-10-CM

## 2024-02-24 DIAGNOSIS — R7303 Prediabetes: Secondary | ICD-10-CM

## 2024-02-24 DIAGNOSIS — M5441 Lumbago with sciatica, right side: Secondary | ICD-10-CM | POA: Diagnosis not present

## 2024-02-24 DIAGNOSIS — G8929 Other chronic pain: Secondary | ICD-10-CM

## 2024-02-24 DIAGNOSIS — R21 Rash and other nonspecific skin eruption: Secondary | ICD-10-CM

## 2024-02-24 MED ORDER — KETOROLAC TROMETHAMINE 30 MG/ML IJ SOLN
30.0000 mg | Freq: Once | INTRAMUSCULAR | Status: AC
  Administered 2024-02-24: 11:00:00 30 mg via INTRAMUSCULAR

## 2024-02-24 MED ORDER — METHYLPREDNISOLONE 4 MG PO TBPK: ORAL_TABLET | ORAL | 0 refills | Status: AC

## 2024-02-24 MED ORDER — FLUCONAZOLE 150 MG PO TABS: 150.0000 mg | ORAL_TABLET | ORAL | 0 refills | Status: AC

## 2024-02-24 MED ORDER — GABAPENTIN 300 MG PO CAPS: 300.0000 mg | ORAL_CAPSULE | Freq: Every day | ORAL | 3 refills | Status: AC

## 2024-02-24 MED ORDER — METHYLPREDNISOLONE SODIUM SUCC 40 MG IJ SOLR
40.0000 mg | Freq: Once | INTRAMUSCULAR | Status: AC
  Administered 2024-02-24: 11:00:00 40 mg via INTRAMUSCULAR

## 2024-02-24 NOTE — Assessment & Plan Note (Signed)
 Chronic low back pain with sciatica Chronic low back pain with sciatica, worsening, rated 8/10. Previous surgeries and injections. Gabapentin  and cyclobenzaprine  cause drowsiness. Cold weather exacerbates pain. - Administered Toradol  injection for pain relief. - Administered steroid injection for inflammation. - Prescribed prednisone , instructed to follow package instructions and take medication with food - Advised against taking ibuprofen  while on prednisone . - Referred to sports medicine specialist. - Continue gabapentin  300 mg at bedtime. - Use ibuprofen  800 mg 3 times daily as needed. Flexeril  discontinued due to side effect of drowsiness

## 2024-02-24 NOTE — Assessment & Plan Note (Signed)
 Lab Results  Component Value Date   HGBA1C 5.7 (H) 06/10/2023  Patient counseled on low-carb diet

## 2024-02-24 NOTE — Assessment & Plan Note (Signed)
 Genital herpes simplex virus infection Continue Valtrex  1000 mg daily

## 2024-02-24 NOTE — Patient Instructions (Signed)
 For your low back pain you were given solumedrol 40mg  injection and toradol  30mg  injection in the office today.   Please start taking Medrol  Dosepak from tomorrow, take medication with food.  Do not take Aleve  or ibuprofen  when taking the medication  Okay to take gabapentin  milligrams at bedtime, stop taking Flexeril   For the rashes on your abdomen please take fluconazole  150 mg once a week for 4 weeks.  Stop using Lotrisone  cream and nystatin  powder for now .  Follow-up with the dermatologist as planned  It is important that you exercise regularly at least 30 minutes 5 times a week as tolerated  Think about what you will eat, plan ahead. Choose  clean, green, fresh or frozen over canned, processed or packaged foods which are more sugary, salty and fatty. 70 to 75% of food eaten should be vegetables and fruit. Three meals at set times with snacks allowed between meals, but they must be fruit or vegetables. Aim to eat over a 12 hour period , example 7 am to 7 pm, and STOP after  your last meal of the day. Drink water,generally about 64 ounces per day, no other drink is as healthy. Fruit juice is best enjoyed in a healthy way, by EATING the fruit.  Thanks for choosing [facility] we consider it a privelige to serve you.

## 2024-02-24 NOTE — Assessment & Plan Note (Addendum)
 Wt Readings from Last 3 Encounters:  02/24/24 212 lb (96.2 kg)  01/26/24 213 lb (96.6 kg)  09/23/23 203 lb (92.1 kg)   Body mass index is 38.78 kg/m.  Counseled on low-carb diet Encouraged regular moderate exercises at least 150 minutes weekly as tolerated

## 2024-02-24 NOTE — Progress Notes (Signed)
 Established Patient Office Visit  Subjective:  Patient ID: Deborah Knight, female    DOB: [birth year]  Age: 58 y.o. MRN: [id number]  CC:  Chief Complaint  Patient presents with   Rash    Lotrimin  cream and nystatin  powder was ineffective, would like to know if theres an alternative    Medication Refill    Gabapentin     Pain    Lower back and hip     HPI    Discussed the use of AI scribe software for clinical note transcription with the patient, who gave verbal consent to proceed.  History of Present Illness Deborah Knight is a 58 year old female  has a past medical history of Allergic rhinitis, seasonal, Anxiety, Arthritis, Chronic low back pain, Depression, GERD (gastroesophageal reflux disease), HLD (hyperlipidemia), Hypertension, Mild intermittent asthma, Numbness of right hand, Peripheral neuropathy, Pre-diabetes, Sciatica, right side, Sleep apnea, and Wears glasses.  who presents for her rashes and vaginal itching  She experiences chronic lower back pain that radiates down her leg and into her pelvic area. The pain is severe, rated as 8 out of 10 today, and has been ongoing for years, sometimes preventing her from walking or getting out of bed. She has not pursued back surgery due to fear, although she has had two foot surgeries and received injections for foot pain management in the past. Currently, she is out of her medications, including ibuprofen , which she takes as needed for back pain, and cyclobenzaprine , which causes drowsiness. She also uses gabapentin  300 mg as needed at night for nerve pain. She is concerned about the amount of medication she is taking.  She reports recurrent rashes appearing as red bumps and tingling sensations, primarily between her thighs and on the top of her private area. Diagnosed with herpes, she is on Valtrex  1000 mg daily but feels it is not effectively managing her outbreaks. The rashes are sometimes itchy and have left her skin  discolored, resembling a birthmark. She has been using Lotrisone  cream and nystatin  powder to manage symptoms, . She also has a history of bacterial vaginosis, treated five months ago, and is concerned about the persistent nature of her skin issues and the impact on her daily life, including discomfort from wearing regular panties.  She is actively trying to lose weight and has been making dietary changes, such as eating before 7 PM and increasing her intake of fruits and vegetables.  Assessment & Plan       Wt Readings from Last 3 Encounters:  02/24/24 212 lb (96.2 kg)  01/26/24 213 lb (96.6 kg)  09/23/23 203 lb (92.1 kg)     Past Medical History:  Diagnosis Date   Allergic rhinitis, seasonal    chronic   Anxiety    Arthritis    right foot, knees, hands, shoulder, back   Chronic low back pain    Depression    GERD (gastroesophageal reflux disease)    HLD (hyperlipidemia)    Hypertension    followed by pcp   (07-29-2019  pt stated never had a stress test)   Mild intermittent asthma    followed by pcp       Numbness of right hand    w/ pain and stiffness   Peripheral neuropathy    Pre-diabetes    followed by pcp   Sciatica, right side    Sleep apnea    Wears glasses     Past Surgical History:  Procedure Laterality Date   ABDOMINAL  HERNIA REPAIR  1989   CESAREAN SECTION W/BTL  1997   EXOSTECTECTOMY TOE Right 08/04/2019   Procedure: EXOSTECTECTOMY Right MIDFOOT;  Surgeon: Gretel Ozell PARAS, DPM;  Location: [facility];  Service: Podiatry;  Laterality: Right;   SHOULDER ARTHROSCOPY WITH BICEPS TENDON REPAIR Left 09/25/2012   and Labrum repair    Family History  Problem Relation Age of Onset   Hyperlipidemia Mother    Cancer Father    Asthma Brother     Social History   Socioeconomic History   Marital status: Divorced    Spouse name: Not on file   Number of children: 3   Years of education: Not on file   Highest education level: Some college,  no degree  Occupational History   Not on file  Tobacco Use   Smoking status: Former    Current packs/day: 0.25    Average packs/day: 0.3 packs/day for 1 year (0.3 ttl pk-yrs)    Types: Cigarettes   Smokeless tobacco: Never  Vaping Use   Vaping status: Never Used  Substance and Sexual Activity   Alcohol use: No   Drug use: Not Currently    Types: Marijuana   Sexual activity: Not on file  Other Topics Concern   Not on file  Social History Narrative   Employed as a education officer, environmental.   Social Drivers of Health   Tobacco Use: Medium Risk (01/26/2024)   Patient History    Smoking Tobacco Use: Former    Smokeless Tobacco Use: Never    Passive Exposure: Not on Actuary Strain: Not on file  Food Insecurity: Not on file  Transportation Needs: Not on file  Physical Activity: Not on file  Stress: Not on file  Social Connections: Not on file  Intimate Partner Violence: Not on file  Depression (PHQ2-9): Medium Risk (01/26/2024)   Depression (PHQ2-9)    PHQ-2 Score: 10  Alcohol Screen: Not on file  Housing: Not on file  Utilities: Not on file  Health Literacy: Not on file    Outpatient Medications Prior to Visit  Medication Sig Dispense Refill   acetaminophen  (TYLENOL ) 500 MG tablet Take 500 mg by mouth every 6 (six) hours as needed.     albuterol  (VENTOLIN  HFA) 108 (90 Base) MCG/ACT inhaler Inhale 2 puffs into the lungs every 6 (six) hours as needed for wheezing. Shortness of breath 6.7 g 5   ALPRAZolam  (XANAX ) 0.25 MG tablet TAKE 1 TABLET BY MOUTH 2 TIMES A DAY AS NEEDED 20 tablet 0   amLODipine  (NORVASC ) 10 MG tablet Take 1 tablet (10 mg total) by mouth daily. 90 tablet 2   busPIRone  (BUSPAR ) 15 MG tablet Take 1 tablet (15 mg total) by mouth 2 (two) times daily. 180 tablet 1   cetirizine  (ZYRTEC ) 10 MG tablet Take 1 tablet (10 mg total) by mouth daily. 30 tablet 11   clotrimazole  (LOTRIMIN ) 1 % cream Apply 1 Application topically 2 (two) times daily. 30 g 0    fluticasone  (FLONASE ) 50 MCG/ACT nasal spray Place 2 sprays into both nostrils daily as needed (congestion). 16 g 3   fluticasone -salmeterol (ADVAIR HFA) 115-21 MCG/ACT inhaler Inhale 2 puffs into the lungs 2 (two) times daily. 1 each 11   ibuprofen  (ADVIL ) 600 MG tablet Take 1 tablet (600 mg total) by mouth every 8 (eight) hours as needed. 30 tablet 1   nystatin  (MYCOSTATIN /NYSTOP ) powder Apply 1 Application topically 3 (three) times daily. 30 g 3   omeprazole  (PRILOSEC) 20  MG capsule TAKE 1 CAPSULE BY MOUTH DAILY 90 capsule 3   spironolactone  (ALDACTONE ) 25 MG tablet Take 0.5 tablets (12.5 mg total) by mouth daily. 90 tablet 2   traZODone  (DESYREL ) 50 MG tablet Take 0.5-1 tablets (25-50 mg total) by mouth at bedtime as needed for sleep. 90 tablet 1   valACYclovir  (VALTREX ) 1000 MG tablet Take 1 tablet (1,000 mg total) by mouth daily. 90 tablet 2   cyclobenzaprine  (FLEXERIL ) 10 MG tablet TAKE 1 TABLET BY MOUTH EVERY NIGHT AT BEDTIME AS NEEDED FOR MUSCLE SPASMS 20 tablet 0   gabapentin  (NEURONTIN ) 300 MG capsule Take 1 capsule (300 mg total) by mouth at bedtime. 90 capsule 3   No facility-administered medications prior to visit.    Allergies[1]  ROS Review of Systems  Constitutional:  Negative for appetite change, chills, fatigue and fever.  HENT:  Negative for congestion, postnasal drip, rhinorrhea and sneezing.   Respiratory:  Negative for cough, shortness of breath and wheezing.   Cardiovascular:  Negative for chest pain, palpitations and leg swelling.  Gastrointestinal:  Negative for abdominal pain, constipation, nausea and vomiting.  Genitourinary:  Negative for difficulty urinating, dysuria, flank pain and frequency.  Musculoskeletal:  Positive for arthralgias and back pain. Negative for joint swelling and myalgias.  Skin:  Positive for rash. Negative for color change, pallor and wound.  Neurological:  Negative for dizziness, facial asymmetry, weakness, numbness and headaches.   Psychiatric/Behavioral:  Negative for behavioral problems, confusion, self-injury and suicidal ideas.       Objective:    Physical Exam Vitals and nursing note reviewed.  Constitutional:      General: She is not in acute distress.    Appearance: Normal appearance. She is obese. She is not ill-appearing, toxic-appearing or diaphoretic.  HENT:     Mouth/Throat:     Mouth: Mucous membranes are moist.     Pharynx: Oropharynx is clear. No oropharyngeal exudate or posterior oropharyngeal erythema.  Eyes:     General: No scleral icterus.       Right eye: No discharge.        Left eye: No discharge.     Extraocular Movements: Extraocular movements intact.     Conjunctiva/sclera: Conjunctivae normal.  Cardiovascular:     Rate and Rhythm: Normal rate and regular rhythm.     Pulses: Normal pulses.     Heart sounds: Normal heart sounds. No murmur heard.    No friction rub. No gallop.  Pulmonary:     Effort: Pulmonary effort is normal. No respiratory distress.     Breath sounds: Normal breath sounds. No stridor. No wheezing, rhonchi or rales.  Chest:     Chest wall: No tenderness.  Abdominal:     General: There is no distension.     Palpations: Abdomen is soft.     Tenderness: There is no abdominal tenderness. There is no right CVA tenderness, left CVA tenderness or guarding.  Musculoskeletal:        General: Tenderness present. No swelling, deformity or signs of injury.     Right lower leg: No edema.     Left lower leg: No edema.     Comments: Tenderness on palpation of low back area.  Using a cane for ambulation  Skin:    General: Skin is warm and dry.     Capillary Refill: Capillary refill takes less than 2 seconds.     Coloration: Skin is not jaundiced or pale.     Findings: Rash present. No  bruising, erythema or lesion.     Comments: Abdominal fold appears moist and red  Neurological:     Mental Status: She is alert and oriented to person, place, and time.     Motor: No  weakness.  Psychiatric:        Mood and Affect: Mood normal.        Behavior: Behavior normal.        Thought Content: Thought content normal.        Judgment: Judgment normal.     BP 113/70 (BP Location: Right Arm, Patient Position: Sitting, Cuff Size: Large)   Pulse 80   Wt 212 lb (96.2 kg)   LMP 08/13/2012   SpO2 98%   BMI 38.78 kg/m  Wt Readings from Last 3 Encounters:  02/24/24 212 lb (96.2 kg)  01/26/24 213 lb (96.6 kg)  09/23/23 203 lb (92.1 kg)    Lab Results  Component Value Date   TSH 0.627 05/30/2017   Lab Results  Component Value Date   WBC 4.6 06/10/2023   HGB 13.7 06/10/2023   HCT 41.9 06/10/2023   MCV 88 06/10/2023   PLT 291 06/10/2023   Lab Results  Component Value Date   NA 142 01/26/2024   K 3.8 01/26/2024   CO2 26 01/26/2024   GLUCOSE 77 01/26/2024   BUN 14 01/26/2024   CREATININE 0.61 01/26/2024   BILITOT 0.2 03/25/2023   ALKPHOS 90 03/25/2023   AST 15 03/25/2023   ALT 11 03/25/2023   PROT 8.0 03/25/2023   ALBUMIN 4.5 03/25/2023   CALCIUM 9.6 01/26/2024   ANIONGAP 9 05/25/2017   EGFR 103 01/26/2024   Lab Results  Component Value Date   CHOL 253 (H) 06/10/2023   Lab Results  Component Value Date   HDL 73 06/10/2023   Lab Results  Component Value Date   LDLCALC 161 (H) 06/10/2023   Lab Results  Component Value Date   TRIG 110 06/10/2023   Lab Results  Component Value Date   CHOLHDL 3.5 06/10/2023   Lab Results  Component Value Date   HGBA1C 5.7 (H) 06/10/2023      Assessment & Plan:   Problem List Items Addressed This Visit       Cardiovascular and Mediastinum   Essential hypertension   BP Readings from Last 3 Encounters:  02/24/24 113/70  01/26/24 125/84  09/23/23 132/88  Continue spironolactone  12.5 mg daily, amlodipine  10 mg daily        Musculoskeletal and Integument   Candidiasis, intertrigo   Intertrigo of groin and abdominal folds Intertrigo with skin discoloration. Reports itching and  tingling. - Referred to dermatologist. - Advised to  to keep area dry. Trial of fluconazole  150 mg once weekly for 4 weeks ordered.  Hold off on using Lotrisone  cream and nystatin  powder for now Advised to lose weight        Relevant Medications   fluconazole  (DIFLUCAN ) 150 MG tablet   RESOLVED: Rash   Relevant Orders   Ambulatory referral to Dermatology     Genitourinary   Acute vaginitis   Relevant Orders   NuSwab Vaginitis Plus (VG+)     Other   Morbid obesity (HCC)   Wt Readings from Last 3 Encounters:  02/24/24 212 lb (96.2 kg)  01/26/24 213 lb (96.6 kg)  09/23/23 203 lb (92.1 kg)   Body mass index is 38.78 kg/m.  Counseled on low-carb diet Encouraged regular moderate exercises at least 150 minutes weekly as tolerated  LOW BACK PAIN SYNDROME - Primary   Chronic low back pain with sciatica Chronic low back pain with sciatica, worsening, rated 8/10. Previous surgeries and injections. Gabapentin  and cyclobenzaprine  cause drowsiness. Cold weather exacerbates pain. - Administered Toradol  injection for pain relief. - Administered steroid injection for inflammation. - Prescribed prednisone , instructed to follow package instructions and take medication with food - Advised against taking ibuprofen  while on prednisone . - Referred to sports medicine specialist. - Continue gabapentin  300 mg at bedtime. - Use ibuprofen  800 mg 3 times daily as needed. Flexeril  discontinued due to side effect of drowsiness       Relevant Medications   gabapentin  (NEURONTIN ) 300 MG capsule   methylPREDNISolone  (MEDROL  DOSEPAK) 4 MG TBPK tablet (Start on 02/25/2024)   Other Relevant Orders   Ambulatory referral to Sports Medicine   Prediabetes   Lab Results  Component Value Date   HGBA1C 5.7 (H) 06/10/2023  Patient counseled on low-carb diet      Vaginal irritation   NuSwab ordered       HSV infection   Genital herpes simplex virus infection Continue Valtrex  1000 mg daily        Relevant Medications   fluconazole  (DIFLUCAN ) 150 MG tablet    Meds ordered this encounter  Medications   gabapentin  (NEURONTIN ) 300 MG capsule    Sig: Take 1 capsule (300 mg total) by mouth at bedtime.    Dispense:  90 capsule    Refill:  3   fluconazole  (DIFLUCAN ) 150 MG tablet    Sig: Take 1 tablet (150 mg total) by mouth once a week.    Dispense:  4 tablet    Refill:  0   methylPREDNISolone  (MEDROL  DOSEPAK) 4 MG TBPK tablet    Sig: Take as instructed on the packaging    Dispense:  1 each    Refill:  0   ketorolac  (TORADOL ) 30 MG/ML injection 30 mg   methylPREDNISolone  sodium succinate (SOLU-MEDROL ) 40 mg/mL injection 40 mg    Follow-up: Return in about 4 months (around 06/24/2024) for CPE.    Leland Raver R Hershal Eriksson, FNP     [1]  Allergies Allergen Reactions   Losartan  Swelling    Lip swelling   Shellfish Protein-Containing Drug Products     Other reaction(s): Angioedema (ALLERGY/intolerance)  Shrimps   Toradol  [Ketorolac  Tromethamine ] Swelling    Lip swelling   Latex Rash

## 2024-02-24 NOTE — Assessment & Plan Note (Signed)
 Intertrigo of groin and abdominal folds Intertrigo with skin discoloration. Reports itching and tingling. - Referred to dermatologist. - Advised to  to keep area dry. Trial of fluconazole  150 mg once weekly for 4 weeks ordered.  Hold off on using Lotrisone  cream and nystatin  powder for now Advised to lose weight

## 2024-02-24 NOTE — Assessment & Plan Note (Signed)
 BP Readings from Last 3 Encounters:  02/24/24 113/70  01/26/24 125/84  09/23/23 132/88  Continue spironolactone  12.5 mg daily, amlodipine  10 mg daily

## 2024-02-24 NOTE — Assessment & Plan Note (Addendum)
 NuSwab ordered

## 2024-02-26 LAB — POCT GLYCOSYLATED HEMOGLOBIN (HGB A1C)
HbA1c POC (<> result, manual entry): 5.5 % (ref 4.0–5.6)
HbA1c, POC (controlled diabetic range): 5.5 % (ref 0.0–7.0)
HbA1c, POC (prediabetic range): 5.5 % — AB (ref 5.7–6.4)
Hemoglobin A1C: 5.5 % (ref 4.0–5.6)

## 2024-02-26 NOTE — Addendum Note (Signed)
 Addended by: Jarquis Walker R on: 02/26/2024 09:25 AM   Modules accepted: Orders

## 2024-02-27 ENCOUNTER — Ambulatory Visit: Payer: Self-pay | Admitting: Nurse Practitioner

## 2024-02-27 DIAGNOSIS — B9689 Other specified bacterial agents as the cause of diseases classified elsewhere: Secondary | ICD-10-CM

## 2024-02-27 LAB — NUSWAB VAGINITIS PLUS (VG+)
Atopobium vaginae: HIGH {score} — AB
BVAB 2: HIGH {score} — AB
Candida albicans, NAA: NEGATIVE
Candida glabrata, NAA: NEGATIVE
Megasphaera 1: HIGH {score} — AB

## 2024-02-27 MED ORDER — METRONIDAZOLE 500 MG PO TABS: 500.0000 mg | ORAL_TABLET | Freq: Two times a day (BID) | ORAL | 0 refills | Status: AC

## 2024-03-06 ENCOUNTER — Emergency Department (HOSPITAL_COMMUNITY)
Admission: EM | Admit: 2024-03-06 | Discharge: 2024-03-06 | Disposition: A | Discharge status: Home / Self Care | Attending: Emergency Medicine | Admitting: Emergency Medicine

## 2024-03-06 DIAGNOSIS — Z91040 Latex allergy status: Secondary | ICD-10-CM | POA: Diagnosis not present

## 2024-03-06 DIAGNOSIS — Z7951 Long term (current) use of inhaled steroids: Secondary | ICD-10-CM | POA: Diagnosis not present

## 2024-03-06 DIAGNOSIS — M545 Low back pain, unspecified: Secondary | ICD-10-CM | POA: Diagnosis present

## 2024-03-06 DIAGNOSIS — M5441 Lumbago with sciatica, right side: Secondary | ICD-10-CM | POA: Diagnosis not present

## 2024-03-06 DIAGNOSIS — I10 Essential (primary) hypertension: Secondary | ICD-10-CM | POA: Insufficient documentation

## 2024-03-06 DIAGNOSIS — J45909 Unspecified asthma, uncomplicated: Secondary | ICD-10-CM | POA: Diagnosis not present

## 2024-03-06 DIAGNOSIS — Z79899 Other long term (current) drug therapy: Secondary | ICD-10-CM | POA: Insufficient documentation

## 2024-03-06 DIAGNOSIS — F17200 Nicotine dependence, unspecified, uncomplicated: Secondary | ICD-10-CM | POA: Diagnosis not present

## 2024-03-06 MED ORDER — HYDROMORPHONE HCL 1 MG/ML IJ SOLN
0.5000 mg | Freq: Once | INTRAMUSCULAR | Status: AC
  Administered 2024-03-06: 0.5 mg via SUBCUTANEOUS
  Filled 2024-03-06: qty 1

## 2024-03-06 MED ORDER — LIDOCAINE 5 % EX PTCH
1.0000 | MEDICATED_PATCH | CUTANEOUS | Status: DC
  Administered 2024-03-06: 1 via TRANSDERMAL
  Filled 2024-03-06: qty 1

## 2024-03-06 MED ORDER — LIDOCAINE 5 % EX PTCH: 1.0000 | MEDICATED_PATCH | CUTANEOUS | 0 refills | Status: AC

## 2024-03-06 MED ORDER — OXYCODONE-ACETAMINOPHEN 5-325 MG PO TABS
1.0000 | ORAL_TABLET | Freq: Once | ORAL | Status: AC
  Administered 2024-03-06: 1 via ORAL
  Filled 2024-03-06: qty 1

## 2024-03-06 MED ORDER — OXYCODONE-ACETAMINOPHEN 5-325 MG PO TABS: 1.0000 | ORAL_TABLET | Freq: Four times a day (QID) | ORAL | 0 refills | Status: AC | PRN

## 2024-03-06 NOTE — ED Provider Notes (Signed)
 " [facility] AT [facility] Provider Note   CSN: [id number] Arrival date & time: 03/06/24  1455     Patient presents with: Back Pain   Deborah Knight is a 58 y.o. female with past medical history of HLD, HTN, asthma, tobacco abuse, sciatica presents Emergency Department for evaluation of bilateral low back pain that has been worsening over the past 2 days.  Pain is worsened to the point where she has pain with walking.  Was evaluated by her PCP on 02/24/2024 who provided her with steroid shot,  {Add pertinent medical, surgical, social history, OB history to HPI:32947}  Back Pain      Prior to Admission medications  Medication Sig Start Date End Date Taking? Authorizing Provider  acetaminophen  (TYLENOL ) 500 MG tablet Take 500 mg by mouth every 6 (six) hours as needed.    [provider]  albuterol  (VENTOLIN  HFA) 108 (90 Base) MCG/ACT inhaler Inhale 2 puffs into the lungs every 6 (six) hours as needed for wheezing. Shortness of breath 10/14/18   Vicenta Maduro, FNP  ALPRAZolam  (XANAX ) 0.25 MG tablet TAKE 1 TABLET BY MOUTH 2 TIMES A DAY AS NEEDED 11/07/23   Oley Bascom RAMAN, NP  amLODipine  (NORVASC ) 10 MG tablet Take 1 tablet (10 mg total) by mouth daily. 01/26/24   Paseda, Folashade R, FNP  busPIRone  (BUSPAR ) 15 MG tablet Take 1 tablet (15 mg total) by mouth 2 (two) times daily. 01/26/24   Paseda, Folashade R, FNP  cetirizine  (ZYRTEC ) 10 MG tablet Take 1 tablet (10 mg total) by mouth daily. 01/26/24   Paseda, Folashade R, FNP  clotrimazole  (LOTRIMIN ) 1 % cream Apply 1 Application topically 2 (two) times daily. 02/04/24   Paseda, Folashade R, FNP  fluconazole  (DIFLUCAN ) 150 MG tablet Take 1 tablet (150 mg total) by mouth once a week. 02/24/24   Paseda, Folashade R, FNP  fluticasone  (FLONASE ) 50 MCG/ACT nasal spray Place 2 sprays into both nostrils daily as needed (congestion). 06/10/23   Paseda, Folashade R, FNP  fluticasone -salmeterol (ADVAIR HFA)  115-21 MCG/ACT inhaler Inhale 2 puffs into the lungs 2 (two) times daily. 03/25/23   Paseda, Folashade R, FNP  gabapentin  (NEURONTIN ) 300 MG capsule Take 1 capsule (300 mg total) by mouth at bedtime. 02/24/24   Paseda, Folashade R, FNP  ibuprofen  (ADVIL ) 600 MG tablet Take 1 tablet (600 mg total) by mouth every 8 (eight) hours as needed. 06/10/23   Paseda, Folashade R, FNP  methylPREDNISolone  (MEDROL  DOSEPAK) 4 MG TBPK tablet Take as instructed on the packaging 02/25/24   Paseda, Folashade R, FNP  nystatin  (MYCOSTATIN /NYSTOP ) powder Apply 1 Application topically 3 (three) times daily. 01/26/24   Paseda, Folashade R, FNP  omeprazole  (PRILOSEC) 20 MG capsule TAKE 1 CAPSULE BY MOUTH DAILY 02/19/24   Paseda, Folashade R, FNP  spironolactone  (ALDACTONE ) 25 MG tablet Take 0.5 tablets (12.5 mg total) by mouth daily. 01/26/24   Paseda, Folashade R, FNP  traZODone  (DESYREL ) 50 MG tablet Take 0.5-1 tablets (25-50 mg total) by mouth at bedtime as needed for sleep. 01/26/24   Paseda, Folashade R, FNP  valACYclovir  (VALTREX ) 1000 MG tablet Take 1 tablet (1,000 mg total) by mouth daily. 01/26/24   Paseda, Folashade R, FNP    Allergies: Losartan , Shellfish protein-containing drug products, Toradol  [ketorolac  tromethamine ], and Latex    Review of Systems  Musculoskeletal:  Positive for back pain.    Updated Vital Signs BP 115/65 (BP Location: Right Arm)   Pulse 78   Temp 98.2 F (  36.8 C) (Oral)   Resp 16   Ht 5' 3 (1.6 m)   Wt 96.2 kg   LMP 08/13/2012   SpO2 95%   BMI 37.55 kg/m   Physical Exam  (all labs ordered are listed, but only abnormal results are displayed) Labs Reviewed - No data to display  EKG: None  Radiology: No results found.  {Document cardiac monitor, telemetry assessment procedure when appropriate:32947} Procedures   Medications Ordered in the ED  lidocaine  (LIDODERM ) 5 % 1 patch (1 patch Transdermal Patch Applied 03/06/24 1629)  oxyCODONE -acetaminophen  (PERCOCET/ROXICET)  5-325 MG per tablet 1 tablet (1 tablet Oral Given 03/06/24 1628)      {Click here for ABCD2, HEART and other calculators REFRESH Note before signing:1}                              Medical Decision Making Risk Prescription drug management.   ***  {Document critical care time when appropriate  Document review of labs and clinical decision tools ie CHADS2VASC2, etc  Document your independent review of radiology images and any outside records  Document your discussion with family members, caretakers and with consultants  Document social determinants of health affecting pt's care  Document your decision making why or why not admission, treatments were needed:32947:::1}   Final diagnoses:  None    ED Discharge Orders     None        "

## 2024-03-06 NOTE — ED Triage Notes (Signed)
 Patient c/o sciatic nerve x 2 days, worse today Pain 10/10

## 2024-03-06 NOTE — ED Notes (Signed)
 Pt getting picked up by brother.

## 2024-03-06 NOTE — Discharge Instructions (Addendum)
 Thank you for letting us  evaluate you today.  We have given you pain medicine here Emergency Department as well as a lidocaine  patch for your pain with improvement.  I have sent lidocaine  patches and a short course of Percocet to your pharmacy. Do not take tylenol  with percocet. Make sure to space out muscle relaxer with percocet at least four hours. No gabapentin  with percocet and flexeril /muscle relaxer as these can make you drowsy. You can take percocet starting tomorrow. I have also provided you with sciatica stretches to do at home to help with sciatica pain.  Please follow-up with your PCP for further management  Return to Emergency Department if you experience loss of urinary continence where you do not know that you need to use the restroom and pee on yourself, loss of sensation in genital region, worsening symptoms

## 2024-03-06 NOTE — ED Provider Triage Note (Signed)
 Emergency Medicine Provider Triage Evaluation Note  Deborah Knight , a 58 y.o. female  was evaluated in triage.  Pt complains of low back pain for past two weeks. Hx of sciatica and feels similarly but worse. Saw PCP for pain 02/25/24 who provided steroid shot, toradol  shot in office as well as prescription for gabapentin , prednisone , flexeril .   Had gabapentin  and flexeril  this morning  No urinary symptoms, hx of stone. No saddle paresthesia. No hx of cancer. No IVDU  Review of Systems  Positive: See hpi Negative:   Physical Exam  BP 115/65 (BP Location: Right Arm)   Pulse 78   Temp 98.2 F (36.8 C) (Oral)   Resp 16   Ht 5' 3 (1.6 m)   Wt 96.2 kg   LMP 08/13/2012   SpO2 95%   BMI 37.55 kg/m  Gen:   Awake. Uncomfortable 2/2 pain  Resp:  Normal effort  MSK:   Moves extremities without difficulty  Other:  Motor 5/5 and sensation 2/2 of BLE. Midline lumbar and bilateral paraspinous tenderness  Medical Decision Making  Medically screening exam initiated at 4:03 PM.  Appropriate orders placed.  Jaki Steptoe was informed that the remainder of the evaluation will be completed by another provider, this initial triage assessment does not replace that evaluation, and the importance of remaining in the ED until their evaluation is complete.  No alarm sx. Low suspicion for abscess, cauda equina Analgesia ordered   Minnie Tinnie BRAVO, PA 03/06/24 1611

## 2024-03-08 ENCOUNTER — Emergency Department (HOSPITAL_BASED_OUTPATIENT_CLINIC_OR_DEPARTMENT_OTHER)
Admission: EM | Admit: 2024-03-08 | Discharge: 2024-03-08 | Disposition: A | Discharge status: Home / Self Care | Source: Ambulatory Visit | Attending: Emergency Medicine | Admitting: Emergency Medicine

## 2024-03-08 ENCOUNTER — Emergency Department (HOSPITAL_BASED_OUTPATIENT_CLINIC_OR_DEPARTMENT_OTHER)

## 2024-03-08 ENCOUNTER — Ambulatory Visit: Payer: Self-pay

## 2024-03-08 ENCOUNTER — Other Ambulatory Visit: Payer: Self-pay

## 2024-03-08 DIAGNOSIS — M5442 Lumbago with sciatica, left side: Secondary | ICD-10-CM | POA: Diagnosis not present

## 2024-03-08 DIAGNOSIS — Z79899 Other long term (current) drug therapy: Secondary | ICD-10-CM | POA: Insufficient documentation

## 2024-03-08 DIAGNOSIS — J45909 Unspecified asthma, uncomplicated: Secondary | ICD-10-CM | POA: Insufficient documentation

## 2024-03-08 DIAGNOSIS — G8929 Other chronic pain: Secondary | ICD-10-CM | POA: Insufficient documentation

## 2024-03-08 DIAGNOSIS — I10 Essential (primary) hypertension: Secondary | ICD-10-CM | POA: Diagnosis not present

## 2024-03-08 DIAGNOSIS — M5441 Lumbago with sciatica, right side: Secondary | ICD-10-CM | POA: Diagnosis not present

## 2024-03-08 DIAGNOSIS — Z91040 Latex allergy status: Secondary | ICD-10-CM | POA: Insufficient documentation

## 2024-03-08 DIAGNOSIS — M545 Low back pain, unspecified: Secondary | ICD-10-CM | POA: Diagnosis present

## 2024-03-08 MED ORDER — ACETAMINOPHEN 500 MG PO TABS
1000.0000 mg | ORAL_TABLET | Freq: Once | ORAL | Status: AC
  Administered 2024-03-08: 1000 mg via ORAL
  Filled 2024-03-08: qty 2

## 2024-03-08 MED ORDER — LIDOCAINE 5 % EX PTCH
1.0000 | MEDICATED_PATCH | CUTANEOUS | Status: DC
  Administered 2024-03-08: 1 via TRANSDERMAL
  Filled 2024-03-08: qty 1

## 2024-03-08 MED ORDER — DEXAMETHASONE SOD PHOSPHATE PF 10 MG/ML IJ SOLN
8.0000 mg | Freq: Once | INTRAMUSCULAR | Status: AC
  Administered 2024-03-08: 8 mg via INTRAMUSCULAR

## 2024-03-08 MED ORDER — METHOCARBAMOL 500 MG PO TABS
500.0000 mg | ORAL_TABLET | Freq: Once | ORAL | Status: AC
  Administered 2024-03-08: 500 mg via ORAL
  Filled 2024-03-08: qty 1

## 2024-03-08 NOTE — ED Provider Triage Note (Signed)
 Emergency Medicine Provider Triage Evaluation Note  Deborah Knight , a 58 y.o. female  was evaluated in triage.  Pt complains of BL sciatica worsening since Thursday. Hx of sciatica. Last BM Saturday. Has tried percocet, muscle relaxers, and tylenol  without significant relief.  Patient denies urinary retention, fecal incontinence, saddle anesthesia, hx of cancer, fever, immunosuppression, IVDU, spinal procedure, significant trauma.   Review of Systems  Positive:  Negative:   Physical Exam  LMP 08/13/2012  Gen:   Awake, no distress   Resp:  Normal effort  MSK:   Moves extremities without difficulty  Other:    Medical Decision Making  Medically screening exam initiated at 2:24 PM.  Appropriate orders placed.  Deborah Knight was informed that the remainder of the evaluation will be completed by another provider, this initial triage assessment does not replace that evaluation, and the importance of remaining in the ED until their evaluation is complete.     Deborah Knight, [place] 03/08/24 1431

## 2024-03-08 NOTE — Telephone Encounter (Signed)
" °  FYI Only or Action Required?: FYI only for provider: Going to [facility] ED.  Patient was last seen in primary care on 02/24/2024 by Paseda, Folashade R, FNP.  Called Nurse Triage reporting Back Pain.  Symptoms began several days ago.  Interventions attempted: Prescription medications: oxycodone , heat.  Symptoms are: gradually worsening.  Triage Disposition: See HCP Within 4 Hours (Or PCP Triage)  Patient/caregiver understands and will follow disposition?: Yes  Copied from CRM #[id number]. Topic: Clinical - Red Word Triage >> Mar 08, 2024  9:22 AM Selinda RAMAN wrote: Red Word that prompted transfer to Nurse Triage: The patient called in stating she has been in excruciating pain from her back. She said she can barely get out of bed. She said this has been since the day before Christmas. She did go to the ER at [facility] on 12/27 for treatment and states she got a shot there and was given a small prescription for oxyCODONE -acetaminophen  (PERCOCET/ROXICET) 5-325 MG tablet which has not helped much other than to get some rest. I will transfer her to E2C2 NT Reason for Disposition  [1] SEVERE back pain (e.g., excruciating, unable to do any normal activities) AND [2] not improved 2 hours after pain medicine  Answer Assessment - Initial Assessment Questions Went to ED 12/27 for back pain. Was told PCP would have to order MRI. Going back today to [facility] ED as she doesn't want to go to [facility].     1. ONSET: When did the pain begin? (e.g., minutes, hours, days)     Thursday 2. LOCATION: Where does it hurt? (upper, mid or lower back)     lower 3. SEVERITY: How bad is the pain?  (e.g., Scale 1-10; mild, moderate, or severe)     severe 4. PATTERN: Is the pain constant? (e.g., yes, no; constant, intermittent)      constant 5. RADIATION: Does the pain shoot into your legs or somewhere else?     No shooting down legs today 6. CAUSE:  What do you think is causing the back pain?       unknown 7. BACK OVERUSE:  Any recent lifting of heavy objects, strenuous work or exercise?      8. MEDICINES: What have you taken so far for the pain? (e.g., nothing, acetaminophen , NSAIDS)     oxycodone  9. NEUROLOGIC SYMPTOMS: Do you have any weakness, numbness, or problems with bowel/bladder control?     Denies bowel/ bladder issues, weakness, numbness in ext 10. OTHER SYMPTOMS: Do you have any other symptoms? (e.g., fever, abdomen pain, burning with urination, blood in urine)       denies  Protocols used: Back Pain-A-AH  "

## 2024-03-08 NOTE — ED Triage Notes (Signed)
 Back pain. PA present for MSE. Both legs 'feel weak'. Denies loss of sensation.

## 2024-03-08 NOTE — ED Provider Notes (Signed)
 " [place] EMERGENCY DEPARTMENT AT [facility] Provider Note   CSN: [id number] Arrival date & time: 03/08/24  1326     Patient presents with: Back Pain   Deborah Knight is a 58 y.o. female with PMHx OA, chronic low back pain, GERD, HLD, HTN, asthma, who presents to ED concerned for low back pain and BL sciatica. Patient with hx of chronic low back pain which has worsened since last Thursday. Patient was seen at ED a couple days ago and prescribed muscle relaxers and percocet which is not providing much relief. Patient stating that the pain is making it difficult to walk.  Patient denies urinary retention, fecal incontinence, saddle anesthesia, lower extremity weakness, hx of cancer, fever, immunosuppression, IVDU, spinal procedure, significant trauma    Back Pain      Prior to Admission medications  Medication Sig Start Date End Date Taking? Authorizing Provider  acetaminophen  (TYLENOL ) 500 MG tablet Take 500 mg by mouth every 6 (six) hours as needed.    [provider]  albuterol  (VENTOLIN  HFA) 108 (90 Base) MCG/ACT inhaler Inhale 2 puffs into the lungs every 6 (six) hours as needed for wheezing. Shortness of breath 10/14/18   Vicenta Maduro, FNP  ALPRAZolam  (XANAX ) 0.25 MG tablet TAKE 1 TABLET BY MOUTH 2 TIMES A DAY AS NEEDED 11/07/23   Oley Bascom RAMAN, NP  amLODipine  (NORVASC ) 10 MG tablet Take 1 tablet (10 mg total) by mouth daily. 01/26/24   Paseda, Folashade R, FNP  busPIRone  (BUSPAR ) 15 MG tablet Take 1 tablet (15 mg total) by mouth 2 (two) times daily. 01/26/24   Paseda, Folashade R, FNP  cetirizine  (ZYRTEC ) 10 MG tablet Take 1 tablet (10 mg total) by mouth daily. 01/26/24   Paseda, Folashade R, FNP  clotrimazole  (LOTRIMIN ) 1 % cream Apply 1 Application topically 2 (two) times daily. 02/04/24   Paseda, Folashade R, FNP  fluconazole  (DIFLUCAN ) 150 MG tablet Take 1 tablet (150 mg total) by mouth once a week. 02/24/24   Paseda, Folashade R, FNP  fluticasone   (FLONASE ) 50 MCG/ACT nasal spray Place 2 sprays into both nostrils daily as needed (congestion). 06/10/23   Paseda, Folashade R, FNP  fluticasone -salmeterol (ADVAIR HFA) 115-21 MCG/ACT inhaler Inhale 2 puffs into the lungs 2 (two) times daily. 03/25/23   Paseda, Folashade R, FNP  gabapentin  (NEURONTIN ) 300 MG capsule Take 1 capsule (300 mg total) by mouth at bedtime. 02/24/24   Paseda, Folashade R, FNP  ibuprofen  (ADVIL ) 600 MG tablet Take 1 tablet (600 mg total) by mouth every 8 (eight) hours as needed. 06/10/23   Paseda, Folashade R, FNP  lidocaine  (LIDODERM ) 5 % Place 1 patch onto the skin daily. Remove & Discard patch within 12 hours or as directed by MD 03/06/24   Minnie Tinnie BRAVO, PA  methylPREDNISolone  (MEDROL  DOSEPAK) 4 MG TBPK tablet Take as instructed on the packaging 02/25/24   Paseda, Folashade R, FNP  nystatin  (MYCOSTATIN /NYSTOP ) powder Apply 1 Application topically 3 (three) times daily. 01/26/24   Paseda, Folashade R, FNP  omeprazole  (PRILOSEC) 20 MG capsule TAKE 1 CAPSULE BY MOUTH DAILY 02/19/24   Paseda, Folashade R, FNP  oxyCODONE -acetaminophen  (PERCOCET/ROXICET) 5-325 MG tablet Take 1 tablet by mouth every 6 (six) hours as needed for severe pain (pain score 7-10). 03/06/24   Patsey Lot, MD  spironolactone  (ALDACTONE ) 25 MG tablet Take 0.5 tablets (12.5 mg total) by mouth daily. 01/26/24   Paseda, Folashade R, FNP  traZODone  (DESYREL ) 50 MG tablet Take 0.5-1 tablets (25-50 mg total) by  mouth at bedtime as needed for sleep. 01/26/24   Paseda, Folashade R, FNP  valACYclovir  (VALTREX ) 1000 MG tablet Take 1 tablet (1,000 mg total) by mouth daily. 01/26/24   Paseda, Folashade R, FNP    Allergies: Losartan , Shellfish protein-containing drug products, Toradol  [ketorolac  tromethamine ], and Latex    Review of Systems  Musculoskeletal:  Positive for back pain.    Updated Vital Signs BP (!) 129/90 (BP Location: Left Arm)   Pulse 98   Temp 98.5 F (36.9 C) (Oral)   Resp 18   LMP  08/13/2012   SpO2 98%   Physical Exam Vitals and nursing note reviewed.  Constitutional:      General: She is not in acute distress.    Appearance: She is not ill-appearing or toxic-appearing.  HENT:     Head: Normocephalic and atraumatic.     Mouth/Throat:     Mouth: Mucous membranes are moist.  Eyes:     General: No scleral icterus.       Right eye: No discharge.        Left eye: No discharge.     Conjunctiva/sclera: Conjunctivae normal.  Cardiovascular:     Rate and Rhythm: Normal rate.     Pulses: Normal pulses.     Heart sounds: No murmur heard. Pulmonary:     Effort: Pulmonary effort is normal.  Abdominal:     Tenderness: There is no abdominal tenderness.  Musculoskeletal:     Right lower leg: No edema.     Left lower leg: No edema.     Comments: Tenderness to palpation across entire low back. No saddle anesthesia. +2 pedal pulses. No swelling or erythema or crepitus or step-offs appreciated of lumbar spine.   Skin:    General: Skin is warm and dry.     Findings: No rash.  Neurological:     General: No focal deficit present.     Mental Status: She is alert and oriented to person, place, and time. Mental status is at baseline.  Psychiatric:        Mood and Affect: Mood normal.     (all labs ordered are listed, but only abnormal results are displayed) Labs Reviewed - No data to display  EKG: None  Radiology: CT Lumbar Spine Wo Contrast Result Date: 03/08/2024 EXAM: CT OF THE LUMBAR SPINE WITHOUT CONTRAST 03/08/2024 03:29:00 PM TECHNIQUE: CT of the lumbar spine was performed without the administration of intravenous contrast. Multiplanar reformatted images are provided for review. Automated exposure control, iterative reconstruction, and/or weight based adjustment of the mA/kV was utilized to reduce the radiation dose to as low as reasonably achievable. COMPARISON: CT abdomen and pelvis 10/18/2014. CLINICAL HISTORY: Low back pain, symptoms persist with > 6 wks  treatment. Bilateral leg weakness. FINDINGS: BONES AND ALIGNMENT: 5 lumbar type vertebrae. Mild lumbar dextroscoliosis. No significant listhesis. No acute fracture or suspicious bone lesion. DEGENERATIVE CHANGES: Prominent degenerative endplate sclerosis associated with severe disc space narrowing and vacuum disc phenomenon at L5-S1, similar to the prior study. Disc bulging, disc space height loss, and mild facet arthrosis at L5-S1 result in moderate right and mild left neural foraminal stenosis without evidence of significant spinal stenosis. Mild disc bulging is present at other lumbar levels without significant disc space height loss or evidence of significant stenosis. SOFT TISSUES: No acute abnormality. IMPRESSION: 1. No acute osseous abnormality. 2. 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 No significant spinal canal stenosis. Electronically signed  by: Dasie Hamburg MD 03/08/2024 03:50 PM EST RP Workstation: [id number]     Procedures   Medications Ordered in the ED  lidocaine  (LIDODERM ) 5 % 1 patch (1 patch Transdermal Patch Applied 03/08/24 1437)  acetaminophen  (TYLENOL ) tablet 1,000 mg (1,000 mg Oral Given 03/08/24 1431)  methocarbamol  (ROBAXIN ) tablet 500 mg (500 mg Oral Given 03/08/24 1431)  dexamethasone  (DECADRON ) injection 8 mg (8 mg Intramuscular Given 03/08/24 1436)                                    Medical Decision Making Amount and/or Complexity of Data Reviewed Radiology: ordered.  Risk OTC drugs. Prescription drug management.   This patient presents to the ED for concern of back pain, this involves an extensive number of treatment options, and is a complaint that carries with it a high risk of complications and morbidity.  The differential diagnosis includes pyelonephritis, nephrolithiasis, spinal abscess, osteomyelitis, herniated disc, muscle strain, spinal fracture, meningitis, cancer, cauda equina  syndrome.   Co morbidities that complicate the patient evaluation  OA, chronic low back pain, GERD, HLD, HTN, asthma,   Additional history obtained:  Additional history obtained from 12/27 ED note   Problem List / ED Course / Critical interventions / Medication management  Patient presents to ED concerned for acute on chronic low back pain with sciatica to BL sides. Physical exam with tenderness to entire lower back. Rest of physical exam reassuring. Patient afebrile with stable vitals.  I ordered imaging studies including CT lumbar spine. I independently visualized and interpreted imaging which showed no acute process. I agree with the radiologist interpretation. Provided patient with back pain cocktail which did provide some relief. Shared all results. Answered all questions. Recommended following up with PCP, ortho, and neurosurgery. Patient upset that we are not able to give further help such as steroid injections into the spine. I educated patient that ortho or neurosurgery will be able to help with that. Patient stating that she already has plenty of medications at home for the pain and does not need any further prescriptions today. I have reviewed the patients home medicines and have made adjustments as needed The patient has been appropriately medically screened and/or stabilized in the ED. I have low suspicion for any other emergent medical condition which would require further screening, evaluation or treatment in the ED or require inpatient management. At time of discharge the patient is hemodynamically stable and in no acute distress. I have discussed work-up results and diagnosis with patient and answered all questions. Patient is agreeable with discharge plan. We discussed strict return precautions for returning to the emergency department and they verbalized understanding.    Ddx: these are considered less likely due to history of present illness and physical  exam -Pyelonephritis/nephrolithiasis: no abdominal or flank pain; no urinary symptoms -spinal abscess: no fever, no skin findings, no history of IVDU -osteomyelitis: no history of IVDU; pain is acute -spinal fracture: not a high force trauma associated with pain -meningitis: no fever; lack of meningism symptoms -cancer: symptoms are acute -cauda equina syndrome: denies saddle paresthesia, urinary retention, fecal incontinence   Social Determinants of Health:  none      Final diagnoses:  Chronic bilateral low back pain with bilateral sciatica    ED Discharge Orders     None          Hoy [name], [place] 03/08/24 2013  "

## 2024-03-08 NOTE — Discharge Instructions (Signed)
 Please follow up with orthopedics and neurosurgery. Seek emergency care if experiencing any new or worsening symptoms.  Alternating between 650 mg Tylenol  and 400 mg Advil : The best way to alternate taking Acetaminophen  (example Tylenol ) and Ibuprofen  (example Advil /Motrin ) is to take them 3 hours apart. For example, if you take ibuprofen  at 6 am you can then take Tylenol  at 9 am. You can continue this regimen throughout the day, making sure you do not exceed the recommended maximum dose for each drug.

## 2024-03-08 NOTE — ED Notes (Signed)
 Reviewed AVS/discharge instruction with patient. Time allotted for and all questions answered. Patient is agreeable for d/c and escorted to ed exit by staff.

## 2024-03-09 ENCOUNTER — Telehealth: Payer: Self-pay | Admitting: Licensed Clinical Social Worker

## 2024-03-16 ENCOUNTER — Ambulatory Visit: Admitting: Family Medicine

## 2024-03-16 VITALS — BP 110/70 | HR 97 | Ht 63.0 in | Wt 214.0 lb

## 2024-03-16 DIAGNOSIS — G8929 Other chronic pain: Secondary | ICD-10-CM

## 2024-03-16 DIAGNOSIS — M545 Low back pain, unspecified: Secondary | ICD-10-CM

## 2024-03-16 NOTE — Patient Instructions (Signed)
Thank you for coming in today.   I've referred you to Physical Therapy.  Let us know if you don't hear from them in one week.   Check back in 2 months

## 2024-03-16 NOTE — Progress Notes (Unsigned)
 "        I, Claretha Schimke am a scribe for Dr. Artist Lloyd, MD.  Deborah Knight is a 59 y.o. female who presents to [facility] today for LBP ongoing for years, worsening right before Christmas. Got a steroid injection right before Christmas. She was seen at the ED on 12/27 and again on 12/29. After the second injection at the hospital the pain finally started to ease up. Sunday she felt a little better. The pain radiated up her right side and up her spine. The pain is still there today. Dealing with bask spasms for a long time. Pain is a 6 out of 10 today. Pt locates pain to low back.  Radiating pain: up to center of the spine, down the legs LE numbness/tingling:yes numbness in leg and feet LE weakness: yes sometimes Aggravates: sleeping, sitting too long, standing too long, walking distances Treatments tried: prednisone , toradol  IM injection, Tylenol , methocarbamol , lidocaine  patches, percocet, heating pad, muscle relaxer  Dx testing: 03/08/24 L-spine CT  Pertinent review of systems: No fevers or chills  Relevant historical information: Previous foot and shoulder surgeries   Exam:  BP 110/70   Pulse 97   Ht 5' 3 (1.6 m)   Wt 214 lb (97.1 kg)   LMP 08/13/2012   SpO2 98%   BMI 37.91 kg/m  General: Well Developed, well nourished, and in no acute distress.   MSK: L-spine nontender palpation spinal midline.  Tender palpation right SI joint area.  Decreased lumbar motion.  Lower extremity strength is intact.    Lab and Radiology Results  EXAM: CT OF THE LUMBAR SPINE WITHOUT CONTRAST 03/08/2024 03:29:00 PM   TECHNIQUE: CT of the lumbar spine was performed without the administration of intravenous contrast. Multiplanar reformatted images are provided for review. Automated exposure control, iterative reconstruction, and/or weight based adjustment of the mA/kV was utilized to reduce the radiation dose to as low as reasonably achievable.    COMPARISON: CT abdomen and pelvis 10/18/2014.   CLINICAL HISTORY: Low back pain, symptoms persist with > 6 wks treatment. Bilateral leg weakness.   FINDINGS:   BONES AND ALIGNMENT: 5 lumbar type vertebrae. Mild lumbar dextroscoliosis. No significant listhesis. No acute fracture or suspicious bone lesion.   DEGENERATIVE CHANGES: Prominent degenerative endplate sclerosis associated with severe disc space narrowing and vacuum disc phenomenon at L5-S1, similar to the prior study. Disc bulging, disc space height loss, and mild facet arthrosis at L5-S1 result in moderate right and mild left neural foraminal stenosis without evidence of significant spinal stenosis. Mild disc bulging is present at other lumbar levels without significant disc space height loss or evidence of significant stenosis.   SOFT TISSUES: No acute abnormality.   IMPRESSION: 1. No acute osseous abnormality. 2. 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 No significant spinal canal stenosis.   Electronically signed by: Dasie Hamburg MD 03/08/2024 03:50 PM EST RP Workstation: [id number]   I, Artist Lloyd, personally (independently) visualized and performed the interpretation of the images attached in this note. 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    Assessment and Plan: 59 y.o. female with right low back pain multifactorial.  She had a flareup of her chronic pain due to muscle dysfunction and spasm.  This should be treated with physical therapy.  Plan refer to PT.  Additionally she has degenerative changes in the lumbar spine around L5-S1 that certainly could be a  big source of pain as well as SI joint degenerative changes that could be a source of pain.  Plan for trial of PT and check back in 2 months.  If not better consider MRI for advanced injection planning or potential SI joint injection in my clinic.   PDMP not  reviewed this encounter. Orders Placed This Encounter  Procedures   Ambulatory referral to Physical Therapy    Referral Priority:   Routine    Referral Type:   Physical Medicine    Referral Reason:   Specialty Services Required    Requested Specialty:   Physical Therapy    Number of Visits Requested:   1   No orders of the defined types were placed in this encounter.    Discussed warning signs or symptoms. Please see discharge instructions. Patient expresses understanding.   The above documentation has been reviewed and is accurate and complete Artist Lloyd, M.D.   "

## 2024-03-18 NOTE — Telephone Encounter (Signed)
 Called pt back . She was advise to follow up with ortho for pain. Kh

## 2024-03-22 ENCOUNTER — Telehealth: Payer: Self-pay | Admitting: *Deleted

## 2024-03-22 NOTE — Progress Notes (Signed)
 Complex Care Management Care Guide Note  03/22/2024 Name: Harmani Neto MRN: [id number] DOB: Aug 05, 1965  Michale Weikel is a 59 y.o. year old female who is a primary care patient of Paseda, Folashade R, FNP and is actively engaged with the care management team. I reached out to Rock Downy by phone today to assist with re-scheduling  with the Licensed Clinical Child Psychotherapist.  Follow up plan: Unsuccessful telephone outreach attempt made. A HIPAA compliant phone message was left for the patient providing contact information and requesting a return call.  [name]  [facility]  Value-Based Care Institute, [facility] Guide  Direct Dial: [phone]  Fax [phone]

## 2024-03-23 ENCOUNTER — Telehealth: Admitting: Licensed Clinical Social Worker

## 2024-03-23 ENCOUNTER — Telehealth: Payer: Self-pay | Admitting: Nurse Practitioner

## 2024-03-23 NOTE — Telephone Encounter (Signed)
 Patient has ortho referral for sciatica and shoulder pain. I do not see that she was seen. LMOM for pt to return call and see if she was seen or if she still needs to be seen.

## 2024-03-25 NOTE — Progress Notes (Signed)
 Complex Care Management Care Guide Note  03/25/2024 Name: [name] Comp MRN: [id number] DOB: 12/14/1965  Deborah Knight is a 59 y.o. year old female who is a primary care patient of Paseda, Folashade R, FNP and is actively engaged with the care management team. I reached out to Rock Downy by phone today to assist with re-scheduling  with the Licensed Clinical Child Psychotherapist.  Follow up plan: Unsuccessful telephone outreach attempt made. A HIPAA compliant phone message was left for the patient providing contact information and requesting a return call.   [name]  [facility]  Value-Based Care Institute, [facility] Guide  Direct Dial: [phone]  Fax [phone]

## 2024-03-26 ENCOUNTER — Telehealth: Payer: Self-pay | Admitting: *Deleted

## 2024-03-26 NOTE — Progress Notes (Signed)
 Complex Care Management Care Guide Note  03/26/2024 Name: Deborah Knight MRN: [id number] DOB: 06/16/1965  Deborah Knight is a 59 y.o. year old female who is a primary care patient of Paseda, Folashade R, FNP and is actively engaged with the care management team. I reached out to Rock Downy by phone today to assist with re-scheduling  with the Licensed Clinical Child Psychotherapist.  Follow up plan: Telephone appointment with complex care management team member scheduled for:  04/27/24  Deborah Knight  [facility]  Value-Based Care Institute, [facility] Guide  Direct Dial: [phone]  Fax [phone]

## 2024-03-26 NOTE — Progress Notes (Signed)
 Complex Care Management Care Guide Note  03/26/2024 Name: Deborah Knight MRN: [id number] DOB: 07/14/1965  Deborah Knight is a 59 y.o. year old female who is a primary care patient of Paseda, Folashade R, FNP and is actively engaged with the care management team. I reached out to Rock Downy by phone today to assist with re-scheduling  with the Licensed Clinical Child Psychotherapist.  Follow up plan: Unsuccessful telephone outreach attempt made. A HIPAA compliant phone message was left for the patient providing contact information and requesting a return call. No further outreach attempts will be made at this time. We have been unable to contact the patient to reschedule for complex care management services.   [name]  [facility]  Value-Based Care Institute, [facility] Guide  Direct Dial: [phone]  Fax [phone]

## 2024-04-27 ENCOUNTER — Telehealth: Admitting: Licensed Clinical Social Worker

## 2024-05-11 ENCOUNTER — Ambulatory Visit: Admitting: Family Medicine

## 2024-06-30 ENCOUNTER — Encounter: Payer: Self-pay | Admitting: Nurse Practitioner
